# Patient Record
Sex: Female | Born: 1955 | Race: White | Hispanic: No | Smoking: Never smoker
Health system: Southern US, Community
[De-identification: ages and names within clinical notes are randomized; demographics above are authoritative.]

## PROBLEM LIST (undated history)

## (undated) DIAGNOSIS — E559 Vitamin D deficiency, unspecified: Secondary | ICD-10-CM

## (undated) DIAGNOSIS — K2970 Gastritis, unspecified, without bleeding: Secondary | ICD-10-CM

## (undated) DIAGNOSIS — T7840XA Allergy, unspecified, initial encounter: Secondary | ICD-10-CM

## (undated) DIAGNOSIS — F411 Generalized anxiety disorder: Secondary | ICD-10-CM

## (undated) DIAGNOSIS — K589 Irritable bowel syndrome without diarrhea: Secondary | ICD-10-CM

## (undated) DIAGNOSIS — M1990 Unspecified osteoarthritis, unspecified site: Secondary | ICD-10-CM

## (undated) DIAGNOSIS — E669 Obesity, unspecified: Secondary | ICD-10-CM

## (undated) DIAGNOSIS — K5790 Diverticulosis of intestine, part unspecified, without perforation or abscess without bleeding: Secondary | ICD-10-CM

## (undated) DIAGNOSIS — E039 Hypothyroidism, unspecified: Secondary | ICD-10-CM

## (undated) DIAGNOSIS — Z8669 Personal history of other diseases of the nervous system and sense organs: Secondary | ICD-10-CM

## (undated) DIAGNOSIS — B9681 Helicobacter pylori [H. pylori] as the cause of diseases classified elsewhere: Secondary | ICD-10-CM

## (undated) DIAGNOSIS — R0989 Other specified symptoms and signs involving the circulatory and respiratory systems: Secondary | ICD-10-CM

## (undated) DIAGNOSIS — K219 Gastro-esophageal reflux disease without esophagitis: Secondary | ICD-10-CM

## (undated) DIAGNOSIS — M797 Fibromyalgia: Secondary | ICD-10-CM

## (undated) DIAGNOSIS — M542 Cervicalgia: Secondary | ICD-10-CM

## (undated) DIAGNOSIS — G4700 Insomnia, unspecified: Secondary | ICD-10-CM

## (undated) HISTORY — DX: Cervicalgia: M54.2

## (undated) HISTORY — PX: BREAST ENHANCEMENT SURGERY: SHX7

## (undated) HISTORY — DX: Gastritis, unspecified, without bleeding: K29.70

## (undated) HISTORY — DX: Hypothyroidism, unspecified: E03.9

## (undated) HISTORY — DX: Helicobacter pylori (H. pylori) as the cause of diseases classified elsewhere: B96.81

## (undated) HISTORY — DX: Other specified symptoms and signs involving the circulatory and respiratory systems: R09.89

## (undated) HISTORY — DX: Allergy, unspecified, initial encounter: T78.40XA

## (undated) HISTORY — PX: TONSILLECTOMY: SUR1361

## (undated) HISTORY — DX: Unspecified osteoarthritis, unspecified site: M19.90

## (undated) HISTORY — PX: OTHER SURGICAL HISTORY: SHX169

## (undated) HISTORY — DX: Vitamin D deficiency, unspecified: E55.9

## (undated) HISTORY — DX: Personal history of other diseases of the nervous system and sense organs: Z86.69

## (undated) HISTORY — DX: Insomnia, unspecified: G47.00

## (undated) HISTORY — PX: COLONOSCOPY: SHX174

## (undated) HISTORY — DX: Irritable bowel syndrome, unspecified: K58.9

## (undated) HISTORY — DX: Generalized anxiety disorder: F41.1

## (undated) HISTORY — DX: Gastro-esophageal reflux disease without esophagitis: K21.9

## (undated) HISTORY — DX: Fibromyalgia: M79.7

## (undated) HISTORY — DX: Obesity, unspecified: E66.9

## (undated) HISTORY — DX: Diverticulosis of intestine, part unspecified, without perforation or abscess without bleeding: K57.90

---
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
## 1998-07-19 ENCOUNTER — Other Ambulatory Visit: Admission: RE | Admit: 1998-07-19 | Discharge: 1998-07-19 | Payer: Self-pay | Admitting: Radiology

## 1998-07-23 ENCOUNTER — Ambulatory Visit (HOSPITAL_COMMUNITY): Admission: RE | Admit: 1998-07-23 | Discharge: 1998-07-23 | Payer: Self-pay | Admitting: Gastroenterology

## 2002-03-13 ENCOUNTER — Other Ambulatory Visit: Admission: RE | Admit: 2002-03-13 | Discharge: 2002-03-13 | Payer: Self-pay | Admitting: *Deleted

## 2002-05-01 ENCOUNTER — Ambulatory Visit (HOSPITAL_COMMUNITY): Admission: RE | Admit: 2002-05-01 | Discharge: 2002-05-02 | Payer: Self-pay | Admitting: Surgery

## 2002-05-01 ENCOUNTER — Encounter (INDEPENDENT_AMBULATORY_CARE_PROVIDER_SITE_OTHER): Payer: Self-pay | Admitting: *Deleted

## 2003-03-26 ENCOUNTER — Other Ambulatory Visit: Admission: RE | Admit: 2003-03-26 | Discharge: 2003-03-26 | Payer: Self-pay | Admitting: *Deleted

## 2003-09-18 ENCOUNTER — Ambulatory Visit (HOSPITAL_COMMUNITY): Admission: RE | Admit: 2003-09-18 | Discharge: 2003-09-18 | Payer: Self-pay | Admitting: Gastroenterology

## 2004-03-29 ENCOUNTER — Other Ambulatory Visit: Admission: RE | Admit: 2004-03-29 | Discharge: 2004-03-29 | Payer: Self-pay | Admitting: Obstetrics and Gynecology

## 2005-03-02 ENCOUNTER — Ambulatory Visit: Payer: Self-pay | Admitting: Pulmonary Disease

## 2005-03-07 ENCOUNTER — Other Ambulatory Visit: Admission: RE | Admit: 2005-03-07 | Discharge: 2005-03-07 | Payer: Self-pay | Admitting: Obstetrics and Gynecology

## 2007-07-09 ENCOUNTER — Ambulatory Visit: Payer: Self-pay | Admitting: Pulmonary Disease

## 2007-07-09 LAB — CONVERTED CEMR LAB
ALT: 26 units/L (ref 0–35)
Albumin: 3.8 g/dL (ref 3.5–5.2)
BUN: 12 mg/dL (ref 6–23)
Basophils Relative: 0.9 % (ref 0.0–1.0)
Bilirubin, Direct: 0.2 mg/dL (ref 0.0–0.3)
Cholesterol: 192 mg/dL (ref 0–200)
Creatinine, Ser: 0.8 mg/dL (ref 0.4–1.2)
Eosinophils Relative: 2.1 % (ref 0.0–5.0)
GFR calc Af Amer: 96 mL/min
GFR calc non Af Amer: 80 mL/min
Glucose, Bld: 98 mg/dL (ref 70–99)
HDL: 54.2 mg/dL (ref >39.0)
Hemoglobin, Urine: NEGATIVE
MCHC: 34.2 g/dL (ref 30.0–36.0)
MCV: 92.2 fL (ref 78.0–100.0)
Monocytes Absolute: 0.5 10*3/uL (ref 0.2–0.7)
Monocytes Relative: 6.5 % (ref 3.0–11.0)
Mucus, UA: NEGATIVE
Potassium: 4.1 meq/L (ref 3.5–5.1)
RBC: 4.14 M/uL (ref 3.87–5.11)
Sodium: 140 meq/L (ref 135–145)
Specific Gravity, Urine: 1.01 (ref 1.000–1.03)
TSH: 2.74 microintl units/mL (ref 0.35–5.50)
Total CHOL/HDL Ratio: 3.5
Total Protein, Urine: NEGATIVE mg/dL
Total Protein: 6.5 g/dL (ref 6.0–8.3)
Urobilinogen, UA: 0.2 (ref 0.0–1.0)
VLDL: 20 mg/dL (ref 0–40)

## 2007-09-05 ENCOUNTER — Telehealth: Payer: Self-pay | Admitting: Pulmonary Disease

## 2007-09-06 ENCOUNTER — Encounter: Payer: Self-pay | Admitting: Pulmonary Disease

## 2007-09-06 DIAGNOSIS — K589 Irritable bowel syndrome without diarrhea: Secondary | ICD-10-CM | POA: Insufficient documentation

## 2007-09-06 DIAGNOSIS — IMO0001 Reserved for inherently not codable concepts without codable children: Secondary | ICD-10-CM | POA: Insufficient documentation

## 2007-09-06 DIAGNOSIS — Z8719 Personal history of other diseases of the digestive system: Secondary | ICD-10-CM | POA: Insufficient documentation

## 2007-09-06 DIAGNOSIS — F419 Anxiety disorder, unspecified: Secondary | ICD-10-CM

## 2007-09-27 ENCOUNTER — Telehealth (INDEPENDENT_AMBULATORY_CARE_PROVIDER_SITE_OTHER): Payer: Self-pay | Admitting: *Deleted

## 2007-10-07 ENCOUNTER — Encounter: Payer: Self-pay | Admitting: Pulmonary Disease

## 2007-10-30 ENCOUNTER — Encounter: Payer: Self-pay | Admitting: Pulmonary Disease

## 2007-10-31 ENCOUNTER — Encounter: Payer: Self-pay | Admitting: Pulmonary Disease

## 2007-11-08 ENCOUNTER — Telehealth (INDEPENDENT_AMBULATORY_CARE_PROVIDER_SITE_OTHER): Payer: Self-pay | Admitting: *Deleted

## 2007-11-08 ENCOUNTER — Ambulatory Visit: Payer: Self-pay | Admitting: Pulmonary Disease

## 2007-11-08 DIAGNOSIS — J069 Acute upper respiratory infection, unspecified: Secondary | ICD-10-CM | POA: Insufficient documentation

## 2008-01-28 ENCOUNTER — Encounter: Payer: Self-pay | Admitting: Pulmonary Disease

## 2008-02-12 ENCOUNTER — Telehealth (INDEPENDENT_AMBULATORY_CARE_PROVIDER_SITE_OTHER): Payer: Self-pay | Admitting: *Deleted

## 2008-02-20 ENCOUNTER — Ambulatory Visit: Payer: Self-pay | Admitting: Internal Medicine

## 2008-02-20 DIAGNOSIS — G4700 Insomnia, unspecified: Secondary | ICD-10-CM | POA: Insufficient documentation

## 2008-02-25 DIAGNOSIS — M542 Cervicalgia: Secondary | ICD-10-CM

## 2008-02-28 ENCOUNTER — Telehealth (INDEPENDENT_AMBULATORY_CARE_PROVIDER_SITE_OTHER): Payer: Self-pay | Admitting: *Deleted

## 2008-03-20 ENCOUNTER — Ambulatory Visit: Payer: Self-pay | Admitting: Internal Medicine

## 2008-03-30 ENCOUNTER — Telehealth (INDEPENDENT_AMBULATORY_CARE_PROVIDER_SITE_OTHER): Payer: Self-pay | Admitting: *Deleted

## 2008-03-31 ENCOUNTER — Telehealth: Payer: Self-pay | Admitting: Adult Health

## 2008-05-15 ENCOUNTER — Encounter: Payer: Self-pay | Admitting: Pulmonary Disease

## 2008-05-15 ENCOUNTER — Telehealth: Payer: Self-pay | Admitting: Pulmonary Disease

## 2008-06-02 ENCOUNTER — Encounter: Payer: Self-pay | Admitting: Pulmonary Disease

## 2008-06-28 ENCOUNTER — Encounter
Admission: RE | Admit: 2008-06-28 | Discharge: 2008-06-28 | Payer: Self-pay | Admitting: Physical Medicine and Rehabilitation

## 2008-08-25 ENCOUNTER — Encounter: Payer: Self-pay | Admitting: Pulmonary Disease

## 2010-02-09 ENCOUNTER — Telehealth (INDEPENDENT_AMBULATORY_CARE_PROVIDER_SITE_OTHER): Payer: Self-pay | Admitting: *Deleted

## 2010-03-07 ENCOUNTER — Telehealth: Payer: Self-pay | Admitting: Pulmonary Disease

## 2010-03-09 ENCOUNTER — Ambulatory Visit: Payer: Self-pay | Admitting: Pulmonary Disease

## 2010-03-09 DIAGNOSIS — E039 Hypothyroidism, unspecified: Secondary | ICD-10-CM | POA: Insufficient documentation

## 2010-03-13 DIAGNOSIS — G43909 Migraine, unspecified, not intractable, without status migrainosus: Secondary | ICD-10-CM | POA: Insufficient documentation

## 2010-03-13 DIAGNOSIS — R0989 Other specified symptoms and signs involving the circulatory and respiratory systems: Secondary | ICD-10-CM | POA: Insufficient documentation

## 2010-03-13 DIAGNOSIS — E042 Nontoxic multinodular goiter: Secondary | ICD-10-CM

## 2010-03-13 DIAGNOSIS — E559 Vitamin D deficiency, unspecified: Secondary | ICD-10-CM

## 2010-03-13 DIAGNOSIS — E785 Hyperlipidemia, unspecified: Secondary | ICD-10-CM

## 2010-03-13 LAB — CONVERTED CEMR LAB
AST: 16 units/L (ref 0–37)
Albumin: 4 g/dL (ref 3.5–5.2)
Alkaline Phosphatase: 52 units/L (ref 39–117)
BUN: 9 mg/dL (ref 6–23)
Basophils Absolute: 0 10*3/uL (ref 0.0–0.1)
Basophils Relative: 0.4 % (ref 0.0–3.0)
Chloride: 108 meq/L (ref 96–112)
Creatinine, Ser: 0.7 mg/dL (ref 0.4–1.2)
HCT: 36.5 % (ref 36.0–46.0)
Hemoglobin, Urine: NEGATIVE
Hemoglobin: 12.6 g/dL (ref 12.0–15.0)
Lymphs Abs: 2.7 10*3/uL (ref 0.7–4.0)
MCHC: 34.5 g/dL (ref 30.0–36.0)
Monocytes Relative: 7.1 % (ref 3.0–12.0)
Platelets: 195 10*3/uL (ref 150.0–400.0)
TSH: 1.56 microintl units/mL (ref 0.35–5.50)
Total Bilirubin: 1 mg/dL (ref 0.3–1.2)
Total Protein, Urine: NEGATIVE mg/dL
Total Protein: 6.1 g/dL (ref 6.0–8.3)
VLDL: 11.6 mg/dL (ref 0.0–40.0)
Vit D, 25-Hydroxy: 24 ng/mL — ABNORMAL LOW (ref 30–89)

## 2010-04-12 ENCOUNTER — Telehealth (INDEPENDENT_AMBULATORY_CARE_PROVIDER_SITE_OTHER): Payer: Self-pay | Admitting: *Deleted

## 2010-11-01 NOTE — Progress Notes (Signed)
Summary: set up labs  Phone Note Call from Patient Call back at Home Phone [phone]   Caller: Patient Call For: Majesty Stehlin Summary of Call: pt calling to have labs put in for cpx that is scheduled for wed.  Initial call taken by: Tivis Ringer, CNA,  March 07, 2010 11:39 AM  Follow-up for Phone Call        please advise on labs to enter. thanks.Carron Curie CMA  March 07, 2010 11:57 AM    called and lmom for pt to make her aware of labs placed in computer for 6-7---pt to call for any questions or concerns. Randell Loop CMA  March 07, 2010 4:45 PM

## 2010-11-01 NOTE — Assessment & Plan Note (Signed)
Summary: cpx/ mbw   CC:  32 month ROV & CPX....  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   Current Problems:   PHYSICAL EXAMINATION (ICD-V70.0):  ~  GI:  Prev saw DrWeissman, now [employer] & she will call to set up colonoscopy...  ~  GYN:  DrMeisinger, due for f/u GYN check up & Mammograms at [facility]...  ~  Immunizations:    CAROTID BRUIT (ICD-785.9) - faint carotid bruits on exam & CDoppler6/03 showed mild plaque w/o signif incr velocities, mild tortuosity may be source of bruit- 0-39% bilat ICA stenoses  HYPERCHOLESTEROLEMIA, BORDERLINE (ICD-272.4) - she has hx elevated LDL chol on diet alone, not able to exercise due to demands on her time from mother's illness...  ~  FLP 10/08 showed TChol 192, TG 99, HDL 54, LDL 118... discussed low fat diet.  ~  FLP 6/11 showed TChol 159, TG 58, HDL 56, LDL 92... improved on diet> continue same.  NONTOXIC MULTINODULAR GOITER (ICD-241.1), HYPOTHYROIDISM (ICD-244.9) - followed by DrSouth for Endocrinology on SYNTHROID 47mcg/d & CYTOMEL89mcg- 1/2 daily... she had a hemithyroidectomy at age 19 (1973) for a hot nodule...  OVERWEIGHT (ICD-278.02) - we discussed diet + exercise program...  ~  weight 10/08 = 229#  ~  weight 6/11 = 219#  IRRITABLE BOWEL SYNDROME, HX OF (ICD-V12.79) & Family Hx of COLON CANCER (ICD-153.9) - she was followed for yrs by Leodis Rains, now seeing DrGessner and f/u colonoscopy pending...  NECK PAIN (ICD-723.1) - eval by DrNewton et al w/ DDD on SKELAXIN 800mg  Tid Prn...  FIBROMYALGIA (ICD-729.1) - prev eval by Rheum- DrDeveshwar on Prn Skelaxin, Flector, Ambien... she notes "I do better off the whites"... she has had trochanteric bursitis w/ injection in the past... she's had coccyodynia in the past... she's been seen by [facility] in the past... hx left lateral malleolus fx 2008 after fall (DrBednarz)...  VITAMIN D DEFICIENCY (ICD-268.9) - prev on  Vit D 50K weekly per DrSouth, now on 1000 u daily OTC...   ~  labs 6/11 showed Vit D level = 24... rec> incr to 2000 u daily...  MIGRAINE HEADACHE (ICD-346.90) - uses RELPAX 20mg  Prn for migraine HA's...  ANXIETY (ICD-300.00) - on ATIVAN 1mg  Bid as needed for nerves... she is under considerable stress caring for her elderly mother...  INSOMNIA, CHRONIC (ICD-307.42)  DERM - hx mult dysplastic nevi removed by DrDJones...   Preventive Screening-Counseling & Management  Alcohol-Tobacco     Smoking Status: never  Allergies (verified): No Known Drug Allergies  Comments:  Nurse/Medical Assistant: The patient's medications and allergies were reviewed with the patient and were updated in the Medication and Allergy Lists.  Past History:  Past Medical History: CAROTID BRUIT (ICD-785.9) HYPERCHOLESTEROLEMIA, BORDERLINE (ICD-272.4) NONTOXIC MULTINODULAR GOITER (ICD-241.1) HYPOTHYROIDISM (ICD-244.9) OVERWEIGHT (ICD-278.02) IRRITABLE BOWEL SYNDROME, HX OF (ICD-V12.79) Family Hx of COLON CANCER (ICD-153.9) NECK PAIN (ICD-723.1) FIBROMYALGIA (ICD-729.1) VITAMIN D DEFICIENCY (ICD-268.9) MIGRAINE HEADACHE (ICD-346.90) ANXIETY (ICD-300.00) INSOMNIA, CHRONIC (ICD-307.42)  Past Surgical History: S/P right thyroidectomy 1973 for toxic nodule S/P breast implants  Family History: Reviewed history from 02/20/2008 and no changes required. Family history of allergies, asthma, and breast cancer in mother Father deceased at age 71 due to colon cancer Mother alive in her 32's w/ hx breast cancer 1 Sibling, sister, hx colon polyp  Social History: Reviewed history from 02/20/2008 and no changes required. 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  The patient denies fever, chills, sweats, anorexia, fatigue, weakness, malaise, weight loss, sleep disorder,  blurring, diplopia, eye irritation, eye discharge, vision loss, eye pain, photophobia, earache, ear discharge, tinnitus, decreased hearing, nasal congestion, nosebleeds, sore throat, hoarseness, chest pain, palpitations, syncope, dyspnea on exertion, orthopnea, PND, peripheral edema, cough, dyspnea at rest, excessive sputum, hemoptysis, wheezing, pleurisy, nausea, vomiting, diarrhea, constipation, change in bowel habits, abdominal pain, melena, hematochezia, jaundice, gas/bloating, indigestion/heartburn, dysphagia, odynophagia, dysuria, hematuria, urinary frequency, urinary hesitancy, nocturia, incontinence, back pain, joint pain, joint swelling, muscle cramps, muscle weakness, stiffness, arthritis, sciatica, restless legs, leg pain at night, leg pain with exertion, rash, itching, dryness, suspicious lesions, paralysis, paresthesias, seizures, tremors, vertigo, transient blindness, frequent falls, frequent headaches, difficulty walking, depression, memory loss, confusion, cold intolerance, heat intolerance, polydipsia, polyphagia, polyuria, unusual weight change, abnormal bruising, bleeding, enlarged lymph nodes, urticaria, allergic rash, hay fever, and recurrent infections.    Vital Signs:  Patient profile:   55 year old female Height:      67 inches Weight:      219 pounds BMI:     34.42 O2 Sat:      98 % on Room air Temp:     98.3 degrees F oral Pulse rate:   65 / minute BP sitting:   112 / 70  (left arm) Cuff size:   large  Vitals Entered By: Randell Loop CMA (March 09, 2010 11:15 AM)  O2 Sat at Rest %:  98 O2 Flow:  Room air CC: 32 month ROV & CPX... Is Patient Diabetic? No Pain Assessment Patient in pain? no      Comments meds updated today with pt   Physical Exam  Additional Exam:  WD, Overweight, 55 y/o WF in NAD... GENERAL:  Alert & oriented; pleasant & cooperative... HEENT:  Anthony/AT, EOM-wnl, PERRLA, Fundi-benign, EACs-clear, TMs-wnl, NOSE-clear, THROAT-clear & wnl. NECK:   Supple w/ full ROM; no JVD; normal carotid impulses w/ faint bilat CBruits; no thyromegaly or nodules palpated; no lymphadenopathy. CHEST:  Clear to P & A; without wheezes/ rales/ or rhonchi. HEART:  Regular Rhythm; without murmurs/ rubs/ or gallops. ABDOMEN:  Soft & nontender; normal bowel sounds; no organomegaly or masses detected. EXT: without deformities or arthritic changes; no varicose veins/ venous insuffic/ or edema. NEURO:  CN's intact; motor testing normal; sensory testing normal; gait normal & balance OK. DERM:  No lesions noted; no rash etc...    EKG  Procedure date:  03/09/2010  Findings:      Normal sinus rhythm with rate of:  60/min... Tracing is WNL, NAD...  SN   MISC. Report  Procedure date:  03/09/2010  Findings:      Lipid Panel (LIPID)   Cholesterol               159 mg/dL                   3-329   Triglycerides             58.0 mg/dL                  5.1-884.1   HDL                       66.06 mg/dL                 >  39.00   LDL Cholesterol           92 mg/dL                    0-45  BMP (METABOL)   Sodium                    141 mEq/L                   135-145   Potassium                 4.1 mEq/L                   3.5-5.1   Chloride                  108 mEq/L                   96-112   Carbon Dioxide            28 mEq/L                    19-32   Glucose                   88 mg/dL                    40-98   BUN                       9 mg/dL                     1-19   Creatinine                0.7 mg/dL                   1.4-7.8   Calcium                   9.3 mg/dL                   2.9-56.2   GFR                       91.99 mL/min                >60  Hepatic/Liver Function Panel (HEPATIC)   Total Bilirubin           1.0 mg/dL                   1.3-0.8   Direct Bilirubin          0.1 mg/dL                   6.5-7.8   Alkaline Phosphatase      52 U/L                      39-117   AST                       16 U/L                      0-37   ALT                        13 U/L  0-35   Total Protein             6.1 g/dL                    1.6-1.0   Albumin                   4.0 g/dL                    9.6-0.4  Comments:      CBC Platelet w/Diff (CBCD)   White Cell Count          5.4 K/uL                    4.5-10.5   Red Cell Count            3.92 Mil/uL                 3.87-5.11   Hemoglobin                12.6 g/dL                   54.0-98.1   Hematocrit                36.5 %                      36.0-46.0   MCV                       93.1 fl                     78.0-100.0   Platelet Count            195.0 K/uL                  150.0-400.0   Neutrophil %         [L]  41.6 %                      43.0-77.0   Lymphocyte %         [H]  49.6 %                      12.0-46.0   Monocyte %                7.1 %                       3.0-12.0   Eosinophils%              1.3 %                       0.0-5.0   Basophils %               0.4 %                       0.0-3.0  TSH (TSH)   FastTSH                   1.56 uIU/mL                 0.35-5.50  UDip Only (UDIP)   Color  LT. YELLOW   Clarity                   CLEAR                       Clear   Specific Gravity          1.015                       1.000 - 1.030   Urine Ph                  6.0                         5.0-8.0   Protein                   NEGATIVE                    Negative   Urine Glucose             NEGATIVE                    Negative   Ketones                   NEGATIVE                    Negative   Urine Bilirubin           NEGATIVE                    Negative   Blood                     NEGATIVE                    Negative   Urobilinogen              0.2                         0.0 - 1.0   Leukocyte Esterace        NEGATIVE                    Negative   Nitrite                   NEGATIVE       Impression & Recommendations:  Problem # 1:  PHYSICAL EXAMINATION (ICD-V70.0)  Orders: EKG w/ Interpretation (93000)  Problem # 2:   CAROTID BRUIT (ICD-785.9) On ASA 81mg /d... no ischemic symptoms etc...  Problem # 3:  HYPERCHOLESTEROLEMIA, BORDERLINE (ICD-272.4) FLP looks good on diet alone... discussed continued diet & wt reduction...  Problem # 4:  HYPOTHYROIDISM (ICD-244.9) Thyroid followed by [facility]South... Her updated medication list for this problem includes:    Synthroid 75 Mcg Tabs (Levothyroxine sodium) .Marland Kitchen... Take 1 tablet by mouth once a day    Cytomel 25 Mcg Tabs (Liothyronine sodium) .Marland Kitchen... 1/2 once daily  Problem # 5:  OVERWEIGHT (ICD-278.02) We discussed diet + exercise program... must get weight down...  Problem # 6:  Family Hx of COLON CANCER (ICD-153.9) Due for colonoscopy & she will f/u w/ DrGessner...  Problem # 7:  FIBROMYALGIA (ICD-729.1) She is doing reasonably well on meds and we discussed diet + exercise program... Her updated  medication list for this problem includes:    Skelaxin 800 Mg Tabs (Metaxalone) .Marland Kitchen... 1 by mouth three times a day as needed muscle spasm  Problem # 8:  ANXIETY (ICD-300.00) Stress w/ caring for Mom... Her updated medication list for this problem includes:    Ativan 1 Mg Tabs (Lorazepam) .Marland Kitchen... Take one tablet by mouth two times a day as needed for nerves  Complete Medication List: 1)  Synthroid 75 Mcg Tabs (Levothyroxine sodium) .... Take 1 tablet by mouth once a day 2)  Cytomel 25 Mcg Tabs (Liothyronine sodium) .... 1/2 once daily 3)  Calcium Carbonate-vitamin D 600-400 Mg-unit Tabs (Calcium carbonate-vitamin d) .... Take 1 tablet by mouth once a day 4)  Skelaxin 800 Mg Tabs (Metaxalone) .Marland Kitchen.. 1 by mouth three times a day as needed muscle spasm 5)  Relpax 20 Mg Tabs (Eletriptan hydrobromide) .... As directed 6)  Ativan 1 Mg Tabs (Lorazepam) .... Take one tablet by mouth two times a day as needed for nerves  Patient Instructions: 1)  Today we updated your med list- see below.... 2)  We refilled the Relpax & Lorazepam as requested... 3)  We also did your follow up  FASTING blood work... please call the "phone tree" in a few days for your lab results.Marland KitchenMarland Kitchen 4)  Let's get on track w/ diet + exercise... 5)  Take some time for [facility]!!! 6)  Call for any problems.Marland KitchenMarland Kitchen 7)  Please schedule a follow-up appointment in 1 year, sooner as needed... Prescriptions: ATIVAN 1 MG  TABS (LORAZEPAM) take one tablet by mouth two times a day as needed for nerves  #60 x 11   Entered and Authorized by:   Michele Mcalpine MD   Signed by:   Michele Mcalpine MD on 03/09/2010   Method used:   Print then Give to Patient   RxID:   [id number] RELPAX 20 MG  TABS (ELETRIPTAN HYDROBROMIDE) as directed  #9 x 11   Entered and Authorized by:   Michele Mcalpine MD   Signed by:   Michele Mcalpine MD on 03/09/2010   Method used:   Print then Give to Patient   RxID:   [id number]    CardioPerfect ECG  ID: [id number] Patient: Francine Graven DOB: 07/11/1956 Age: 55 Years Old Sex: Female Race: White Physician: Shayanna Thatch Technician: Randell Loop CMA Height: 67 Weight: 219 Status: Unconfirmed Past Medical History:  UPPER RESPIRATORY INFECTION (ICD-465.9) ANXIETY (ICD-300.00) FIBROMYALGIA (ICD-729.1) IRRITABLE BOWEL SYNDROME, HX OF (ICD-V12.79)   Recorded: 03/09/2010 11:37 AM P/PR: 108 ms / 175 ms - Heart rate (maximum exercise) QRS: 95 QT/QTc/QTd: 422 ms / 420 ms / 60 ms - Heart rate (maximum exercise)  P/QRS/T axis: 52 deg / 29 deg / 43 deg - Heart rate (maximum exercise)  Heartrate: 59 bpm  Interpretation:  Normal sinus rhythm with rate of:  60/min... Tracing is WNL, NAD...  SN

## 2010-11-01 NOTE — Progress Notes (Signed)
Summary: lab results  Phone Note Call from Patient Call back at Home Phone [phone]   Caller: Patient Call For: nadel Summary of Call: pt has called phone tree re: labs. these are not available. i have verified her ID # (MRN) as well as the ph# she called. she requests that nurse call her w/ reslults.  Initial call taken by: Tivis Ringer, CNA,  April 12, 2010 3:05 PM  Follow-up for Phone Call        Spoke with pt and advised of her lab results- rec that per SN, she increase the vitamin d to 2000units daily. Pt verbalized understanding. Follow-up by: Vernie Murders,  April 12, 2010 3:24 PM

## 2010-11-01 NOTE — Progress Notes (Signed)
  Phone Note Other Incoming   Request: Send information Summary of Call: [facility] medical record release received from patient requesting copies of her records from 2005-2010. Request forwarded to [employer].

## 2010-11-15 ENCOUNTER — Telehealth: Payer: Self-pay | Admitting: Pulmonary Disease

## 2010-11-23 NOTE — Progress Notes (Signed)
  Phone Note Refill Request Message from:  Fax from Pharmacy  Refills Requested: Medication #1:  ATIVAN 1 MG  TABS take one tablet by mouth two times a day as needed for nerves.   Supply Requested: 1 month Initial call taken by: Zackery Barefoot CMA,  November 15, 2010 10:11 AM    Prescriptions: ATIVAN 1 MG  TABS (LORAZEPAM) take one tablet by mouth two times a day as needed for nerves  #60 x 5   Entered by:   Zackery Barefoot CMA   Authorized by:   Michele Mcalpine MD   Signed by:   Zackery Barefoot CMA on 11/15/2010   Method used:   Telephoned to ...       [facility] [phone]* (retail)       [address]       [place], [place]  [address]       Ph: [phone]       Fax: [phone]   RxID:   [id number]

## 2011-01-31 LAB — BASIC METABOLIC PANEL WITH GFR
BUN: 13 mg/dL (ref 4–21)
Creatinine: 0.8 mg/dL (ref 0.5–1.1)
Glucose: 84 mg/dL
Potassium: 4.3 mmol/L (ref 3.4–5.3)
Sodium: 139 mmol/L (ref 137–147)

## 2011-01-31 LAB — HEPATIC FUNCTION PANEL
ALT: 26 U/L (ref 7–35)
AST: 19 U/L (ref 13–35)
Alkaline Phosphatase: 79 U/L (ref 25–125)
Bilirubin, Total: 0.4 mg/dL

## 2011-02-13 ENCOUNTER — Other Ambulatory Visit: Payer: Self-pay | Admitting: Endocrinology

## 2011-02-17 NOTE — Op Note (Signed)
NAME:  SHAYLEEN, EPPINGER NO.:  [id number]   MEDICAL RECORD NO.:  [id number]                   PATIENT TYPE:  OIB   LOCATION:  NA                                   FACILITY:  [facility]   PHYSICIAN:  Velora Heckler, M.D.                DATE OF BIRTH:  [birth year]   DATE OF PROCEDURE:  05/01/2002  DATE OF DISCHARGE:                                 OPERATIVE REPORT   PREOPERATIVE DIAGNOSIS:  Dominant left thyroid nodule, probable multinodular  goiter with mild hypothyroidism.   POSTOPERATIVE DIAGNOSIS:  Dominant left thyroid nodules, probable  multinodular goiter with mild hypothyroidism.   PROCEDURE:  Completion thyroidectomy.   SURGEON:  Velora Heckler, M.D.   ASSISTANT:  Sandria Bales. Ezzard Standing, M.D.   ANESTHESIA:  General per Burna Forts, M.D.   ESTIMATED BLOOD LOSS:  Minimal.   PREPARATION:  Betadine.   COMPLICATIONS:  None.   INDICATIONS:  The patient is a 55 year old white female with an interesting  endocrine history. She had undergone right thyroid lobectomy in 1972 by Dr.  Milus Mallick and Dr. Orpah Melter for goiter with spontaneous  hemorrhage into a thyroid cyst. The patient was placed on Synthroid.  However, the patient discontinued Synthroid at a later date. She became  mildly hyperthyroid. Nuclear medicine scan demonstrated an area of  photopenia in the upper pole of the left lobe consistent with cold nodule.  Ultrasound demonstrated a 3.2 cm, complex, cystic and solid mass in the  upper pole of the left lobe. The patient now comes to surgery for resection.   DESCRIPTION OF PROCEDURE:  The patient procedure was done in OR #17 at the  [facility] The patient was brought to the operating  room and placed in a supine position on the operating room table.  Following  the administration of general anesthesia, the patient was prepped and draped  in the usual strict aseptic fashion. After ascertaining that an  adequate  level of anesthesia had been obtained, an incision was made with a #10 blade  through the previous scar at the base of the neck. Dissection was carried  down through the subcutaneous tissues.  Hemostasis was obtained with the  electrocautery. Skin flaps were developed cephalad and caudad from the  thyroid notch through the sternal notch. External jugular veins are ligated  with 3-0 silk ties. A Mahorner self-retaining retractor was placed for  exposure. Strap muscles are incised in the midline with the electrocautery  and dissection is begun on the left side. No palpable abnormality is present  on the right. Strap muscles are reflected to the left. There is an enlarged  left lobe, which appears multinodular with both cystic and solid lesions.  Middle thyroid vein is divided between median and Ligaclip. Inferior venous  tributaries area divided between small Ligaclip. Inferior parathyroid gland  is identified on the surface  of the thyroid and preserved. There is a large  greater than 3 cm nodule in the left lobe superior pole.  With some  difficulty this is bluntly dissected away from the surrounding strap  muscles.  Superior pole vessels are ligated in continuity with 2-0 silk ties  and median Ligaclip and divided. The pole was rolled medially. With careful  dissection, the branches of the inferior thyroid artery are divided and the  gland is rolled up and onto the anterior surface of the trachea. Ligament of  Allyson Sabal is transected. Remaining isthmus of the gland is resected with the  left thyroid lobe. Specimen is passed off the field and submitted to  pathology for review. The left neck is irrigated copiously with warm saline.  Good hemostasis is obtained. Surgicel was placed in the left neck over the  area of the recurrent laryngeal nerve and parathyroid tissue. Strap muscles  were reapproximated in the midline with interrupted 3-0 Vicryl sutures.  Platysma was reapproximated  with interrupted 3-0 Vicryl sutures. Skin edges  were reapproximated with widely spaced stainless steel staples and  interspaced 1/2 Steri-Strips and Benzoin. Sterile dressings are applied. The  patient is awakened from anesthesia and brought to the recovery room in  stable condition. The patient tolerated the procedure well.                                               Velora Heckler, M.D.    TMG/MEDQ  D:  05/01/2002  T:  05/07/2002  Job:  [id number]   cc:   Jeannett Senior A. Evlyn Kanner, M.D.

## 2011-02-20 ENCOUNTER — Other Ambulatory Visit: Payer: Self-pay

## 2011-02-20 ENCOUNTER — Ambulatory Visit
Admission: RE | Admit: 2011-02-20 | Discharge: 2011-02-20 | Disposition: A | Discharge status: Home / Self Care | Payer: PRIVATE HEALTH INSURANCE | Source: Ambulatory Visit | Attending: Endocrinology | Admitting: Endocrinology

## 2011-02-23 ENCOUNTER — Encounter: Payer: Self-pay | Admitting: Pulmonary Disease

## 2011-03-06 ENCOUNTER — Ambulatory Visit: Payer: Self-pay | Admitting: Pulmonary Disease

## 2011-03-10 ENCOUNTER — Ambulatory Visit (INDEPENDENT_AMBULATORY_CARE_PROVIDER_SITE_OTHER): Payer: PRIVATE HEALTH INSURANCE | Admitting: Internal Medicine

## 2011-03-10 ENCOUNTER — Other Ambulatory Visit (INDEPENDENT_AMBULATORY_CARE_PROVIDER_SITE_OTHER): Payer: PRIVATE HEALTH INSURANCE

## 2011-03-10 ENCOUNTER — Encounter: Payer: Self-pay | Admitting: Internal Medicine

## 2011-03-10 VITALS — BP 102/72 | HR 79 | Temp 98.5°F | Ht 67.0 in | Wt 222.1 lb

## 2011-03-10 DIAGNOSIS — G43909 Migraine, unspecified, not intractable, without status migrainosus: Secondary | ICD-10-CM

## 2011-03-10 DIAGNOSIS — Z0000 Encounter for general adult medical examination without abnormal findings: Secondary | ICD-10-CM

## 2011-03-10 DIAGNOSIS — J309 Allergic rhinitis, unspecified: Secondary | ICD-10-CM

## 2011-03-10 DIAGNOSIS — E039 Hypothyroidism, unspecified: Secondary | ICD-10-CM

## 2011-03-10 DIAGNOSIS — IMO0001 Reserved for inherently not codable concepts without codable children: Secondary | ICD-10-CM

## 2011-03-10 LAB — LIPID PANEL
HDL: 58.4 mg/dL (ref >39.00)
Total CHOL/HDL Ratio: 3

## 2011-03-10 MED ORDER — SUMATRIPTAN SUCCINATE 50 MG PO TABS: 50.0000 mg | ORAL_TABLET | Freq: Once | ORAL | Status: DC | PRN

## 2011-03-10 MED ORDER — CLOBETASOL PROPIONATE 0.05 % EX CREA: TOPICAL_CREAM | Freq: Two times a day (BID) | CUTANEOUS | Status: DC

## 2011-03-10 MED ORDER — FLUTICASONE PROPIONATE 50 MCG/ACT NA SUSP: 1.0000 | Freq: Every day | NASAL | Status: DC

## 2011-03-10 NOTE — Patient Instructions (Signed)
It was good to see you today. We have reviewed your prior records including labs and tests today Test(s) ordered today. Your results will be called to you after review (48-72hours after test completion). If any changes need to be made, you will be notified at that time. we'll make referral to [facility] for colonoscopy. Our office will contact you regarding appointment(s) once made. You will arrange your gynecology, mammogram, eye appointment and dental follow up as discussed - call us if you need referrals Use Imitrex in place of Relpax as needed and Flonase for allergy symptoms - Your prescription(s) have been submitted to your pharmacy. Please take as directed and contact our office if you believe you are having problem(s) with the medication(s). Work on lifestyle changes as discussed (low fat, low carb, increased protein diet; improved exercise efforts; weight loss) to control sugar, blood pressure and cholesterol levels and/or reduce risk of developing other medical problems. Look into [email or web address] or other type of food journal to assist you in this process. Please schedule followup annually for medical physical/labs, call sooner if problems.

## 2011-03-10 NOTE — Progress Notes (Signed)
  Subjective:    Patient ID: Patricia Greene, female    DOB: [birth year], 55 y.o.   MRN: [id number]  HPI patient is here today for annual physical. Patient feels well and has no complaints. brought lab copy from 01/2011 done as part of employer work  Also reviewed chronic med issues- hypothyroid, FM, allg rhinitis, migraines Medications reviewed including cost issues and refill needs  Past Medical History  Diagnosis Date  . Other and unspecified hyperlipidemia   . Nontoxic multinodular goiter   . Unspecified hypothyroidism   . Malignant neoplasm of colon, unspecified site   . Cervicalgia   . Myalgia and myositis, unspecified   . Unspecified vitamin D deficiency   . Migraine, unspecified, without mention of intractable migraine without mention of status migrainosus   . Anxiety state, unspecified   . Persistent disorder of initiating or maintaining sleep   . Allergic rhinitis, cause unspecified    Family History  Problem Relation Age of Onset  . Asthma    . Breast cancer Mother   . Colon cancer Father   . Cancer Father    History  Substance Use Topics  . Smoking status: Never Smoker   . Smokeless tobacco: Not on file   Comment: Divorced, no children. 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 Alcohol Use: No    Review of Systems  Constitutional: Negative for fever.  Respiratory: Negative for cough and shortness of breath.   Cardiovascular: Negative for chest pain.  Gastrointestinal: Negative for abdominal pain.  Musculoskeletal: Negative for gait problem.  Skin: Negative for rash.  Neurological: Negative for dizziness.  No other specific complaints in a complete review of systems (except as listed in HPI above).     Objective:   Physical Exam BP 102/72  Pulse 79  Temp(Src) 98.5 F (36.9 C) (Oral)  Ht 5\' 7"  (1.702 m)  Wt 222 lb 1.9 oz (100.753 kg)  BMI 34.79 kg/m2  SpO2 95% Physical Exam  Constitutional: She is oriented to person, place, and time. She  appears well-developed and well-nourished. No distress.  HENT: Head: Normocephalic and atraumatic. Ears; B TMs ok, no erythema or effusion; Nose: Nose normal.  Mouth/Throat: Oropharynx is clear and moist. No oropharyngeal exudate.  Eyes: Conjunctivae and EOM are normal. Pupils are equal, round, and reactive to light. No scleral icterus.  Neck: Normal range of motion. Neck supple. No JVD present. No thyromegaly present.  Cardiovascular: Normal rate, regular rhythm and normal heart sounds.  No murmur heard. No BLE edema. Pulmonary/Chest: Effort normal and breath sounds normal. No respiratory distress. She has no wheezes.  Abdominal: Soft. Bowel sounds are normal. She exhibits no distension. There is no tenderness.  Musculoskeletal: Normal range of motion, no joint effusions. No gross deformities Neurological: She is alert and oriented to person, place, and time. No cranial nerve deficit. Coordination normal.  Skin: Skin is warm and dry. No rash noted. No erythema.  Psychiatric: She has a normal mood and affect. Her behavior is normal. Judgment and thought content normal.        Assessment & Plan:  See problem list. Medications and labs reviewed today.  CPx - v70.0 - Patient has been counseled on age-appropriate routine health concerns for screening and prevention. These are reviewed and up-to-date. Immunizations are up-to-date or declined. Labs ordered/reviewed and ECG reviewed: nsr without arrythmia/ischemia

## 2011-03-11 DIAGNOSIS — J309 Allergic rhinitis, unspecified: Secondary | ICD-10-CM | POA: Insufficient documentation

## 2011-03-11 NOTE — Assessment & Plan Note (Signed)
Follows with rheum ( s deveshwar)

## 2011-03-11 NOTE — Assessment & Plan Note (Signed)
relpax cost prohibitive - try imitrex generic prn. erx done

## 2011-03-11 NOTE — Assessment & Plan Note (Signed)
S/p resection - now total thyroidectomy since 2003 The current medical regimen is effective;  continue present plan and medications.

## 2011-03-11 NOTE — Assessment & Plan Note (Signed)
Add nasal steroid - erx done

## 2011-06-08 ENCOUNTER — Ambulatory Visit: Payer: PRIVATE HEALTH INSURANCE | Admitting: Internal Medicine

## 2011-06-26 ENCOUNTER — Ambulatory Visit: Payer: PRIVATE HEALTH INSURANCE | Admitting: Internal Medicine

## 2011-07-05 ENCOUNTER — Ambulatory Visit: Payer: PRIVATE HEALTH INSURANCE | Admitting: Internal Medicine

## 2011-07-14 ENCOUNTER — Ambulatory Visit (INDEPENDENT_AMBULATORY_CARE_PROVIDER_SITE_OTHER): Payer: PRIVATE HEALTH INSURANCE | Admitting: Internal Medicine

## 2011-07-14 ENCOUNTER — Encounter: Payer: Self-pay | Admitting: Internal Medicine

## 2011-07-14 VITALS — BP 132/76 | HR 88 | Ht 66.0 in | Wt 232.0 lb

## 2011-07-14 DIAGNOSIS — E669 Obesity, unspecified: Secondary | ICD-10-CM

## 2011-07-14 DIAGNOSIS — Z800 Family history of malignant neoplasm of digestive organs: Secondary | ICD-10-CM

## 2011-07-14 DIAGNOSIS — K589 Irritable bowel syndrome without diarrhea: Secondary | ICD-10-CM

## 2011-07-14 DIAGNOSIS — Z1211 Encounter for screening for malignant neoplasm of colon: Secondary | ICD-10-CM

## 2011-07-14 NOTE — Progress Notes (Signed)
  Subjective:    Patient ID: Patricia Greene, female    DOB: [birth year], 55 y.o.   MRN: [id number]  HPI Mild IBS - bloating at times but not a bother. Uses a probiotic yogurt dring with success. Last colonoscopy about 7 years ago - father with hx colon cancer dx age 18 and died age 54 Sister with colon polyps.  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

## 2011-07-14 NOTE — Patient Instructions (Signed)
Dr. Leone Payor request that you have a Colonoscopy, please call us back to schedule that at your earliest convenience.

## 2011-07-17 ENCOUNTER — Telehealth: Payer: Self-pay | Admitting: Internal Medicine

## 2011-07-17 NOTE — Telephone Encounter (Signed)
Patient called and talked with Vladimir Crofts about scheduling her Colon but was not satisfied with the dates that we had so she wanted to speak with a nurse about this. I called the patient to see about scheduling the procedure and she still was not happy, she got upset and started getting getting smart with my. Stated that she had to do this on the days she could do this or she couldn't do this at all. So we made her a pre-visit appointment on the morning her mother have her

## 2011-07-17 NOTE — Telephone Encounter (Signed)
.......   Continuation of earlier documentation We scheduled her a pre-visit appointment on 07/24/11 at 8:00 am, the morning her mother have her EGD at [facility]. Her Colonoscopy is scheduled for 08/03/11 at 2:00 pm [facility].

## 2011-07-24 ENCOUNTER — Ambulatory Visit (AMBULATORY_SURGERY_CENTER): Payer: PRIVATE HEALTH INSURANCE | Admitting: *Deleted

## 2011-07-24 DIAGNOSIS — Z1211 Encounter for screening for malignant neoplasm of colon: Secondary | ICD-10-CM

## 2011-07-24 DIAGNOSIS — Z800 Family history of malignant neoplasm of digestive organs: Secondary | ICD-10-CM

## 2011-07-24 MED ORDER — PEG-KCL-NACL-NASULF-NA ASC-C 100 G PO SOLR: 1.0000 | Freq: Once | ORAL | Status: DC

## 2011-07-24 NOTE — Progress Notes (Signed)
Release of information form filled out and given to [facility]

## 2011-07-25 ENCOUNTER — Encounter: Payer: Self-pay | Admitting: Internal Medicine

## 2011-07-25 ENCOUNTER — Telehealth: Payer: Self-pay | Admitting: Internal Medicine

## 2011-07-25 NOTE — Telephone Encounter (Signed)
Received 2pgs from Dr. Cordelia Pen ..forwarded to Dr. Leone Payor for review. 07/25/11-ar

## 2011-08-03 ENCOUNTER — Other Ambulatory Visit: Payer: PRIVATE HEALTH INSURANCE | Admitting: Internal Medicine

## 2011-08-09 ENCOUNTER — Encounter: Payer: Self-pay | Admitting: Internal Medicine

## 2011-08-09 ENCOUNTER — Ambulatory Visit (AMBULATORY_SURGERY_CENTER): Payer: PRIVATE HEALTH INSURANCE | Admitting: Internal Medicine

## 2011-08-09 VITALS — BP 133/73 | HR 72 | Temp 98.3°F | Resp 14 | Ht 66.0 in | Wt 228.0 lb

## 2011-08-09 DIAGNOSIS — Z800 Family history of malignant neoplasm of digestive organs: Secondary | ICD-10-CM

## 2011-08-09 DIAGNOSIS — Z1211 Encounter for screening for malignant neoplasm of colon: Secondary | ICD-10-CM

## 2011-08-09 DIAGNOSIS — K648 Other hemorrhoids: Secondary | ICD-10-CM

## 2011-08-09 MED ORDER — SODIUM CHLORIDE 0.9 % IV SOLN: 500.0000 mL | INTRAVENOUS | Status: DC

## 2011-08-09 NOTE — Patient Instructions (Addendum)
You had some hemorrhoids but otherwise the colonoscopy was normal. I recommend repeated routine colonoscopy in 5-7 years. We'll place a recall in for 5 years to send you a letter about that time. Her father did have colon cancer but given his age at that time it was found, your risk may not be increased much. Since it is somewhat of a gray zone I would have a followup in 5-7 years as stated. Iva Boop, MD, [facility]   Please follow the instruction sheets (green & blue sheets) the rest of the day.   You may resume your prior medications today.  Please call if you have any questions or concerns.

## 2011-08-09 NOTE — Progress Notes (Signed)
I assisted the pt with dressing.  No complaints noted in the recovery room. maw

## 2011-08-10 ENCOUNTER — Telehealth: Payer: Self-pay | Admitting: *Deleted

## 2011-08-10 NOTE — Telephone Encounter (Signed)
Voicemail message left

## 2011-08-18 ENCOUNTER — Encounter: Payer: Self-pay | Admitting: Internal Medicine

## 2011-08-18 ENCOUNTER — Ambulatory Visit (INDEPENDENT_AMBULATORY_CARE_PROVIDER_SITE_OTHER): Payer: PRIVATE HEALTH INSURANCE | Admitting: Internal Medicine

## 2011-08-18 VITALS — BP 110/80 | HR 73 | Temp 98.6°F | Ht 66.0 in | Wt 228.0 lb

## 2011-08-18 DIAGNOSIS — F411 Generalized anxiety disorder: Secondary | ICD-10-CM

## 2011-08-18 MED ORDER — LORAZEPAM 1 MG PO TABS: 1.0000 mg | ORAL_TABLET | Freq: Every evening | ORAL | Status: DC | PRN

## 2011-08-18 NOTE — Assessment & Plan Note (Signed)
symptoms stable, remotely on Prozac for PMS symptoms -  But declines SSRI at this time - ativan refills ok -

## 2011-08-18 NOTE — Patient Instructions (Signed)
It was good to see you today. Medications reviewed, no changes at this time. Refill on medication(s) as discussed today.

## 2011-08-18 NOTE — Progress Notes (Signed)
  Subjective:    Patient ID: Patricia Greene, female    DOB: 1956/09/03, 55 y.o.   MRN: [id number]  HPI here for follow up - reviewed chronic med issues- anxiety, hypothyroid, FM, allg rhinitis, migraines   Past Medical History  Diagnosis Date  . Hypothyroidism     nodular goiter  . Cervicalgia   . Fibromyalgia   . Unspecified vitamin D deficiency   . History of migraine headaches   . Anxiety state, unspecified   . Insomnia   . Allergic rhinitis   . Carotid bruit   . IBS (irritable bowel syndrome)   . Diverticulosis   . Obesity     Review of Systems  Constitutional: Negative for fever or unexpected weight change.  Musculoskeletal: Negative for gait problem or joint swelling.  Skin: Negative for rash.      Objective:   Physical Exam  BP 110/80  Pulse 73  Temp(Src) 98.6 F (37 C) (Oral)  Ht 5\' 6"  (1.676 m)  Wt 228 lb (103.42 kg)  BMI 36.80 kg/m2  SpO2 96% Wt Readings from Last 3 Encounters:  08/18/11 228 lb (103.42 kg)  08/09/11 228 lb (103.42 kg)  07/24/11 228 lb 11.2 oz (103.738 kg)   Constitutional: She is well-developed and well-nourished. No distress.  Neck: Normal range of motion. Neck supple. No JVD present. No thyromegaly present.  Cardiovascular: Normal rate, regular rhythm and normal heart sounds.  No murmur heard. No BLE edema. Pulmonary/Chest: Effort normal and breath sounds normal. No respiratory distress. She has no wheezes.  Psychiatric: She has a normal mood and affect. Her behavior is normal. Judgment and thought content normal.   Lab Results  Component Value Date   WBC 5.4 03/09/2010   HGB 12.6 03/09/2010   HCT 36.5 03/09/2010   PLT 195.0 03/09/2010   GLUCOSE 88 03/09/2010   CHOL 189 03/10/2011   TRIG 69.0 03/10/2011   HDL 58.40 03/10/2011   LDLCALC 117* 03/10/2011   ALT 26 01/31/2011   AST 19 01/31/2011   NA 139 01/31/2011   K 4.3 01/31/2011   CL 108 03/09/2010   CREATININE 0.8 01/31/2011   BUN 13 01/31/2011   CO2 28 03/09/2010   TSH 2.43 01/31/2011         Assessment & Plan:  See problem list. Medications and labs reviewed today.

## 2011-08-22 LAB — HM MAMMOGRAPHY

## 2011-08-23 ENCOUNTER — Encounter: Payer: Self-pay | Admitting: Internal Medicine

## 2011-08-30 ENCOUNTER — Encounter: Payer: Self-pay | Admitting: Internal Medicine

## 2011-09-08 ENCOUNTER — Ambulatory Visit (INDEPENDENT_AMBULATORY_CARE_PROVIDER_SITE_OTHER): Payer: PRIVATE HEALTH INSURANCE | Admitting: Internal Medicine

## 2011-09-08 ENCOUNTER — Encounter: Payer: Self-pay | Admitting: Internal Medicine

## 2011-09-08 VITALS — BP 120/82 | HR 88 | Temp 97.5°F | Wt 224.8 lb

## 2011-09-08 DIAGNOSIS — Z5189 Encounter for other specified aftercare: Secondary | ICD-10-CM

## 2011-09-08 DIAGNOSIS — J329 Chronic sinusitis, unspecified: Secondary | ICD-10-CM

## 2011-09-08 MED ORDER — FLUTICASONE PROPIONATE 50 MCG/ACT NA SUSP: 2.0000 | Freq: Every day | NASAL | Status: DC

## 2011-09-08 MED ORDER — AMOXICILLIN-POT CLAVULANATE 875-125 MG PO TABS: 1.0000 | ORAL_TABLET | Freq: Two times a day (BID) | ORAL | Status: AC

## 2011-09-08 NOTE — Patient Instructions (Signed)
The incision looks good, no evidence of infection. Continue dressing changes you're doing, Vaseline as needed and remove sutures as planned next week For your sinus issues, Augmentin twice daily for one week and Flonase for the next 30 days Your prescription(s) have been submitted to your pharmacy. Please take as directed and contact our office if you believe you are having problem(s) with the medication(s).

## 2011-09-08 NOTE — Progress Notes (Signed)
  Subjective:    Patient ID: Patricia Greene, female    DOB: 01/21/1956, 55 y.o.   MRN: [id number]  HPI  Complains of draining wound on shoulder Mole removed by dermatology w months ago - wider excision done last week No pain or fever  Only scant yellow drainage on dressing  Also complains of green sinus drainage x3 months Assessment mild sinus pressure maxillary greater than frontal Denies tooth pain, face pain, swelling or fever Not improved with over-the-counter medications for same  Past Medical History  Diagnosis Date  . Hypothyroidism     nodular goiter  . Cervicalgia   . Fibromyalgia   . Unspecified vitamin D deficiency   . History of migraine headaches   . Anxiety state, unspecified   . Insomnia   . Allergic rhinitis   . Carotid bruit   . IBS (irritable bowel syndrome)   . Diverticulosis   . Obesity     Review of Systems  Musculoskeletal: Negative for myalgias and joint swelling.  Skin: Negative for color change.  Neurological: Negative for dizziness, weakness and headaches.       Objective:   Physical Exam BP 120/82  Pulse 88  Temp(Src) 97.5 F (36.4 C) (Oral)  Wt 224 lb 12.8 oz (101.969 kg)  SpO2 97% General: No acute distress, nontoxic HEENT: Normocephalic atraumatic, mild tenderness to sinuses bilaterally. Nares with yellow discharge. Oropharynx clear without exudate Lungs: clear to auscultation bilaterally Cardiovascular: Regular rate and rhythm Skin: Well approximated incision approximately 1 inch in length on left posterior shoulder area - suture intact without erythema. No fluctuance, no erythema of skin. Trace serous yellow drainage on dressing but no expressible fluid. Nonpainful to palpation      Assessment & Plan:  Wound check left posterior shoulder -status post wide excision of atypical mole by dermatology at [facility] one week ago. Serous drainage - no infection - reassurance provided  Sinusitis, chronic - Augmentin bid x 1 week and flonase  - erx done

## 2012-03-04 ENCOUNTER — Other Ambulatory Visit: Payer: Self-pay | Admitting: *Deleted

## 2012-03-04 DIAGNOSIS — F411 Generalized anxiety disorder: Secondary | ICD-10-CM

## 2012-03-04 MED ORDER — LORAZEPAM 1 MG PO TABS: 1.0000 mg | ORAL_TABLET | Freq: Every evening | ORAL | Status: DC | PRN

## 2012-03-04 NOTE — Telephone Encounter (Signed)
Pt requesting refills on lorazepam. MD is out of office. Is this ok to refill?... 03/04/12@3 :59pm/LMB

## 2012-03-05 NOTE — Telephone Encounter (Signed)
[facility]... 03/05/12@8 :27am/LMB

## 2012-08-05 LAB — HM MAMMOGRAPHY

## 2012-09-09 ENCOUNTER — Telehealth: Payer: Self-pay | Admitting: Cardiovascular Disease

## 2012-09-09 NOTE — Telephone Encounter (Signed)
Pt rtn pat's call , pls call [phone] until 215pm

## 2012-09-09 NOTE — Telephone Encounter (Signed)
Pt was returning call regarding her mother--Patricia Greene--DOB [birth year]13/1935. I spoke with Patricia Greene and reviewed Patricia Greene lab results from 09/05/12 with her.

## 2012-09-15 ENCOUNTER — Other Ambulatory Visit: Payer: Self-pay | Admitting: Endocrinology

## 2012-09-18 ENCOUNTER — Other Ambulatory Visit: Payer: Self-pay | Admitting: Internal Medicine

## 2012-09-18 ENCOUNTER — Encounter: Payer: Self-pay | Admitting: Internal Medicine

## 2012-09-19 ENCOUNTER — Other Ambulatory Visit: Payer: Self-pay | Admitting: Internal Medicine

## 2012-09-19 DIAGNOSIS — F411 Generalized anxiety disorder: Secondary | ICD-10-CM

## 2012-09-19 MED ORDER — LORAZEPAM 1 MG PO TABS: 1.0000 mg | ORAL_TABLET | Freq: Every evening | ORAL | Status: DC | PRN

## 2012-09-19 NOTE — Telephone Encounter (Signed)
Pt called to ask when her refill will be sent.  She is going out of town this weekend.

## 2012-09-19 NOTE — Telephone Encounter (Signed)
Haven't received renewal from pharmacy. Called pt she is wanting refill on her lorazepam. Is this ok...lmb

## 2012-09-19 NOTE — Telephone Encounter (Signed)
ok 

## 2012-09-19 NOTE — Telephone Encounter (Signed)
Notified & faxed [facility].../lmb

## 2013-04-01 ENCOUNTER — Other Ambulatory Visit: Payer: Self-pay | Admitting: Internal Medicine

## 2013-04-02 ENCOUNTER — Telehealth: Payer: Self-pay | Admitting: *Deleted

## 2013-04-02 DIAGNOSIS — Z0000 Encounter for general adult medical examination without abnormal findings: Secondary | ICD-10-CM

## 2013-04-02 NOTE — Telephone Encounter (Signed)
Message copied by Deatra James on Wed Apr 02, 2013  9:20 AM ------      Message from: Livingston Diones      Created: Tue Apr 01, 2013  4:53 PM       Pt has a CPE appt 05/07/13. Please put lab work a week prior ------

## 2013-04-02 NOTE — Telephone Encounter (Signed)
Entered cpx ;abs...lmb

## 2013-04-28 LAB — HM PAP SMEAR

## 2013-05-01 ENCOUNTER — Other Ambulatory Visit (INDEPENDENT_AMBULATORY_CARE_PROVIDER_SITE_OTHER): Payer: PRIVATE HEALTH INSURANCE

## 2013-05-01 DIAGNOSIS — Z0000 Encounter for general adult medical examination without abnormal findings: Secondary | ICD-10-CM

## 2013-05-01 LAB — CBC WITH DIFFERENTIAL/PLATELET
Basophils Relative: 0.3 % (ref 0.0–3.0)
Eosinophils Absolute: 0.1 10*3/uL (ref 0.0–0.7)
Eosinophils Relative: 1.9 % (ref 0.0–5.0)
HCT: 37.9 % (ref 36.0–46.0)
Hemoglobin: 12.7 g/dL (ref 12.0–15.0)
Lymphs Abs: 2.8 10*3/uL (ref 0.7–4.0)
MCHC: 33.5 g/dL (ref 30.0–36.0)
MCV: 91 fl (ref 78.0–100.0)
Monocytes Absolute: 0.5 10*3/uL (ref 0.1–1.0)
Neutro Abs: 4 10*3/uL (ref 1.4–7.7)
Neutrophils Relative %: 53.9 % (ref 43.0–77.0)
RBC: 4.16 Mil/uL (ref 3.87–5.11)
WBC: 7.4 10*3/uL (ref 4.5–10.5)

## 2013-05-01 LAB — URINALYSIS, ROUTINE W REFLEX MICROSCOPIC
Hgb urine dipstick: NEGATIVE
Leukocytes, UA: NEGATIVE
Specific Gravity, Urine: 1.025 (ref 1.000–1.030)
Urine Glucose: NEGATIVE
Urobilinogen, UA: 0.2 (ref 0.0–1.0)

## 2013-05-01 LAB — BASIC METABOLIC PANEL
CO2: 27 mEq/L (ref 19–32)
Chloride: 105 mEq/L (ref 96–112)
Creatinine, Ser: 0.8 mg/dL (ref 0.4–1.2)
Potassium: 4.2 mEq/L (ref 3.5–5.1)

## 2013-05-01 LAB — HEPATIC FUNCTION PANEL
ALT: 19 U/L (ref 0–35)
Albumin: 3.9 g/dL (ref 3.5–5.2)
Bilirubin, Direct: 0.1 mg/dL (ref 0.0–0.3)
Total Protein: 7 g/dL (ref 6.0–8.3)

## 2013-05-01 LAB — LIPID PANEL
LDL Cholesterol: 98 mg/dL (ref 0–99)
Total CHOL/HDL Ratio: 3
Triglycerides: 49 mg/dL (ref 0.0–149.0)

## 2013-05-07 ENCOUNTER — Encounter: Payer: Self-pay | Admitting: Internal Medicine

## 2013-05-07 ENCOUNTER — Ambulatory Visit (INDEPENDENT_AMBULATORY_CARE_PROVIDER_SITE_OTHER): Payer: BC Managed Care – PPO | Admitting: Internal Medicine

## 2013-05-07 VITALS — BP 110/78 | HR 84 | Temp 98.2°F | Ht 66.0 in | Wt 220.4 lb

## 2013-05-07 DIAGNOSIS — Z136 Encounter for screening for cardiovascular disorders: Secondary | ICD-10-CM

## 2013-05-07 DIAGNOSIS — N951 Menopausal and female climacteric states: Secondary | ICD-10-CM

## 2013-05-07 DIAGNOSIS — Z13820 Encounter for screening for osteoporosis: Secondary | ICD-10-CM

## 2013-05-07 DIAGNOSIS — E669 Obesity, unspecified: Secondary | ICD-10-CM

## 2013-05-07 DIAGNOSIS — N959 Unspecified menopausal and perimenopausal disorder: Secondary | ICD-10-CM

## 2013-05-07 MED ORDER — IBUPROFEN 200 MG PO TABS: 400.0000 mg | ORAL_TABLET | Freq: Four times a day (QID) | ORAL | Status: DC | PRN

## 2013-05-07 MED ORDER — LORAZEPAM 1 MG PO TABS: 1.0000 mg | ORAL_TABLET | Freq: Two times a day (BID) | ORAL | Status: DC | PRN

## 2013-05-07 NOTE — Progress Notes (Signed)
Subjective:    Patient ID: Patricia Greene, female    DOB: [birth year], 57 y.o.   MRN: [id number]  HPI  patient is here today for annual physical. Patient feels well overall.  Also reviewed chronic med issues- anxiety, hypothyroid, FM, allg rhinitis, migraines  Past Medical History  Diagnosis Date  . Hypothyroidism     nodular goiter  . Cervicalgia   . Fibromyalgia   . Unspecified vitamin D deficiency   . History of migraine headaches   . Anxiety state, unspecified   . Insomnia   . Allergic rhinitis   . Carotid bruit   . IBS (irritable bowel syndrome)   . Diverticulosis   . Obesity    Family History  Problem Relation Age of Onset  . Asthma Mother   . Breast cancer Mother   . Allergic rhinitis Mother   . Heart disease Mother   . Colon cancer Father 34    died at 18  . Colon polyps Sister   . Esophageal cancer Neg Hx   . Stomach cancer Neg Hx    History  Substance Use Topics  . Smoking status: Never Smoker   . Smokeless tobacco: Never Used     Comment: Divorced, no children. 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 Alcohol Use: No     Comment: rare     Review of Systems Constitutional: Negative for fever or weight change.  Respiratory: Negative for cough and shortness of breath.   Cardiovascular: Negative for chest pain or palpitations.  Gastrointestinal: Negative for abdominal pain, no bowel changes.  Musculoskeletal: Negative for gait problem or joint swelling.  Skin: Negative for rash.  Neurological: Negative for dizziness or headache.  No other specific complaints in a complete review of systems (except as listed in HPI above).      Objective:   Physical Exam  BP 110/78  Pulse 84  Temp(Src) 98.2 F (36.8 C) (Oral)  Ht 5\' 6"  (1.676 m)  Wt 220 lb 6.4 oz (99.973 kg)  BMI 35.59 kg/m2  SpO2 95% Wt Readings from Last 3 Encounters:  05/07/13 220 lb 6.4 oz (99.973 kg)  09/08/11 224 lb 12.8 oz (101.969 kg)   08/18/11 228 lb (103.42 kg)   Constitutional: She is obese, but well-developed and well-nourished. No distress.  HENT: Head: Normocephalic and atraumatic. Ears: B TMs ok, no erythema or effusion; Nose: Nose normal. Mouth/Throat: Oropharynx is clear and moist. No oropharyngeal exudate.  Eyes: Conjunctivae and EOM are normal. Pupils are equal, round, and reactive to light. No scleral icterus.  Neck: Normal range of motion. Neck supple. No JVD present. No thyromegaly present.  Cardiovascular: Normal rate, regular rhythm and normal heart sounds.  No murmur heard. No BLE edema. Pulmonary/Chest: Effort normal and breath sounds normal. No respiratory distress. She has no wheezes.  Abdominal: Soft. Bowel sounds are normal. She exhibits no distension. There is no tenderness. no masses Musculoskeletal: Normal range of motion, no joint effusions. No gross deformities Neurological: She is alert and oriented to person, place, and time. No cranial nerve deficit. Coordination, balance, strength, speech and gait are normal.  Skin: Skin is warm and dry. No rash noted. No erythema.  Psychiatric: She has a normal mood and affect. Her behavior is normal. Judgment and thought content normal.    Lab Results  Component Value Date   WBC 7.4 05/01/2013   HGB 12.7 05/01/2013   HCT 37.9 05/01/2013   PLT 310.0 05/01/2013  GLUCOSE 88 05/01/2013   CHOL 157 05/01/2013   TRIG 49.0 05/01/2013   HDL 49.00 05/01/2013   LDLCALC 98 05/01/2013   ALT 19 05/01/2013   AST 17 05/01/2013   NA 139 05/01/2013   K 4.2 05/01/2013   CL 105 05/01/2013   CREATININE 0.8 05/01/2013   BUN 9 05/01/2013   CO2 27 05/01/2013   TSH 2.43 01/31/2011   ECG: sinus @ 81bpm, no ischemic change or arrythmia      Assessment & Plan:   CPX/v70.0 - Patient has been counseled on age-appropriate routine health concerns for screening and prevention. These are reviewed and up-to-date. Immunizations are up-to-date or declined. Labs and ECG reviewed.

## 2013-05-07 NOTE — Patient Instructions (Signed)
It was good to see you today. We have reviewed your prior records including labs and tests today Health Maintenance reviewed - all recommended immunizations and age-appropriate screenings are up-to-date. Test(s) ordered today - DEXA bone density at [facility] in November with mammo. Our office will contact you regarding appointment(s) once made. Work on lifestyle changes as discussed (low fat, low carb, increased protein diet; improved exercise efforts; weight loss) to control sugar, blood pressure and cholesterol levels and/or reduce risk of developing other medical problems. Look into [email or web address] or other type of food journal to assist you in this process. Please schedule followup in 12 months for annual CPX/labs, call sooner if problems.  Health Maintenance, Females A healthy lifestyle and preventative care can promote health and wellness.  Maintain regular health, dental, and eye exams.  Eat a healthy diet. Foods like vegetables, fruits, whole grains, low-fat dairy products, and lean protein foods contain the nutrients you need without too many calories. Decrease your intake of foods high in solid fats, added sugars, and salt. Get information about a proper diet from your caregiver, if necessary.  Regular physical exercise is one of the most important things you can do for your health. Most adults should get at least 150 minutes of moderate-intensity exercise (any activity that increases your heart rate and causes you to sweat) each week. In addition, most adults need muscle-strengthening exercises on 2 or more days a week.   Maintain a healthy weight. The body mass index (BMI) is a screening tool to identify possible weight problems. It provides an estimate of body fat based on height and weight. Your caregiver can help determine your BMI, and can help you achieve or maintain a healthy weight. For adults 20 years and older:  A BMI below 18.5 is considered underweight.  A BMI of 18.5 to 24.9  is normal.  A BMI of 25 to 29.9 is considered overweight.  A BMI of 30 and above is considered obese.  Maintain normal blood lipids and cholesterol by exercising and minimizing your intake of saturated fat. Eat a balanced diet with plenty of fruits and vegetables. Blood tests for lipids and cholesterol should begin at age 79 and be repeated every 5 years. If your lipid or cholesterol levels are high, you are over 50, or you are a high risk for heart disease, you may need your cholesterol levels checked more frequently.Ongoing high lipid and cholesterol levels should be treated with medicines if diet and exercise are not effective.  If you smoke, find out from your caregiver how to quit. If you do not use tobacco, do not start.  If you are pregnant, do not drink alcohol. If you are breastfeeding, be very cautious about drinking alcohol. If you are not pregnant and choose to drink alcohol, do not exceed 1 drink per day. One drink is considered to be 12 ounces (355 mL) of beer, 5 ounces (148 mL) of wine, or 1.5 ounces (44 mL) of liquor.  Avoid use of street drugs. Do not share needles with anyone. Ask for help if you need support or instructions about stopping the use of drugs.  High blood pressure causes heart disease and increases the risk of stroke. Blood pressure should be checked at least every 1 to 2 years. Ongoing high blood pressure should be treated with medicines, if weight loss and exercise are not effective.  If you are 97 to 59 years old, ask your caregiver if you should take aspirin to prevent strokes.  Diabetes  screening involves taking a blood sample to check your fasting blood sugar level. This should be done once every 3 years, after age 89, if you are within normal weight and without risk factors for diabetes. Testing should be considered at a younger age or be carried out more frequently if you are overweight and have at least 1 risk factor for diabetes.  Breast cancer screening  is essential preventative care for women. You should practice "breast self-awareness." This means understanding the normal appearance and feel of your breasts and may include breast self-examination. Any changes detected, no matter how small, should be reported to a caregiver. Women in their 64s and 30s should have a clinical breast exam (CBE) by a caregiver as part of a regular health exam every 1 to 3 years. After age 50, women should have a CBE every year. Starting at age 78, women should consider having a mammogram (breast X-ray) every year. Women who have a family history of breast cancer should talk to their caregiver about genetic screening. Women at a high risk of breast cancer should talk to their caregiver about having an MRI and a mammogram every year.  The Pap test is a screening test for cervical cancer. Women should have a Pap test starting at age 30. Between ages 43 and 19, Pap tests should be repeated every 2 years. Beginning at age 13, you should have a Pap test every 3 years as long as the past 3 Pap tests have been normal. If you had a hysterectomy for a problem that was not cancer or a condition that could lead to cancer, then you no longer need Pap tests. If you are between ages 25 and 70, and you have had normal Pap tests going back 10 years, you no longer need Pap tests. If you have had past treatment for cervical cancer or a condition that could lead to cancer, you need Pap tests and screening for cancer for at least 20 years after your treatment. If Pap tests have been discontinued, risk factors (such as a new sexual partner) need to be reassessed to determine if screening should be resumed. Some women have medical problems that increase the chance of getting cervical cancer. In these cases, your caregiver may recommend more frequent screening and Pap tests.  The human papillomavirus (HPV) test is an additional test that may be used for cervical cancer screening. The HPV test looks for  the virus that can cause the cell changes on the cervix. The cells collected during the Pap test can be tested for HPV. The HPV test could be used to screen women aged 45 years and older, and should be used in women of any age who have unclear Pap test results. After the age of 10, women should have HPV testing at the same frequency as a Pap test.  Colorectal cancer can be detected and often prevented. Most routine colorectal cancer screening begins at the age of 78 and continues through age 41. However, your caregiver may recommend screening at an earlier age if you have risk factors for colon cancer. On a yearly basis, your caregiver may provide home test kits to check for hidden blood in the stool. Use of a small camera at the end of a tube, to directly examine the colon (sigmoidoscopy or colonoscopy), can detect the earliest forms of colorectal cancer. Talk to your caregiver about this at age 50, when routine screening begins. Direct examination of the colon should be repeated every 5 to  10 years through age 85, unless early forms of pre-cancerous polyps or small growths are found.  Hepatitis C blood testing is recommended for all people born from [birth year] through 1967 and any individual with known risks for hepatitis C.  Practice safe sex. Use condoms and avoid high-risk sexual practices to reduce the spread of sexually transmitted infections (STIs). Sexually active women aged 62 and younger should be checked for Chlamydia, which is a common sexually transmitted infection. Older women with new or multiple partners should also be tested for Chlamydia. Testing for other STIs is recommended if you are sexually active and at increased risk.  Osteoporosis is a disease in which the bones lose minerals and strength with aging. This can result in serious bone fractures. The risk of osteoporosis can be identified using a bone density scan. Women ages 30 and over and women at risk for fractures or osteoporosis should  discuss screening with their caregivers. Ask your caregiver whether you should be taking a calcium supplement or vitamin D to reduce the rate of osteoporosis.  Menopause can be associated with physical symptoms and risks. Hormone replacement therapy is available to decrease symptoms and risks. You should talk to your caregiver about whether hormone replacement therapy is right for you.  Use sunscreen with a sun protection factor (SPF) of 30 or greater. Apply sunscreen liberally and repeatedly throughout the day. You should seek shade when your shadow is shorter than you. Protect yourself by wearing long sleeves, pants, a wide-brimmed hat, and sunglasses year round, whenever you are outdoors.  Notify your caregiver of new moles or changes in moles, especially if there is a change in shape or color. Also notify your caregiver if a mole is larger than the size of a pencil eraser.  Stay current with your immunizations. Document Released: 04/03/2011 Document Revised: 12/11/2011 Document Reviewed: 04/03/2011 [facility] Patient Information 2014 [place], [place]. Exercise to Lose Weight Exercise and a healthy diet may help you lose weight. Your doctor may suggest specific exercises. EXERCISE IDEAS AND TIPS  Choose low-cost things you enjoy doing, such as walking, bicycling, or exercising to workout videos.  Take stairs instead of the elevator.  Walk during your lunch break.  Park your car further away from work or school.  Go to a gym or an exercise class.  Start with 5 to 10 minutes of exercise each day. Build up to 30 minutes of exercise 4 to 6 days a week.  Wear shoes with good support and comfortable clothes.  Stretch before and after working out.  Work out until you breathe harder and your heart beats faster.  Drink extra water when you exercise.  Do not do so much that you hurt yourself, feel dizzy, or get very short of breath. Exercises that burn about 150 calories:  Running 1   miles in 15 minutes.  Playing volleyball for 45 to 60 minutes.  Washing and waxing a car for 45 to 60 minutes.  Playing touch football for 45 minutes.  Walking 1  miles in 35 minutes.  Pushing a stroller 1  miles in 30 minutes.  Playing basketball for 30 minutes.  Raking leaves for 30 minutes.  Bicycling 5 miles in 30 minutes.  Walking 2 miles in 30 minutes.  Dancing for 30 minutes.  Shoveling snow for 15 minutes.  Swimming laps for 20 minutes.  Walking up stairs for 15 minutes.  Bicycling 4 miles in 15 minutes.  Gardening for 30 to 45 minutes.  Jumping rope for  15 minutes.  Washing windows or floors for 45 to 60 minutes. Document Released: 10/21/2010 Document Revised: 12/11/2011 Document Reviewed: 10/21/2010 [facility] Patient Information 2014 [place], [place]. Exercise to Lose Weight Exercise and a healthy diet may help you lose weight. Your doctor may suggest specific exercises. EXERCISE IDEAS AND TIPS  Choose low-cost things you enjoy doing, such as walking, bicycling, or exercising to workout videos.  Take stairs instead of the elevator.  Walk during your lunch break.  Park your car further away from work or school.  Go to a gym or an exercise class.  Start with 5 to 10 minutes of exercise each day. Build up to 30 minutes of exercise 4 to 6 days a week.  Wear shoes with good support and comfortable clothes.  Stretch before and after working out.  Work out until you breathe harder and your heart beats faster.  Drink extra water when you exercise.  Do not do so much that you hurt yourself, feel dizzy, or get very short of breath. Exercises that burn about 150 calories:  Running 1  miles in 15 minutes.  Playing volleyball for 45 to 60 minutes.  Washing and waxing a car for 45 to 60 minutes.  Playing touch football for 45 minutes.  Walking 1  miles in 35 minutes.  Pushing a stroller 1  miles in 30 minutes.  Playing basketball for 30  minutes.  Raking leaves for 30 minutes.  Bicycling 5 miles in 30 minutes.  Walking 2 miles in 30 minutes.  Dancing for 30 minutes.  Shoveling snow for 15 minutes.  Swimming laps for 20 minutes.  Walking up stairs for 15 minutes.  Bicycling 4 miles in 15 minutes.  Gardening for 30 to 45 minutes.  Jumping rope for 15 minutes.  Washing windows or floors for 45 to 60 minutes. Document Released: 10/21/2010 Document Revised: 12/11/2011 Document Reviewed: 10/21/2010 [facility] Patient Information 2014 [place], [place].

## 2013-05-09 ENCOUNTER — Telehealth: Payer: Self-pay | Admitting: *Deleted

## 2013-05-09 NOTE — Telephone Encounter (Signed)
Pt call stating pharmacy states they didn't received lorazepam had appt on 05/07/13. Called [facility] spoke with Paula/pharmacist verify if they received script that was fax on Tues. Gunnar Fusi states they didn't received. Gave verbal authorization for lorazepam.../lmb

## 2013-08-07 ENCOUNTER — Other Ambulatory Visit: Payer: Self-pay

## 2013-10-14 ENCOUNTER — Ambulatory Visit: Payer: BC Managed Care – PPO | Admitting: Internal Medicine

## 2013-11-24 ENCOUNTER — Telehealth: Payer: Self-pay | Admitting: Internal Medicine

## 2013-11-24 NOTE — Telephone Encounter (Signed)
Mrs grade returning mindy's call availabe to take up until 2:20 or can be reached after 4.Hillery Hunter

## 2013-11-24 NOTE — Telephone Encounter (Signed)
She was calling in regards to her mother. Not herself.

## 2013-11-30 ENCOUNTER — Other Ambulatory Visit: Payer: Self-pay | Admitting: Internal Medicine

## 2013-12-01 NOTE — Telephone Encounter (Signed)
See Izora Gala note under comment ok x 30 days. [facility].../lmb

## 2013-12-01 NOTE — Telephone Encounter (Signed)
MD out of office this week. Pls advise on refill.../lmb 

## 2014-01-02 ENCOUNTER — Other Ambulatory Visit: Payer: Self-pay | Admitting: Physician Assistant

## 2014-01-06 ENCOUNTER — Other Ambulatory Visit: Payer: Self-pay | Admitting: *Deleted

## 2014-01-06 NOTE — Telephone Encounter (Signed)
Ok -Please print and I will sign -

## 2014-01-06 NOTE — Telephone Encounter (Signed)
Pt requesting Lorazepam Refill.  Please advise

## 2014-01-07 MED ORDER — LORAZEPAM 1 MG PO TABS: 1.0000 mg | ORAL_TABLET | Freq: Two times a day (BID) | ORAL | Status: DC | PRN

## 2014-04-17 LAB — HM MAMMOGRAPHY

## 2014-05-05 ENCOUNTER — Telehealth: Payer: Self-pay | Admitting: Internal Medicine

## 2014-05-05 NOTE — Telephone Encounter (Signed)
error 

## 2014-05-13 ENCOUNTER — Encounter: Payer: Self-pay | Admitting: Internal Medicine

## 2014-05-13 ENCOUNTER — Telehealth: Payer: Self-pay

## 2014-05-13 ENCOUNTER — Ambulatory Visit (INDEPENDENT_AMBULATORY_CARE_PROVIDER_SITE_OTHER): Payer: BC Managed Care – PPO | Admitting: Internal Medicine

## 2014-05-13 ENCOUNTER — Other Ambulatory Visit (INDEPENDENT_AMBULATORY_CARE_PROVIDER_SITE_OTHER): Payer: BC Managed Care – PPO

## 2014-05-13 VITALS — BP 114/75 | HR 85 | Temp 98.1°F | Ht 66.0 in | Wt 214.0 lb

## 2014-05-13 DIAGNOSIS — Z0000 Encounter for general adult medical examination without abnormal findings: Secondary | ICD-10-CM

## 2014-05-13 DIAGNOSIS — Z1159 Encounter for screening for other viral diseases: Secondary | ICD-10-CM

## 2014-05-13 LAB — HEPATITIS C ANTIBODY: HCV AB: NEGATIVE

## 2014-05-13 LAB — URINALYSIS, ROUTINE W REFLEX MICROSCOPIC
Bilirubin Urine: NEGATIVE
Hgb urine dipstick: NEGATIVE
Ketones, ur: NEGATIVE
Leukocytes, UA: NEGATIVE
Nitrite: NEGATIVE
PH: 7.5 (ref 5.0–8.0)
RBC / HPF: NONE SEEN (ref 0–?)
Specific Gravity, Urine: 1.01 (ref 1.000–1.030)
Total Protein, Urine: NEGATIVE
URINE GLUCOSE: NEGATIVE
Urobilinogen, UA: 0.2 (ref 0.0–1.0)

## 2014-05-13 LAB — CBC WITH DIFFERENTIAL/PLATELET
BASOS PCT: 0.4 % (ref 0.0–3.0)
Basophils Absolute: 0 10*3/uL (ref 0.0–0.1)
EOS ABS: 0.1 10*3/uL (ref 0.0–0.7)
Eosinophils Relative: 1.5 % (ref 0.0–5.0)
HEMATOCRIT: 39.6 % (ref 36.0–46.0)
HEMOGLOBIN: 13.4 g/dL (ref 12.0–15.0)
Lymphocytes Relative: 40.1 % (ref 12.0–46.0)
Lymphs Abs: 2.1 10*3/uL (ref 0.7–4.0)
MCHC: 33.7 g/dL (ref 30.0–36.0)
MCV: 92.6 fl (ref 78.0–100.0)
Monocytes Absolute: 0.4 10*3/uL (ref 0.1–1.0)
Monocytes Relative: 7.6 % (ref 3.0–12.0)
NEUTROS ABS: 2.6 10*3/uL (ref 1.4–7.7)
Neutrophils Relative %: 50.4 % (ref 43.0–77.0)
Platelets: 210 10*3/uL (ref 150.0–400.0)
RBC: 4.28 Mil/uL (ref 3.87–5.11)
RDW: 15 % (ref 11.5–15.5)
WBC: 5.2 10*3/uL (ref 4.0–10.5)

## 2014-05-13 LAB — BASIC METABOLIC PANEL
BUN: 16 mg/dL (ref 6–23)
CALCIUM: 9.6 mg/dL (ref 8.4–10.5)
CO2: 28 meq/L (ref 19–32)
Chloride: 105 mEq/L (ref 96–112)
Creatinine, Ser: 0.8 mg/dL (ref 0.4–1.2)
GFR: 82.45 mL/min (ref >60.00)
Glucose, Bld: 90 mg/dL (ref 70–99)
POTASSIUM: 3.9 meq/L (ref 3.5–5.1)
SODIUM: 139 meq/L (ref 135–145)

## 2014-05-13 LAB — LIPID PANEL
Cholesterol: 167 mg/dL (ref 0–200)
HDL: 56.2 mg/dL (ref >39.00)
LDL Cholesterol: 100 mg/dL — ABNORMAL HIGH (ref 0–99)
NONHDL: 110.8
Total CHOL/HDL Ratio: 3
Triglycerides: 53 mg/dL (ref 0.0–149.0)
VLDL: 10.6 mg/dL (ref 0.0–40.0)

## 2014-05-13 LAB — HEPATIC FUNCTION PANEL
ALBUMIN: 4.2 g/dL (ref 3.5–5.2)
ALT: 16 U/L (ref 0–35)
AST: 17 U/L (ref 0–37)
Alkaline Phosphatase: 57 U/L (ref 39–117)
Bilirubin, Direct: 0.1 mg/dL (ref 0.0–0.3)
TOTAL PROTEIN: 6.7 g/dL (ref 6.0–8.3)
Total Bilirubin: 1 mg/dL (ref 0.2–1.2)

## 2014-05-13 NOTE — Progress Notes (Signed)
Subjective:    Patient ID: Patricia Greene, female    DOB: Dec 31, 1955, 58 y.o.   MRN: [id number]  HPI  patient is here today for annual physical. Patient feels well and has no complaints.  Also reviewed chronic medical issues and interval medical events  Past Medical History  Diagnosis Date  . Hypothyroidism     nodular goiter  . Cervicalgia   . Fibromyalgia   . Unspecified vitamin D deficiency   . History of migraine headaches   . Anxiety state, unspecified   . Insomnia   . Allergic rhinitis   . Carotid bruit   . IBS (irritable bowel syndrome)   . Diverticulosis   . Obesity    Family History  Problem Relation Age of Onset  . Asthma Mother   . Breast cancer Mother   . Allergic rhinitis Mother   . Heart disease Mother   . Colon cancer Father 10    died at 61  . Colon polyps Sister   . Esophageal cancer Neg Hx   . Stomach cancer Neg Hx    History  Substance Use Topics  . Smoking status: Never Smoker   . Smokeless tobacco: Never Used  . Alcohol Use: No     Comment: rare     Review of Systems  Constitutional: Negative for fatigue and unexpected weight change.  Respiratory: Negative for cough, shortness of breath and wheezing.   Cardiovascular: Negative for chest pain, palpitations and leg swelling.  Gastrointestinal: Negative for nausea, abdominal pain and diarrhea.  Musculoskeletal: Positive for back pain (pain and spasm since 02/25/14 strain, working with ortho spine (coehn) and OT on same, improving).  Neurological: Negative for dizziness, weakness, light-headedness and headaches.  Psychiatric/Behavioral: Negative for dysphoric mood. The patient is not nervous/anxious.   All other systems reviewed and are negative.      Objective:   Physical Exam  BP 114/75  Pulse 85  Temp(Src) 98.1 F (36.7 C) (Oral)  Ht 5\' 6"  (1.676 m)  Wt 214 lb (97.07 kg)  BMI 34.56 kg/m2  SpO2 95% Wt Readings from Last 3 Encounters:  05/13/14 214 lb (97.07 kg)  05/07/13 220  lb 6.4 oz (99.973 kg)  09/08/11 224 lb 12.8 oz (101.969 kg)   Constitutional: She is overweight, and appears well-developed and well-nourished. No distress.  HENT: Head: Normocephalic and atraumatic. Ears: B TMs ok, no erythema or effusion; Nose: Nose normal. Mouth/Throat: Oropharynx is clear and moist. No oropharyngeal exudate.  Eyes: Conjunctivae and EOM are normal. Pupils are equal, round, and reactive to light. No scleral icterus.  Neck: Normal range of motion. Neck supple. No JVD present. No thyromegaly present.  Cardiovascular: Normal rate, regular rhythm and normal heart sounds.  No murmur heard. No BLE edema. Pulmonary/Chest: Effort normal and breath sounds normal. No respiratory distress. She has no wheezes.  Abdominal: Soft. Bowel sounds are normal. She exhibits no distension. There is no tenderness. no masses GU/breast: defer to gyn Musculoskeletal: Normal range of motion, no joint effusions. No gross deformities Neurological: She is alert and oriented to person, place, and time. No cranial nerve deficit. Coordination, balance, strength, speech and gait are normal.  Skin: Skin is warm and dry. No rash noted. No erythema.  Psychiatric: She has a normal mood and affect. Her behavior is normal. Judgment and thought content normal.    Lab Results  Component Value Date   WBC 7.4 05/01/2013   HGB 12.7 05/01/2013   HCT 37.9 05/01/2013   PLT  310.0 05/01/2013   GLUCOSE 88 05/01/2013   CHOL 157 05/01/2013   TRIG 49.0 05/01/2013   HDL 49.00 05/01/2013   LDLCALC 98 05/01/2013   ALT 19 05/01/2013   AST 17 05/01/2013   NA 139 05/01/2013   K 4.2 05/01/2013   CL 105 05/01/2013   CREATININE 0.8 05/01/2013   BUN 9 05/01/2013   CO2 27 05/01/2013   TSH 2.43 01/31/2011    US Carotid Duplex Bilateral  02/20/2011   *RADIOLOGY REPORT*  Clinical Data: Asymptomatic carotid bruit  BILATERAL CAROTID DUPLEX ULTRASOUND  Technique: Pearline Cables scale imaging, color Doppler and duplex ultrasound was performed of bilateral  carotid and vertebral arteries in the neck.  Comparison:  None.  Criteria:  Quantification of carotid stenosis is based on velocity parameters that correlate the residual internal carotid diameter with NASCET-based stenosis levels, using the diameter of the distal internal carotid lumen as the denominator for stenosis measurement.  The following velocity measurements were obtained:                   PEAK SYSTOLIC/END DIASTOLIC RIGHT ICA:                        159/55cm/sec CCA:                        03/50KX/FGH SYSTOLIC ICA/CCA RATIO:     8.29 DIASTOLIC ICA/CCA RATIO:    2.12 ECA:                        78cm/sec  LEFT ICA:                        93/34cm/sec CCA:                        93/71IR/CVE SYSTOLIC ICA/CCA RATIO:     9.38 DIASTOLIC ICA/CCA RATIO:    1.59 ECA:                        72cm/sec  Findings:  RIGHT CAROTID ARTERY: No significant carotid atherosclerosis.  Mild tortuosity of the right ICA.  No hemodynamically significant ICA stenosis, velocity elevation, or turbulent flow.  RIGHT VERTEBRAL ARTERY:  Antegrade  LEFT CAROTID ARTERY: No significant carotid atherosclerosis.  Mild left ICA tortuosity.  No hemodynamically significant ICA stenosis, velocity elevation, or turbulent flow.  LEFT VERTEBRAL ARTERY:  Antegrade  IMPRESSION: No significant carotid atherosclerosis.  No hemodynamically significant ICA stenosis on either side.  Original Report Authenticated By: Jerilynn Mages. Daryll Brod, M.D.      Assessment & Plan:   CPX/v70.0 - Patient has been counseled on age-appropriate routine health concerns for screening and prevention. These are reviewed and up-to-date. Immunizations are up-to-date or declined. Labs ordered and reviewed.

## 2014-05-13 NOTE — Telephone Encounter (Signed)
Noted I have cancelled order thanks

## 2014-05-13 NOTE — Progress Notes (Signed)
Pre visit review using our clinic review tool, if applicable. No additional management support is needed unless otherwise documented below in the visit note. 

## 2014-05-13 NOTE — Patient Instructions (Addendum)
It was good to see you today.  We have reviewed your prior records including labs and tests today  Health Maintenance reviewed - consider Zostavax (shingles) as discussed, talk with your insurance - all other recommended immunizations and age-appropriate screenings are up-to-date.  Test(s) ordered today. Your results will be released to Guy (or called to you) after review, usually within 72hours after test completion. If any changes need to be made, you will be notified at that same time.  Medications reviewed and updated, no changes recommended at this time.  Please schedule followup in 12 months for annual exam and labs, call sooner if problems.  Health Maintenance Adopting a healthy lifestyle and getting preventive care can go a long way to promote health and wellness. Talk with your health care provider about what schedule of regular examinations is right for you. This is a good chance for you to check in with your provider about disease prevention and staying healthy. In between checkups, there are plenty of things you can do on your own. Experts have done a lot of research about which lifestyle changes and preventive measures are most likely to keep you healthy. Ask your health care provider for more information. WEIGHT AND DIET  Eat a healthy diet  Be sure to include plenty of vegetables, fruits, low-fat dairy products, and lean protein.  Do not eat a lot of foods high in solid fats, added sugars, or salt.  Get regular exercise. This is one of the most important things you can do for your health.  Most adults should exercise for at least 150 minutes each week. The exercise should increase your heart rate and make you sweat (moderate-intensity exercise).  Most adults should also do strengthening exercises at least twice a week. This is in addition to the moderate-intensity exercise.  Maintain a healthy weight  Body mass index (BMI) is a measurement that can be used to identify  possible weight problems. It estimates body fat based on height and weight. Your health care provider can help determine your BMI and help you achieve or maintain a healthy weight.  For females 54 years of age and older:   A BMI below 18.5 is considered underweight.  A BMI of 18.5 to 24.9 is normal.  A BMI of 25 to 29.9 is considered overweight.  A BMI of 30 and above is considered obese.  Watch levels of cholesterol and blood lipids  You should start having your blood tested for lipids and cholesterol at 60 years of age, then have this test every 5 years.  You may need to have your cholesterol levels checked more often if:  Your lipid or cholesterol levels are high.  You are older than 60 years of age.  You are at high risk for heart disease.  CANCER SCREENING   Lung Cancer  Lung cancer screening is recommended for adults 83-19 years old who are at high risk for lung cancer because of a history of smoking.  A yearly low-dose CT scan of the lungs is recommended for people who:  Currently smoke.  Have quit within the past 15 years.  Have at least a 30-pack-year history of smoking. A pack year is smoking an average of one pack of cigarettes a day for 1 year.  Yearly screening should continue until it has been 15 years since you quit.  Yearly screening should stop if you develop a health problem that would prevent you from having lung cancer treatment.  Breast Cancer  Practice  breast self-awareness. This means understanding how your breasts normally appear and feel.  It also means doing regular breast self-exams. Let your health care provider know about any changes, no matter how small.  If you are in your 20s or 30s, you should have a clinical breast exam (CBE) by a health care provider every 1-3 years as part of a regular health exam.  If you are 91 or older, have a CBE every year. Also consider having a breast X-ray (mammogram) every year.  If you have a family  history of breast cancer, talk to your health care provider about genetic screening.  If you are at high risk for breast cancer, talk to your health care provider about having an MRI and a mammogram every year.  Breast cancer gene (BRCA) assessment is recommended for women who have family members with BRCA-related cancers. BRCA-related cancers include:  Breast.  Ovarian.  Tubal.  Peritoneal cancers.  Results of the assessment will determine the need for genetic counseling and BRCA1 and BRCA2 testing. Cervical Cancer Routine pelvic examinations to screen for cervical cancer are no longer recommended for nonpregnant women who are considered low risk for cancer of the pelvic organs (ovaries, uterus, and vagina) and who do not have symptoms. A pelvic examination may be necessary if you have symptoms including those associated with pelvic infections. Ask your health care provider if a screening pelvic exam is right for you.   The Pap test is the screening test for cervical cancer for women who are considered at risk.  If you had a hysterectomy for a problem that was not cancer or a condition that could lead to cancer, then you no longer need Pap tests.  If you are older than 65 years, and you have had normal Pap tests for the past 10 years, you no longer need to have Pap tests.  If you have had past treatment for cervical cancer or a condition that could lead to cancer, you need Pap tests and screening for cancer for at least 20 years after your treatment.  If you no longer get a Pap test, assess your risk factors if they change (such as having a new sexual partner). This can affect whether you should start being screened again.  Some women have medical problems that increase their chance of getting cervical cancer. If this is the case for you, your health care provider may recommend more frequent screening and Pap tests.  The human papillomavirus (HPV) test is another test that may be used  for cervical cancer screening. The HPV test looks for the virus that can cause cell changes in the cervix. The cells collected during the Pap test can be tested for HPV.  The HPV test can be used to screen women 71 years of age and older. Getting tested for HPV can extend the interval between normal Pap tests from three to five years.  An HPV test also should be used to screen women of any age who have unclear Pap test results.  After 60 years of age, women should have HPV testing as often as Pap tests.  Colorectal Cancer  This type of cancer can be detected and often prevented.  Routine colorectal cancer screening usually begins at 61 years of age and continues through 60 years of age.  Your health care provider may recommend screening at an earlier age if you have risk factors for colon cancer.  Your health care provider may also recommend using home test kits to  check for hidden blood in the stool.  A small camera at the end of a tube can be used to examine your colon directly (sigmoidoscopy or colonoscopy). This is done to check for the earliest forms of colorectal cancer.  Routine screening usually begins at age 72.  Direct examination of the colon should be repeated every 5-10 years through 60 years of age. However, you may need to be screened more often if early forms of precancerous polyps or small growths are found. Skin Cancer  Check your skin from head to toe regularly.  Tell your health care provider about any new moles or changes in moles, especially if there is a change in a mole's shape or color.  Also tell your health care provider if you have a mole that is larger than the size of a pencil eraser.  Always use sunscreen. Apply sunscreen liberally and repeatedly throughout the day.  Protect yourself by wearing long sleeves, pants, a wide-brimmed hat, and sunglasses whenever you are outside. HEART DISEASE, DIABETES, AND HIGH BLOOD PRESSURE   Have your blood pressure  checked at least every 1-2 years. High blood pressure causes heart disease and increases the risk of stroke.  If you are between 33 years and 31 years old, ask your health care provider if you should take aspirin to prevent strokes.  Have regular diabetes screenings. This involves taking a blood sample to check your fasting blood sugar level.  If you are at a normal weight and have a low risk for diabetes, have this test once every three years after 60 years of age.  If you are overweight and have a high risk for diabetes, consider being tested at a younger age or more often. PREVENTING INFECTION  Hepatitis B  If you have a higher risk for hepatitis B, you should be screened for this virus. You are considered at high risk for hepatitis B if:  You were born in a country where hepatitis B is common. Ask your health care provider which countries are considered high risk.  Your parents were born in a high-risk country, and you have not been immunized against hepatitis B (hepatitis B vaccine).  You have HIV or AIDS.  You use needles to inject street drugs.  You live with someone who has hepatitis B.  You have had sex with someone who has hepatitis B.  You get hemodialysis treatment.  You take certain medicines for conditions, including cancer, organ transplantation, and autoimmune conditions. Hepatitis C  Blood testing is recommended for:  Everyone born from [birth year] through 1967.  Anyone with known risk factors for hepatitis C. Sexually transmitted infections (STIs)  You should be screened for sexually transmitted infections (STIs) including gonorrhea and chlamydia if:  You are sexually active and are younger than 60 years of age.  You are older than 60 years of age and your health care provider tells you that you are at risk for this type of infection.  Your sexual activity has changed since you were last screened and you are at an increased risk for chlamydia or gonorrhea. Ask  your health care provider if you are at risk.  If you do not have HIV, but are at risk, it may be recommended that you take a prescription medicine daily to prevent HIV infection. This is called pre-exposure prophylaxis (PrEP). You are considered at risk if:  You are sexually active and do not regularly use condoms or know the HIV status of your partner(s).  You take  drugs by injection.  You are sexually active with a partner who has HIV. Talk with your health care provider about whether you are at high risk of being infected with HIV. If you choose to begin PrEP, you should first be tested for HIV. You should then be tested every 3 months for as long as you are taking PrEP.  PREGNANCY   If you are premenopausal and you may become pregnant, ask your health care provider about preconception counseling.  If you may become pregnant, take 400 to 800 micrograms (mcg) of folic acid every day.  If you want to prevent pregnancy, talk to your health care provider about birth control (contraception). OSTEOPOROSIS AND MENOPAUSE   Osteoporosis is a disease in which the bones lose minerals and strength with aging. This can result in serious bone fractures. Your risk for osteoporosis can be identified using a bone density scan.  If you are 56 years of age or older, or if you are at risk for osteoporosis and fractures, ask your health care provider if you should be screened.  Ask your health care provider whether you should take a calcium or vitamin D supplement to lower your risk for osteoporosis.  Menopause may have certain physical symptoms and risks.  Hormone replacement therapy may reduce some of these symptoms and risks. Talk to your health care provider about whether hormone replacement therapy is right for you.  HOME CARE INSTRUCTIONS   Schedule regular health, dental, and eye exams.  Stay current with your immunizations.   Do not use any tobacco products including cigarettes, chewing  tobacco, or electronic cigarettes.  If you are pregnant, do not drink alcohol.  If you are breastfeeding, limit how much and how often you drink alcohol.  Limit alcohol intake to no more than 1 drink per day for nonpregnant women. One drink equals 12 ounces of beer, 5 ounces of wine, or 1 ounces of hard liquor.  Do not use street drugs.  Do not share needles.  Ask your health care provider for help if you need support or information about quitting drugs.  Tell your health care provider if you often feel depressed.  Tell your health care provider if you have ever been abused or do not feel safe at home. Document Released: 04/03/2011 Document Revised: 02/02/2014 Document Reviewed: 08/20/2013 [facility] Patient Information 2015 [place], [place]. This information is not intended to replace advice given to you by your health care provider. Make sure you discuss any questions you have with your health care provider.

## 2014-05-13 NOTE — Telephone Encounter (Signed)
Pt stated that she did not want the TSH done because she is seeing her specialist tomorrow and did not want to be test twice for it.

## 2014-08-11 ENCOUNTER — Other Ambulatory Visit: Payer: Self-pay | Admitting: Internal Medicine

## 2015-01-22 ENCOUNTER — Telehealth: Payer: Self-pay | Admitting: Internal Medicine

## 2015-01-22 DIAGNOSIS — Z111 Encounter for screening for respiratory tuberculosis: Secondary | ICD-10-CM

## 2015-01-22 NOTE — Telephone Encounter (Signed)
Patient calling to advise that she needs a TB test, but is allergic to the PPD. She states that Dr. Asa Lente told her to call in when she needed one and ask her. Please advise patient. Thank you.

## 2015-01-23 NOTE — Telephone Encounter (Signed)
Would you like a chest xray ordered to rule out TB expose/infection.

## 2015-01-26 NOTE — Telephone Encounter (Signed)
Patient called back and will need to know by the 1st week in May.

## 2015-01-28 NOTE — Telephone Encounter (Signed)
Results are n/a Thx

## 2015-01-28 NOTE — Telephone Encounter (Signed)
Chest xray ordered and scheduled.  Waiting on call back from xray to make sure that all was entered correctly.   Informed that pt can just go to xray department when she is ready.   Pt needs results to give to employer as soon as feasible. Any help is appreciated.

## 2015-01-29 ENCOUNTER — Other Ambulatory Visit: Payer: BC Managed Care – PPO

## 2015-02-01 ENCOUNTER — Ambulatory Visit (INDEPENDENT_AMBULATORY_CARE_PROVIDER_SITE_OTHER)
Admission: RE | Admit: 2015-02-01 | Discharge: 2015-02-01 | Disposition: A | Discharge status: Home / Self Care | Payer: BLUE CROSS/BLUE SHIELD | Source: Ambulatory Visit | Attending: Internal Medicine | Admitting: Internal Medicine

## 2015-02-01 ENCOUNTER — Encounter: Payer: Self-pay | Admitting: Family

## 2015-02-01 ENCOUNTER — Ambulatory Visit (INDEPENDENT_AMBULATORY_CARE_PROVIDER_SITE_OTHER): Payer: BLUE CROSS/BLUE SHIELD | Admitting: Family

## 2015-02-01 VITALS — BP 102/74 | HR 95 | Temp 98.4°F | Resp 18 | Ht 66.0 in | Wt 222.4 lb

## 2015-02-01 DIAGNOSIS — M8580 Other specified disorders of bone density and structure, unspecified site: Secondary | ICD-10-CM | POA: Insufficient documentation

## 2015-02-01 DIAGNOSIS — J069 Acute upper respiratory infection, unspecified: Secondary | ICD-10-CM

## 2015-02-01 DIAGNOSIS — Z111 Encounter for screening for respiratory tuberculosis: Secondary | ICD-10-CM

## 2015-02-01 MED ORDER — HYDROCODONE-HOMATROPINE 5-1.5 MG/5ML PO SYRP: 5.0000 mL | ORAL_SOLUTION | Freq: Three times a day (TID) | ORAL | Status: DC | PRN

## 2015-02-01 MED ORDER — CEFUROXIME AXETIL 250 MG PO TABS: 250.0000 mg | ORAL_TABLET | Freq: Two times a day (BID) | ORAL | Status: DC

## 2015-02-01 NOTE — Assessment & Plan Note (Signed)
Previously diagnosed osteopenia. Obtain DEXA to determine current status.

## 2015-02-01 NOTE — Progress Notes (Signed)
Pre visit review using our clinic review tool, if applicable. No additional management support is needed unless otherwise documented below in the visit note. 

## 2015-02-01 NOTE — Patient Instructions (Signed)
Thank you for choosing [facility].  Summary/Instructions:  Your prescription(s) have been submitted to your pharmacy or been printed and provided for you. Please take as directed and contact our office if you believe you are having problem(s) with the medication(s) or have any questions.  If your symptoms worsen or fail to improve, please contact our office for further instruction, or in case of emergency go directly to the emergency room at the closest medical facility.   General Recommendations:    Please drink plenty of fluids.  Get plenty of rest   Sleep in humidified air  Use saline nasal sprays  Netti pot   OTC Medications:  Decongestants - helps relieve congestion   Flonase (generic fluticasone) or Nasacort (generic triamcinolone) - please make sure to use the "cross-over" technique at a 45 degree angle towards the opposite eye as opposed to straight up the nasal passageway.   Sudafed (generic pseudoephedrine - Note this is the one that is available behind the pharmacy counter); Products with phenylephrine (-PE) may also be used but is often not as effective as pseudoephedrine.   If you have HIGH BLOOD PRESSURE - Coricidin HBP; AVOID any product that is -D as this contains pseudoephedrine which may increase your blood pressure.  Afrin (oxymetazoline) every 6-8 hours for up to 3 days.   Allergies - helps relieve runny nose, itchy eyes and sneezing   Claritin (generic loratidine), Allegra (fexofenidine), or Zyrtec (generic cyrterizine) for runny nose. These medications should not cause drowsiness.  Note - Benadryl (generic diphenhydramine) may be used however may cause drowsiness  Cough -   Delsym or Robitussin (generic dextromethorphan)  Expectorants - helps loosen mucus to ease removal   Mucinex (generic guaifenesin) as directed on the package.  Headaches / General Aches   Tylenol (generic acetaminophen) - DO NOT EXCEED 3 grams (3,000 mg) in a 24  hour time period  Advil/Motrin (generic ibuprofen)   Sore Throat -   Salt water gargle   Chloraseptic (generic benzocaine) spray or lozenges / Sucrets (generic dyclonine)      

## 2015-02-01 NOTE — Assessment & Plan Note (Signed)
Symptoms and exam consistent with upper respiratory infection possibly viral given symptoms of improvement. Patient provided a prescription for antibiotic if symptoms worsen in the next 2 days. Start Hycodan as needed for cough and sleep. Continue over-the-counter medications as needed for symptom relief and supportive care. Follow-up if symptoms worsen or fail to improve.

## 2015-02-01 NOTE — Progress Notes (Signed)
   Subjective:    Patient ID: Patricia Greene, female    DOB: 06-May-1956, 59 y.o.   MRN: [id number]  Chief Complaint  Patient presents with  . Cough    chest feels full like she has a bunch of mucous that needs to be coughed up but it will not come out, drainage, cough x1 week    HPI:  Patricia Greene is a 59 y.o. female with a PMH of migraines, hypothyroidism, fibromyalgia, osteopenia, anxiety, and irritable bowel syndrome who presents today for an acute office visit.  1) Cough - This is a new problem. Associated symptoms of cough, drainage, congestion, and headache have been going on for about a week. Modifying factors include Robutussin, benedryl, nasacort which seemed to help some. Notes that the cough in non-productive.  Notes that she does feel a little better today compared to the last couple of days.   2) Osteopenia - patient previously noted to have osteopenia on previous bone mineral density scan. She is requesting a referral for another bone mineral density test.  No Known Allergies   Current Outpatient Prescriptions on File Prior to Visit  Medication Sig Dispense Refill  . CALCIUM PO Take by mouth. 3 tabs in the morning 2 tabs at lunch     . Cholecalciferol (VITAMIN D PO) Take by mouth. 2000 units daily     . ibuprofen (ADVIL) 200 MG tablet Take 2 tablets (400 mg total) by mouth every 6 (six) hours as needed. 30 tablet 0  . levothyroxine (SYNTHROID, LEVOTHROID) 75 MCG tablet Take 75 mcg by mouth daily.      Marland Kitchen liothyronine (CYTOMEL) 25 MCG tablet 1/2 daily     . LORazepam (ATIVAN) 1 MG tablet TABLET BY MOUTH TWICE A DAY AS NEEDED FOR ANXIETY 60 tablet 5  . Multiple Vitamins-Minerals (CENTRUM SILVER) tablet Take 1 tablet by mouth daily.       No current facility-administered medications on file prior to visit.    Review of Systems  Constitutional: Negative for fever and chills.  HENT: Positive for congestion, facial swelling, sinus pressure, sneezing and sore throat.     Respiratory: Positive for cough and chest tightness. Negative for shortness of breath and wheezing.   Neurological: Positive for headaches.      Objective:    BP 102/74 mmHg  Pulse 95  Temp(Src) 98.4 F (36.9 C) (Oral)  Resp 18  Ht 5\' 6"  (1.676 m)  Wt 222 lb 6.4 oz (100.88 kg)  BMI 35.91 kg/m2  SpO2 96% Nursing note and vital signs reviewed.  Physical Exam  Constitutional: She is oriented to person, place, and time. She appears well-developed and well-nourished. No distress.  HENT:  Right Ear: Hearing, tympanic membrane, external ear and ear canal normal.  Left Ear: Hearing, tympanic membrane, external ear and ear canal normal.  Nose: Right sinus exhibits frontal sinus tenderness. Right sinus exhibits no maxillary sinus tenderness. Left sinus exhibits frontal sinus tenderness. Left sinus exhibits no maxillary sinus tenderness.  Cardiovascular: Normal rate, regular rhythm, normal heart sounds and intact distal pulses.   Pulmonary/Chest: Effort normal and breath sounds normal.  Neurological: She is alert and oriented to person, place, and time.  Skin: Skin is warm and dry.  Psychiatric: She has a normal mood and affect. Her behavior is normal. Judgment and thought content normal.       Assessment & Plan:

## 2015-02-14 ENCOUNTER — Other Ambulatory Visit: Payer: Self-pay | Admitting: Internal Medicine

## 2015-05-17 ENCOUNTER — Encounter: Payer: BC Managed Care – PPO | Admitting: Internal Medicine

## 2015-05-27 LAB — TSH: TSH: 7.01 u[IU]/mL — AB (ref 0.41–5.90)

## 2015-06-21 ENCOUNTER — Encounter: Payer: Self-pay | Admitting: Internal Medicine

## 2015-06-30 ENCOUNTER — Encounter: Payer: Self-pay | Admitting: Internal Medicine

## 2015-06-30 ENCOUNTER — Other Ambulatory Visit (INDEPENDENT_AMBULATORY_CARE_PROVIDER_SITE_OTHER): Payer: BLUE CROSS/BLUE SHIELD

## 2015-06-30 ENCOUNTER — Ambulatory Visit (INDEPENDENT_AMBULATORY_CARE_PROVIDER_SITE_OTHER): Payer: BLUE CROSS/BLUE SHIELD | Admitting: Internal Medicine

## 2015-06-30 VITALS — BP 120/80 | HR 72 | Temp 98.0°F | Ht 66.0 in | Wt 225.0 lb

## 2015-06-30 DIAGNOSIS — Z23 Encounter for immunization: Secondary | ICD-10-CM | POA: Diagnosis not present

## 2015-06-30 DIAGNOSIS — Z0000 Encounter for general adult medical examination without abnormal findings: Secondary | ICD-10-CM | POA: Diagnosis not present

## 2015-06-30 DIAGNOSIS — E890 Postprocedural hypothyroidism: Secondary | ICD-10-CM

## 2015-06-30 DIAGNOSIS — S72112P Displaced fracture of greater trochanter of left femur, subsequent encounter for closed fracture with malunion: Secondary | ICD-10-CM

## 2015-06-30 LAB — CBC WITH DIFFERENTIAL/PLATELET
BASOS PCT: 0.5 % (ref 0.0–3.0)
Basophils Absolute: 0 10*3/uL (ref 0.0–0.1)
EOS ABS: 0.1 10*3/uL (ref 0.0–0.7)
Eosinophils Relative: 1.2 % (ref 0.0–5.0)
HCT: 38.7 % (ref 36.0–46.0)
HEMOGLOBIN: 13 g/dL (ref 12.0–15.0)
Lymphocytes Relative: 40 % (ref 12.0–46.0)
Lymphs Abs: 2.2 10*3/uL (ref 0.7–4.0)
MCHC: 33.6 g/dL (ref 30.0–36.0)
MCV: 91.9 fl (ref 78.0–100.0)
MONO ABS: 0.4 10*3/uL (ref 0.1–1.0)
Monocytes Relative: 6.8 % (ref 3.0–12.0)
Neutro Abs: 2.8 10*3/uL (ref 1.4–7.7)
Neutrophils Relative %: 51.5 % (ref 43.0–77.0)
Platelets: 228 10*3/uL (ref 150.0–400.0)
RBC: 4.22 Mil/uL (ref 3.87–5.11)
RDW: 14.3 % (ref 11.5–15.5)
WBC: 5.4 10*3/uL (ref 4.0–10.5)

## 2015-06-30 LAB — BASIC METABOLIC PANEL
BUN: 13 mg/dL (ref 6–23)
CO2: 29 mEq/L (ref 19–32)
CREATININE: 0.76 mg/dL (ref 0.40–1.20)
Calcium: 9.3 mg/dL (ref 8.4–10.5)
Chloride: 106 mEq/L (ref 96–112)
GFR: 82.14 mL/min (ref >60.00)
Glucose, Bld: 95 mg/dL (ref 70–99)
Potassium: 4 mEq/L (ref 3.5–5.1)
Sodium: 140 mEq/L (ref 135–145)

## 2015-06-30 LAB — LIPID PANEL
CHOL/HDL RATIO: 3
Cholesterol: 157 mg/dL (ref 0–200)
HDL: 54.5 mg/dL (ref >39.00)
LDL Cholesterol: 87 mg/dL (ref 0–99)
NONHDL: 102.86
Triglycerides: 81 mg/dL (ref 0.0–149.0)
VLDL: 16.2 mg/dL (ref 0.0–40.0)

## 2015-06-30 LAB — HEPATIC FUNCTION PANEL
ALT: 13 U/L (ref 0–35)
AST: 15 U/L (ref 0–37)
Albumin: 4.1 g/dL (ref 3.5–5.2)
Alkaline Phosphatase: 61 U/L (ref 39–117)
BILIRUBIN DIRECT: 0.1 mg/dL (ref 0.0–0.3)
BILIRUBIN TOTAL: 0.6 mg/dL (ref 0.2–1.2)
Total Protein: 6.4 g/dL (ref 6.0–8.3)

## 2015-06-30 LAB — URINALYSIS, ROUTINE W REFLEX MICROSCOPIC
BILIRUBIN URINE: NEGATIVE
Hgb urine dipstick: NEGATIVE
KETONES UR: NEGATIVE
Leukocytes, UA: NEGATIVE
Nitrite: NEGATIVE
PH: 6.5 (ref 5.0–8.0)
RBC / HPF: NONE SEEN (ref 0–?)
Specific Gravity, Urine: 1.01 (ref 1.000–1.030)
TOTAL PROTEIN, URINE-UPE24: NEGATIVE
URINE GLUCOSE: NEGATIVE
UROBILINOGEN UA: 0.2 (ref 0.0–1.0)
WBC, UA: NONE SEEN (ref 0–?)

## 2015-06-30 LAB — TSH: TSH: 1.05 u[IU]/mL (ref 0.35–4.50)

## 2015-06-30 MED ORDER — MELOXICAM 15 MG PO TABS: 15.0000 mg | ORAL_TABLET | Freq: Every day | ORAL | Status: DC

## 2015-06-30 NOTE — Assessment & Plan Note (Signed)
S/p resection - total thyroidectomy in 2003 Ongoing management by endocrinology, recent changes reviewed The current medical regimen is effective;  continue present plan and medications.

## 2015-06-30 NOTE — Progress Notes (Signed)
Pre visit review using our clinic review tool, if applicable. No additional management support is needed unless otherwise documented below in the visit note. 

## 2015-06-30 NOTE — Progress Notes (Signed)
Subjective:    Patient ID: Patricia Greene, female    DOB: 1956/07/29, 59 y.o.   MRN: [id number]  HPI  patient is here today for annual physical. Reviewed chronic medical conditions, interval events and current concerns  Past Medical History  Diagnosis Date  . Hypothyroidism     nodular goiter  . Cervicalgia   . Fibromyalgia   . Unspecified vitamin D deficiency   . History of migraine headaches   . Anxiety state, unspecified   . Insomnia   . Allergic rhinitis   . Carotid bruit   . IBS (irritable bowel syndrome)   . Diverticulosis   . Obesity    Family History  Problem Relation Age of Onset  . Asthma Mother   . Breast cancer Mother   . Allergic rhinitis Mother   . Heart disease Mother   . Colon cancer Father 52    died at 46  . Colon polyps Sister   . Esophageal cancer Neg Hx   . Stomach cancer Neg Hx    Social History  Substance Use Topics  . Smoking status: Never Smoker   . Smokeless tobacco: Never Used  . Alcohol Use: No     Comment: rare     Review of Systems  Constitutional: Positive for fatigue and unexpected weight change (increased).  Respiratory: Negative for cough, shortness of breath and wheezing.   Cardiovascular: Negative for chest pain, palpitations and leg swelling.  Gastrointestinal: Negative for nausea, abdominal pain and diarrhea.  Musculoskeletal: Positive for gait problem (L lateral hip pain, esp with direct pressure, also radiation to IT band and buttock).  Neurological: Negative for dizziness, weakness, light-headedness and headaches.  Psychiatric/Behavioral: Negative for dysphoric mood. The patient is not nervous/anxious.   All other systems reviewed and are negative.      Objective:    Physical Exam  Constitutional: She is oriented to person, place, and time. She appears well-developed and well-nourished. No distress.  obese  HENT:  Head: Normocephalic and atraumatic.  Right Ear: External ear normal.  Left Ear: External ear  normal.  Nose: Nose normal.  Mouth/Throat: Oropharynx is clear and moist. No oropharyngeal exudate.  Eyes: EOM are normal. Pupils are equal, round, and reactive to light. Right eye exhibits no discharge. Left eye exhibits no discharge. No scleral icterus.  Neck: Normal range of motion. Neck supple. No JVD present. No tracheal deviation present. No thyromegaly present.  Cardiovascular: Normal rate, regular rhythm, normal heart sounds and intact distal pulses.  Exam reveals no friction rub.   No murmur heard. Pulmonary/Chest: Effort normal and breath sounds normal. No respiratory distress. She has no wheezes. She has no rales. She exhibits no tenderness.  Abdominal: Soft. Bowel sounds are normal. She exhibits no distension and no mass. There is no tenderness. There is no rebound and no guarding.  Musculoskeletal: Normal range of motion.  Lymphadenopathy:    She has no cervical adenopathy.  Neurological: She is alert and oriented to person, place, and time. She has normal reflexes. No cranial nerve deficit.  Skin: Skin is warm and dry. No rash noted. She is not diaphoretic. No erythema.  Psychiatric: She has a normal mood and affect. Her behavior is normal. Judgment and thought content normal.  Nursing note and vitals reviewed.   BP 120/80 mmHg  Pulse 72  Temp(Src) 98 F (36.7 C) (Oral)  Ht 5\' 6"  (1.676 m)  Wt 225 lb (102.059 kg)  BMI 36.33 kg/m2  SpO2 97% Wt Readings from  Last 3 Encounters:  06/30/15 225 lb (102.059 kg)  02/01/15 222 lb 6.4 oz (100.88 kg)  05/13/14 214 lb (97.07 kg)    Lab Results  Component Value Date   WBC 5.2 05/13/2014   HGB 13.4 05/13/2014   HCT 39.6 05/13/2014   PLT 210.0 05/13/2014   GLUCOSE 90 05/13/2014   CHOL 167 05/13/2014   TRIG 53.0 05/13/2014   HDL 56.20 05/13/2014   LDLCALC 100* 05/13/2014   ALT 16 05/13/2014   AST 17 05/13/2014   NA 139 05/13/2014   K 3.9 05/13/2014   CL 105 05/13/2014   CREATININE 0.8 05/13/2014   BUN 16 05/13/2014    CO2 28 05/13/2014   TSH 7.01* 05/27/2015    Dg Chest 2 View  02/01/2015   CLINICAL DATA:  History of a allergy to PPD skin test, screening for TB  EXAM: CHEST  2 VIEW  COMPARISON:  Chest x-ray of 07/09/2007  FINDINGS: No active infiltrate or effusion is seen. No sequela of prior tuberculous infection is seen. Mediastinal and hilar contours are unremarkable. The heart is within normal limits in size. Only mild degenerative change is noted in the lower thoracic spine. A single metal clip overlies the right breast.  IMPRESSION: No active cardiopulmonary disease.   Electronically Signed   By: Ivar Drape M.D.   On: 02/01/2015 16:20       Assessment & Plan:   CPX/z00.00 - Patient has been counseled on age-appropriate routine health concerns for screening and prevention. These are reviewed and up-to-date. Immunizations are up-to-date or declined. Labs ordered and reviewed.  Problem List Items Addressed This Visit    Hypothyroidism    S/p resection - total thyroidectomy in 2003 Ongoing management by endocrinology, recent changes reviewed The current medical regimen is effective;  continue present plan and medications.      Relevant Medications   SYNTHROID 100 MCG tablet    Other Visit Diagnoses    Routine general medical examination at a health care facility    -  Primary        Gwendolyn Grant, MD

## 2015-06-30 NOTE — Patient Instructions (Addendum)
It was good to see you today.  We have reviewed your prior records including labs and tests today  Health Maintenance reviewed - all recommended immunizations and age-appropriate screenings are up-to-date.  Test(s) ordered today. Your results will be released to [place] (or called to you) after review, usually within 72hours after test completion. If any changes need to be made, you will be notified at that same time.  Medications reviewed and updated Use meloxicam once daily for inflammation related to pain as discussed. No other changes recommended at this time. Your prescription(s) have been submitted to your pharmacy. Please take as directed and contact our office if you believe you are having problem(s) with the medication(s).  Please schedule followup in 12 months for annual exam and labs, call sooner if problems.  Health Maintenance Adopting a healthy lifestyle and getting preventive care can go a long way to promote health and wellness. Talk with your health care [employer] [employer] about what schedule of regular examinations is right for you. This is a good chance for you to check in with your Shamyia Grandpre about disease prevention and staying healthy. In between checkups, there are plenty of things you can do on your own. Experts have done a lot of research about which lifestyle changes and preventive measures are most likely to keep you healthy. Ask your health care Hannelore Bova for more information. WEIGHT AND DIET  Eat a healthy diet  Be sure to include plenty of vegetables, fruits, low-fat dairy products, and lean protein.  Do not eat a lot of foods high in solid fats, added sugars, or salt.  Get regular exercise. This is one of the most important things you can do for your health.  Most adults should exercise for at least 150 minutes each week. The exercise should increase your heart rate and make you sweat (moderate-intensity exercise).  Most adults should also do strengthening exercises at  least twice a week. This is in addition to the moderate-intensity exercise.  Maintain a healthy weight  Body mass index (BMI) is a measurement that can be used to identify possible weight problems. It estimates body fat based on height and weight. Your health care Jilliam Bellmore can help determine your BMI and help you achieve or maintain a healthy weight.  For females 31 years of age and older:   A BMI below 18.5 is considered underweight.  A BMI of 18.5 to 24.9 is normal.  A BMI of 25 to 29.9 is considered overweight.  A BMI of 30 and above is considered obese.  Watch levels of cholesterol and blood lipids  You should start having your blood tested for lipids and cholesterol at 61 years of age, then have this test every 5 years.  You may need to have your cholesterol levels checked more often if:  Your lipid or cholesterol levels are high.  You are older than 61 years of age.  You are at high risk for heart disease.  CANCER SCREENING   Lung Cancer  Lung cancer screening is recommended for adults 35-59 years old who are at high risk for lung cancer because of a history of smoking.  A yearly low-dose CT scan of the lungs is recommended for people who:  Currently smoke.  Have quit within the past 15 years.  Have at least a 30-pack-year history of smoking. A pack year is smoking an average of one pack of cigarettes a day for 1 year.  Yearly screening should continue until it has been 15 years since  you quit.  Yearly screening should stop if you develop a health problem that would prevent you from having lung cancer treatment.  Breast Cancer  Practice breast self-awareness. This means understanding how your breasts normally appear and feel.  It also means doing regular breast self-exams. Let your health care provider know about any changes, no matter how small.  If you are in your 20s or 30s, you should have a clinical breast exam (CBE) by a health care provider every 1-3  years as part of a regular health exam.  If you are 6 or older, have a CBE every year. Also consider having a breast X-ray (mammogram) every year.  If you have a family history of breast cancer, talk to your health care provider about genetic screening.  If you are at high risk for breast cancer, talk to your health care provider about having an MRI and a mammogram every year.  Breast cancer gene (BRCA) assessment is recommended for women who have family members with BRCA-related cancers. BRCA-related cancers include:  Breast.  Ovarian.  Tubal.  Peritoneal cancers.  Results of the assessment will determine the need for genetic counseling and BRCA1 and BRCA2 testing. Cervical Cancer Routine pelvic examinations to screen for cervical cancer are no longer recommended for nonpregnant women who are considered low risk for cancer of the pelvic organs (ovaries, uterus, and vagina) and who do not have symptoms. A pelvic examination may be necessary if you have symptoms including those associated with pelvic infections. Ask your health care provider if a screening pelvic exam is right for you.   The Pap test is the screening test for cervical cancer for women who are considered at risk.  If you had a hysterectomy for a problem that was not cancer or a condition that could lead to cancer, then you no longer need Pap tests.  If you are older than 65 years, and you have had normal Pap tests for the past 10 years, you no longer need to have Pap tests.  If you have had past treatment for cervical cancer or a condition that could lead to cancer, you need Pap tests and screening for cancer for at least 20 years after your treatment.  If you no longer get a Pap test, assess your risk factors if they change (such as having a new sexual partner). This can affect whether you should start being screened again.  Some women have medical problems that increase their chance of getting cervical cancer. If  this is the case for you, your health care provider may recommend more frequent screening and Pap tests.  The human papillomavirus (HPV) test is another test that may be used for cervical cancer screening. The HPV test looks for the virus that can cause cell changes in the cervix. The cells collected during the Pap test can be tested for HPV.  The HPV test can be used to screen women 55 years of age and older. Getting tested for HPV can extend the interval between normal Pap tests from three to five years.  An HPV test also should be used to screen women of any age who have unclear Pap test results.  After 61 years of age, women should have HPV testing as often as Pap tests.  Colorectal Cancer  This type of cancer can be detected and often prevented.  Routine colorectal cancer screening usually begins at 61 years of age and continues through 61 years of age.  Your health care provider may  recommend screening at an earlier age if you have risk factors for colon cancer.  Your health care Aerianna Losey may also recommend using home test kits to check for hidden blood in the stool.  A small camera at the end of a tube can be used to examine your colon directly (sigmoidoscopy or colonoscopy). This is done to check for the earliest forms of colorectal cancer.  Routine screening usually begins at age 46.  Direct examination of the colon should be repeated every 5-10 years through 61 years of age. However, you may need to be screened more often if early forms of precancerous polyps or small growths are found. Skin Cancer  Check your skin from head to toe regularly.  Tell your health care Rehaan Viloria about any new moles or changes in moles, especially if there is a change in a mole's shape or color.  Also tell your health care Lelan Cush if you have a mole that is larger than the size of a pencil eraser.  Always use sunscreen. Apply sunscreen liberally and repeatedly throughout the day.  Protect  yourself by wearing long sleeves, pants, a wide-brimmed hat, and sunglasses whenever you are outside. HEART DISEASE, DIABETES, AND HIGH BLOOD PRESSURE   Have your blood pressure checked at least every 1-2 years. High blood pressure causes heart disease and increases the risk of stroke.  If you are between 51 years and 63 years old, ask your health care Flavius Repsher if you should take aspirin to prevent strokes.  Have regular diabetes screenings. This involves taking a blood sample to check your fasting blood sugar level.  If you are at a normal weight and have a low risk for diabetes, have this test once every three years after 61 years of age.  If you are overweight and have a high risk for diabetes, consider being tested at a younger age or more often. PREVENTING INFECTION  Hepatitis B  If you have a higher risk for hepatitis B, you should be screened for this virus. You are considered at high risk for hepatitis B if:  You were born in a country where hepatitis B is common. Ask your health care Nadelyn Enriques which countries are considered high risk.  Your parents were born in a high-risk country, and you have not been immunized against hepatitis B (hepatitis B vaccine).  You have HIV or AIDS.  You use needles to inject street drugs.  You live with someone who has hepatitis B.  You have had sex with someone who has hepatitis B.  You get hemodialysis treatment.  You take certain medicines for conditions, including cancer, organ transplantation, and autoimmune conditions. Hepatitis C  Blood testing is recommended for:  Everyone born from [birth year] through 1967.  Anyone with known risk factors for hepatitis C. Sexually transmitted infections (STIs)  You should be screened for sexually transmitted infections (STIs) including gonorrhea and chlamydia if:  You are sexually active and are younger than 61 years of age.  You are older than 62 years of age and your health care Blanca Carreon tells you  that you are at risk for this type of infection.  Your sexual activity has changed since you were last screened and you are at an increased risk for chlamydia or gonorrhea. Ask your health care Josimar Corning if you are at risk.  If you do not have HIV, but are at risk, it may be recommended that you take a prescription medicine daily to prevent HIV infection. This is called pre-exposure prophylaxis (PrEP). You  are considered at risk if:  You are sexually active and do not regularly use condoms or know the HIV status of your partner(s).  You take drugs by injection.  You are sexually active with a partner who has HIV. Talk with your health care provider about whether you are at high risk of being infected with HIV. If you choose to begin PrEP, you should first be tested for HIV. You should then be tested every 3 months for as long as you are taking PrEP.  PREGNANCY   If you are premenopausal and you may become pregnant, ask your health care provider about preconception counseling.  If you may become pregnant, take 400 to 800 micrograms (mcg) of folic acid every day.  If you want to prevent pregnancy, talk to your health care provider about birth control (contraception). OSTEOPOROSIS AND MENOPAUSE   Osteoporosis is a disease in which the bones lose minerals and strength with aging. This can result in serious bone fractures. Your risk for osteoporosis can be identified using a bone density scan.  If you are 24 years of age or older, or if you are at risk for osteoporosis and fractures, ask your health care provider if you should be screened.  Ask your health care provider whether you should take a calcium or vitamin D supplement to lower your risk for osteoporosis.  Menopause may have certain physical symptoms and risks.  Hormone replacement therapy may reduce some of these symptoms and risks. Talk to your health care provider about whether hormone replacement therapy is right for you.  HOME  CARE INSTRUCTIONS   Schedule regular health, dental, and eye exams.  Stay current with your immunizations.   Do not use any tobacco products including cigarettes, chewing tobacco, or electronic cigarettes.  If you are pregnant, do not drink alcohol.  If you are breastfeeding, limit how much and how often you drink alcohol.  Limit alcohol intake to no more than 1 drink per day for nonpregnant women. One drink equals 12 ounces of beer, 5 ounces of wine, or 1 ounces of hard liquor.  Do not use street drugs.  Do not share needles.  Ask your health care provider for help if you need support or information about quitting drugs.  Tell your health care provider if you often feel depressed.  Tell your health care provider if you have ever been abused or do not feel safe at home. Document Released: 04/03/2011 Document Revised: 02/02/2014 Document Reviewed: 08/20/2013 [facility] Patient Information 2015 [place], [place]. This information is not intended to replace advice given to you by your health care provider. Make sure you discuss any questions you have with your health care provider. Trochanteric Bursitis You have hip pain due to trochanteric bursitis. Bursitis means that the sack near the outside of the hip is filled with fluid and inflamed. This sack is made up of protective soft tissue. The pain from trochanteric bursitis can be severe and keep you from sleep. It can radiate to the buttocks or down the outside of the thigh to the knee. The pain is almost always worse when rising from the seated or lying position and with walking. Pain can improve after you take a few steps. It happens more often in people with hip joint and lumbar spine problems, such as arthritis or previous surgery. Very rarely the trochanteric bursa can become infected, and antibiotics and/or surgery may be needed. Treatment often includes an injection of local anesthetic mixed with cortisone medicine. This medicine  is  injected into the area where it is most tender over the hip. Repeat injections may be necessary if the response to treatment is slow. You can apply ice packs over the tender area for 30 minutes every 2 hours for the next few days. Anti-inflammatory and/or narcotic pain medicine may also be helpful. Limit your activity for the next few days if the pain continues. See your caregiver in 5-10 days if you are not greatly improved.  SEEK IMMEDIATE MEDICAL CARE IF:  You develop severe pain, fever, or increased redness.  You have pain that radiates below the knee. EXERCISES STRETCHING EXERCISES - Trochanteric Bursitis  These exercises may help you when beginning to rehabilitate your injury. Your symptoms may resolve with or without further involvement from your physician, physical therapist, or athletic trainer. While completing these exercises, remember:   Restoring tissue flexibility helps normal motion to return to the joints. This allows healthier, less painful movement and activity.  An effective stretch should be held for at least 30 seconds.  A stretch should never be painful. You should only feel a gentle lengthening or release in the stretched tissue. STRETCH - Iliotibial Band  On the floor or bed, lie on your side so your injured leg is on top. Bend your knee and grab your ankle.  Slowly bring your knee back so that your thigh is in line with your trunk. Keep your heel at your buttocks and gently arch your back so your head, shoulders and hips line up.  Slowly lower your leg so that your knee approaches the floor/bed until you feel a gentle stretch on the outside of your thigh. If you do not feel a stretch and your knee will not fall farther, place the heel of your opposite foot on top of your knee and pull your thigh down farther.  Hold this stretch for __________ seconds.  Repeat __________ times. Complete this exercise __________ times per day. STRETCH - Hamstrings, Supine   Lie on  your back. Loop a belt or towel over the ball of your foot as shown.  Straighten your knee and slowly pull on the belt to raise your injured leg. Do not allow the knee to bend. Keep your opposite leg flat on the floor.  Raise the leg until you feel a gentle stretch behind your knee or thigh. Hold this position for __________ seconds.  Repeat __________ times. Complete this stretch __________ times per day. STRETCH - Quadriceps, Prone   Lie on your stomach on a firm surface, such as a bed or padded floor.  Bend your knee and grasp your ankle. If you are unable to reach your ankle or pant leg, use a belt around your foot to lengthen your reach.  Gently pull your heel toward your buttocks. Your knee should not slide out to the side. You should feel a stretch in the front of your thigh and/or knee.  Hold this position for __________ seconds.  Repeat __________ times. Complete this stretch __________ times per day. STRETCHING - Hip Flexors, Lunge Half kneel with your knee on the floor and your opposite knee bent and directly over your ankle.  Keep good posture with your head over your shoulders. Tighten your buttocks to point your tailbone downward; this will prevent your back from arching too much.  You should feel a gentle stretch in the front of your thigh and/or hip. If you do not feel any resistance, slightly slide your opposite foot forward and then slowly lunge forward  so your knee once again lines up over your ankle. Be sure your tailbone remains pointed downward.  Hold this stretch for __________ seconds.  Repeat __________ times. Complete this stretch __________ times per day. STRETCH - Adductors, Lunge  While standing, spread your legs.  Lean away from your injured leg by bending your opposite knee. You may rest your hands on your thigh for balance.  You should feel a stretch in your inner thigh. Hold for __________ seconds.  Repeat __________ times. Complete this exercise  __________ times per day. Document Released: 10/26/2004 Document Revised: 02/02/2014 Document Reviewed: 12/31/2008 [facility] Patient Information 2015 [place], [place]. This information is not intended to replace advice given to you by your health care Jaykub Mackins. Make sure you discuss any questions you have with your health care Leamon Palau.

## 2015-07-02 ENCOUNTER — Telehealth: Payer: Self-pay

## 2015-07-02 NOTE — Telephone Encounter (Signed)
Patient came in concerned about reddened area around her flu vaccine injection---appx 3 cm diameter, no induration, no itching, no fever, no shortness of breath, Susan/RN and Tamara/RN both looked at injection site, and advised patient that this can happen, usually subsides within 48 to 72 hours, can use ice pack for comfort, advised patient to call back if any further problems result

## 2015-08-06 ENCOUNTER — Other Ambulatory Visit: Payer: Self-pay | Admitting: Endocrinology

## 2015-08-06 DIAGNOSIS — E890 Postprocedural hypothyroidism: Secondary | ICD-10-CM

## 2015-08-10 ENCOUNTER — Ambulatory Visit
Admission: RE | Admit: 2015-08-10 | Discharge: 2015-08-10 | Disposition: A | Discharge status: Home / Self Care | Payer: BLUE CROSS/BLUE SHIELD | Source: Ambulatory Visit | Attending: Endocrinology | Admitting: Endocrinology

## 2015-08-10 DIAGNOSIS — E890 Postprocedural hypothyroidism: Secondary | ICD-10-CM

## 2015-08-10 LAB — HM MAMMOGRAPHY

## 2015-08-10 LAB — HM DEXA SCAN

## 2015-08-13 ENCOUNTER — Telehealth: Payer: Self-pay | Admitting: Internal Medicine

## 2015-08-13 NOTE — Telephone Encounter (Signed)
Pt request her bone density result that was done 08/10/15.   Phone # [phone]

## 2015-08-16 NOTE — Telephone Encounter (Signed)
There is an ultrasound that was ordered by Dr. Forde Dandy for her thyroid, however the bone scan has not been completed.

## 2015-08-16 NOTE — Telephone Encounter (Signed)
Patricia Greene ordered the imaging. He would have the result.

## 2015-08-17 NOTE — Telephone Encounter (Signed)
Spoke to pt. The results have not been sent to [place] from Dr. Curt Jews office. Requested pt have them fax. Sent pt a my chart message with our fax number [phone]).

## 2015-09-03 ENCOUNTER — Encounter: Payer: Self-pay | Admitting: Family

## 2015-09-03 ENCOUNTER — Telehealth: Payer: Self-pay | Admitting: *Deleted

## 2015-09-03 NOTE — Telephone Encounter (Signed)
Please inform patient that her bone scan shows that she has osteopenia or low bone mineral density. The treatment recommended at this time is weight bearing exercise and intake of 1200 mg daily of calcium and 800 IU of vitamin D daily. Overall there was improvement since her last scan. Follow up for additional scan in 2 years.

## 2015-09-03 NOTE — Telephone Encounter (Signed)
Pt left msg on triage stating she never received her bone density results. [facility] stated results was sent to [facility]. Pls advise...Johny Chess

## 2015-09-06 ENCOUNTER — Encounter: Payer: Self-pay | Admitting: Internal Medicine

## 2015-09-06 NOTE — Telephone Encounter (Signed)
Called pt no answer LMOM with Greg response.../lmb 

## 2015-09-07 ENCOUNTER — Encounter: Payer: Self-pay | Admitting: Internal Medicine

## 2015-09-15 ENCOUNTER — Other Ambulatory Visit: Payer: Self-pay | Admitting: Internal Medicine

## 2015-09-15 ENCOUNTER — Other Ambulatory Visit (INDEPENDENT_AMBULATORY_CARE_PROVIDER_SITE_OTHER): Payer: BLUE CROSS/BLUE SHIELD

## 2015-09-15 ENCOUNTER — Ambulatory Visit (INDEPENDENT_AMBULATORY_CARE_PROVIDER_SITE_OTHER): Payer: BLUE CROSS/BLUE SHIELD | Admitting: Internal Medicine

## 2015-09-15 ENCOUNTER — Ambulatory Visit (INDEPENDENT_AMBULATORY_CARE_PROVIDER_SITE_OTHER)
Admission: RE | Admit: 2015-09-15 | Discharge: 2015-09-15 | Disposition: A | Discharge status: Home / Self Care | Payer: BLUE CROSS/BLUE SHIELD | Source: Ambulatory Visit | Attending: Internal Medicine | Admitting: Internal Medicine

## 2015-09-15 ENCOUNTER — Encounter: Payer: Self-pay | Admitting: Internal Medicine

## 2015-09-15 VITALS — BP 108/64 | HR 81 | Temp 97.9°F | Resp 16 | Wt 224.0 lb

## 2015-09-15 DIAGNOSIS — M79672 Pain in left foot: Secondary | ICD-10-CM

## 2015-09-15 DIAGNOSIS — E890 Postprocedural hypothyroidism: Secondary | ICD-10-CM

## 2015-09-15 DIAGNOSIS — M2550 Pain in unspecified joint: Secondary | ICD-10-CM

## 2015-09-15 DIAGNOSIS — M79671 Pain in right foot: Secondary | ICD-10-CM

## 2015-09-15 DIAGNOSIS — M25561 Pain in right knee: Secondary | ICD-10-CM | POA: Diagnosis not present

## 2015-09-15 DIAGNOSIS — R5383 Other fatigue: Secondary | ICD-10-CM | POA: Diagnosis not present

## 2015-09-15 LAB — C-REACTIVE PROTEIN: CRP: 0.4 mg/dL — AB (ref 0.5–20.0)

## 2015-09-15 LAB — SEDIMENTATION RATE: Sed Rate: 13 mm/hr (ref 0–22)

## 2015-09-15 MED ORDER — MELOXICAM 15 MG PO TABS: 15.0000 mg | ORAL_TABLET | Freq: Every day | ORAL | Status: DC

## 2015-09-15 NOTE — Progress Notes (Signed)
Pre visit review using our clinic review tool, if applicable. No additional management support is needed unless otherwise documented below in the visit note. 

## 2015-09-15 NOTE — Patient Instructions (Addendum)
  We have reviewed your prior records including labs and tests today.  Test(s) ordered today. Your results will be released to [place] (or called to you) after review, usually within 72hours after test completion. If any changes need to be made, you will be notified at that same time.  Start exercising regularly, work on weight loss.    Medications reviewed and updated.  We will restart the meloxicam daily.    Your prescription(s) have been submitted to your pharmacy. Please take as directed and contact our office if you believe you are having problem(s) with the medication(s).   Please schedule an appointment with Dr Tamala Julian

## 2015-09-15 NOTE — Progress Notes (Signed)
Subjective:    Patient ID: Patricia Greene, female    DOB: 05/15/1956, 59 y.o.   MRN: [id number]  HPI She is here for joint pain.   Hypothyroidism:  She is following with endocrine.  Her thyroid function was off for the first time in years.  She does not understand why her thyroid function was off when it had been so stable.  Her endocrinologist could not explain it to her, so she has decided to try a integrative doctor and is now on a compounded thyroid medicaton.   iShe has been losing some hair and has had some dry skin. She thinks this has slowed down.  She has no energy and has had increased irritability. Her sleep was bad, but is better in the past two weeks.   She has gained 15 lbs since march.  She is not exercising regularly.      Fibromyalgia:  She has fibromyalgia. She has seen a rheumatologist in the past, but has not seen her for years. She has been experiencing flank pain recently and was unsure was related fibromyalgia or something else. She has significant stiffness in the morning. She has been experiencing hip pain in feet and ankle pain. The past couple weeks she has also been experiencing bilateral knee pain. Her right knee feels swollen, but she denies any other swollen joints. She did take meloxicam for one month and improved her pain. She was told she probably had bursitis. She was having pain when she laid on either hip. She does notice some swelling in her ankles and feet at times. Her joint pain waxes and wanes and seems to be more in the evening and at night. It has woken her up. She is currently not taking any anti-inflammatories or Tylenol.  Feet, toes and fingers most of hte knee.  Stiffness inmoring.   Right knee pain and feels swollen. No other swollen joints.   Pain in feet, knee and hips.  Pain waxes and wanes,. More in evening and nght.  Consistent pain for a copule of weeks.  Start a couple of months ago.  Pain waking her up.   She currently denies any hand,  elbow, shoulder pain.  Medications and allergies reviewed with patient and updated if appropriate.  Patient Active Problem List   Diagnosis Date Noted  . Osteopenia 02/01/2015  . Allergic rhinitis, cause unspecified 03/11/2011  . VITAMIN D DEFICIENCY 03/13/2010  . HYPERCHOLESTEROLEMIA, BORDERLINE 03/13/2010  . MIGRAINE HEADACHE 03/13/2010  . CAROTID BRUIT 03/13/2010  . Hypothyroidism 03/09/2010  . NECK PAIN 02/25/2008  . INSOMNIA, CHRONIC 02/20/2008  . ANXIETY 09/06/2007  . FIBROMYALGIA 09/06/2007  . IRRITABLE BOWEL SYNDROME, HX OF 09/06/2007    Current Outpatient Prescriptions on File Prior to Visit  Medication Sig Dispense Refill  . CALCIUM PO Take by mouth. 3 tabs in the morning 2 tabs at lunch     . Cholecalciferol (VITAMIN D PO) Take by mouth. 2000 units daily     . liothyronine (CYTOMEL) 25 MCG tablet 1/2 daily     . LORazepam (ATIVAN) 1 MG tablet Take 1 tablet (1 mg total) by mouth 2 (two) times daily as needed for anxiety. 60 tablet 5  . Multiple Vitamins-Minerals (CENTRUM SILVER) tablet Take 1 tablet by mouth daily.      Marland Kitchen SYNTHROID 100 MCG tablet TAKE ONE TABLET BY MOUTH DAILY AND AN EXTRA 1/2 TAB ON SUNDAYS.  5  . Vitamin D, Ergocalciferol, (DRISDOL) 50000 UNITS CAPS capsule TAKE 1  CAPSULE BY MOUTH WEEKLY  2   No current facility-administered medications on file prior to visit.    Past Medical History  Diagnosis Date  . Hypothyroidism     nodular goiter  . Cervicalgia   . Fibromyalgia   . Unspecified vitamin D deficiency   . History of migraine headaches   . Anxiety state, unspecified   . Insomnia   . Allergic rhinitis   . Carotid bruit   . IBS (irritable bowel syndrome)   . Diverticulosis   . Obesity     Past Surgical History  Procedure Laterality Date  . 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 Breast enhancement surgery    . Colonoscopy  1999, 2004 Sammuel Cooper), 08/09/2011    1999, 2004: diverticulosis    Social History   Social History    . Marital Status: Divorced    Spouse Name: N/A  . Number of Children: 0  . Years of Education: N/A   Occupational History  . RN    Social History Main Topics  . Smoking status: Never Smoker   . Smokeless tobacco: Never Used  . Alcohol Use: No     Comment: rare   . Drug Use: No  . Sexual Activity: Not Asked   Other Topics Concern  . None   Social History Narrative   2-3 caffeine drinks daily    Divorced, no children. 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, now at school system    Review of Systems  Constitutional: Positive for fatigue (low energy - new thyroid med x 1 week only). Negative for fever and chills.       Temperature varies - feels hot and then cold  Respiratory: Negative for cough, shortness of breath and wheezing.   Cardiovascular: Positive for leg swelling (occasional feet/ankle). Negative for chest pain and palpitations.  Musculoskeletal: Positive for joint swelling and arthralgias (hips, knee, ankles). Negative for myalgias (now - no, but sometimes) and back pain.  Neurological: Positive for headaches. Negative for dizziness and light-headedness.       Objective:   Filed Vitals:   09/15/15 1451  BP: 108/64  Pulse: 81  Temp: 97.9 F (36.6 C)  Resp: 16   Filed Weights   09/15/15 1451  Weight: 224 lb (101.606 kg)   Body mass index is 36.17 kg/(m^2).   Physical Exam  Constitutional: She appears well-developed and well-nourished. No distress.  Neck: Neck supple. No tracheal deviation present. No thyromegaly present.  Cardiovascular: Normal rate, regular rhythm and normal heart sounds.   No murmur heard. Pulmonary/Chest: Effort normal and breath sounds normal. No respiratory distress. She has no wheezes. She has no rales.  Musculoskeletal: She exhibits edema (mild ankle edema) and tenderness (right lateral arm, quads b/l, lateral aspects of hips, no back muscular pain, no calf pain).  Lymphadenopathy:    She has no cervical  adenopathy.  Skin: No rash noted. She is not diaphoretic.  Psychiatric: She has a normal mood and affect. Her behavior is normal.          Assessment & Plan:   Joint pain, multiple joints Involving bilateral hips, knees, ankles and feet Possibly multifactorial Anti-inflammatories did help her hip pain and she does likely have some degree bursitis She has not been exercising and has seen week and that is probably affecting the joints as well I do not think she has an autoimmune disease, will get blood work to rule that out I do not  think her symptoms are related to her fibromyalgia because her muscles are not diffusely tender in this is not consistent with prior flares of fibromyalgia X-ray of the right knee and bilateral feet ordered-most likely there is going to be some degree of arthritis Will schedule an appointment with Dr. Tamala Julian for further evaluation Stressed the need for regular exercise and ideally weight  Meloxicam has helped and we will restart the. Discussed that ideally we do not want to take this long-term, but short-term and the benefits outweigh the risks

## 2015-09-16 ENCOUNTER — Encounter: Payer: Self-pay | Admitting: Internal Medicine

## 2015-09-16 LAB — ANA: ANA: NEGATIVE

## 2015-09-16 LAB — RHEUMATOID FACTOR: Rhuematoid fact SerPl-aCnc: <10 IU/mL (ref <=14)

## 2015-09-20 NOTE — Telephone Encounter (Signed)
Erroneous

## 2015-09-21 ENCOUNTER — Telehealth: Payer: Self-pay | Admitting: Internal Medicine

## 2015-09-21 ENCOUNTER — Other Ambulatory Visit: Payer: Self-pay | Admitting: Emergency Medicine

## 2015-09-21 MED ORDER — LORAZEPAM 1 MG PO TABS: 1.0000 mg | ORAL_TABLET | Freq: Two times a day (BID) | ORAL | Status: DC | PRN

## 2015-09-21 NOTE — Telephone Encounter (Signed)
Faxed to [facility] 

## 2015-09-21 NOTE — Telephone Encounter (Signed)
States pharmacy told her she had to come in to get refill on lorazepam.  States she was in last week to see Dr. Quay Burow.  Would like refill [facility] to [facility]

## 2015-09-21 NOTE — Telephone Encounter (Signed)
ok 

## 2015-09-21 NOTE — Telephone Encounter (Signed)
Okay to refill? 

## 2015-10-07 ENCOUNTER — Ambulatory Visit: Payer: BLUE CROSS/BLUE SHIELD | Admitting: Family Medicine

## 2015-10-18 ENCOUNTER — Ambulatory Visit (INDEPENDENT_AMBULATORY_CARE_PROVIDER_SITE_OTHER): Payer: BLUE CROSS/BLUE SHIELD | Admitting: Family Medicine

## 2015-10-18 ENCOUNTER — Encounter: Payer: Self-pay | Admitting: Family Medicine

## 2015-10-18 VITALS — BP 118/84 | HR 78 | Ht 66.0 in | Wt 224.0 lb

## 2015-10-18 DIAGNOSIS — M8580 Other specified disorders of bone density and structure, unspecified site: Secondary | ICD-10-CM

## 2015-10-18 DIAGNOSIS — E559 Vitamin D deficiency, unspecified: Secondary | ICD-10-CM | POA: Diagnosis not present

## 2015-10-18 DIAGNOSIS — M2550 Pain in unspecified joint: Secondary | ICD-10-CM | POA: Diagnosis not present

## 2015-10-18 MED ORDER — VITAMIN D (ERGOCALCIFEROL) 1.25 MG (50000 UNIT) PO CAPS: 50000.0000 [IU] | ORAL_CAPSULE | ORAL | Status: DC

## 2015-10-18 NOTE — Patient Instructions (Addendum)
Good to see you Thanks for fixing yourself Refilled the vitamin D once weekly for another 12 weeks Conitnue the thyroid Consider Fish oil 2 grams daily DHEA 50mg  daily for 30 days to jump start the adrenal glands Try to find time for yourself See me again in 4 weeks if needed Happy New Year!

## 2015-10-18 NOTE — Assessment & Plan Note (Signed)
Encourage vitamin D supple mentation. Refill given today.

## 2015-10-18 NOTE — Progress Notes (Signed)
[facility] [address], [place] [address] Phone: [phone] Subjective:    I'm seeing this patient by the request  of:  Binnie Rail, MD   CC: Polyarthralgia  [id number] Patricia Greene is a 60 y.o. female coming in with complaint of polyarthralgia. Patient has had multiple 8 pain for multiple months. Patient does have a history of thyroidectomy. Patient was taking different thyroid medication and noticed that she actually was getting worse. Patient's TSH started to increase. Patient then switched to a natural thyroid medication. Since then she's been feeling significantly better. States that she does not have swelling around the joints. States that the pain is significantly improved as well. States that she is feeling more like herself. Still having significant fatigue in stress in her life. 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 Denies any specific joint pain at this time. Also has a history of severe vitamin D deficiency. Patient has not been taking it once weekly vitamin D.    patient has had x-rays or anemia showing moderate osteoarthritic changes but states that she is not having severe pain. These x-rays were independently visualized today.  Past Medical History  Diagnosis Date  . Hypothyroidism     nodular goiter  . Cervicalgia   . Fibromyalgia   . Unspecified vitamin D deficiency   . History of migraine headaches   . Anxiety state, unspecified   . Insomnia   . Allergic rhinitis   . Carotid bruit   . IBS (irritable bowel syndrome)   . Diverticulosis   . Obesity    Past Surgical History  Procedure Laterality Date  . 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 Breast enhancement surgery    . Colonoscopy  1999, 2004 Sammuel Cooper), 08/09/2011    1999, 2004: diverticulosis   Social History   Social History  . Marital Status: Divorced    Spouse Name: N/A  . Number of Children: 0  . Years of Education:  N/A   Occupational History  . RN    Social History Main Topics  . Smoking status: Never Smoker   . Smokeless tobacco: Never Used  . Alcohol Use: No     Comment: rare   . Drug Use: No  . Sexual Activity: Not Asked   Other Topics Concern  . None   Social History Narrative   2-3 caffeine drinks daily    Divorced, no children. 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, now at school system   No Known Allergies Family History  Problem Relation Age of Onset  . Asthma Mother   . Breast cancer Mother   . Allergic rhinitis Mother   . Heart disease Mother   . Colon cancer Father 26    died at 36  . Colon polyps Sister   . Esophageal cancer Neg Hx   . Stomach cancer Neg Hx     Past medical history, social, surgical and family history all reviewed in electronic medical record.  No pertanent information unless stated regarding to the chief complaint.   Review of Systems: No headache, visual changes, nausea, vomiting, diarrhea, constipation, dizziness, abdominal pain, skin rash, fevers, chills, night sweats, weight loss, swollen lymph nodes, body aches, joint swelling, muscle aches, chest pain, shortness of breath, mood changes.   Objective Blood pressure 118/84, pulse 78, height 5\' 6"  (1.676 m), weight 224 lb (101.606 kg), SpO2 95 %.  General: No apparent distress  alert and oriented x3 mood and affect normal, dressed appropriately.  Overweight  HEENT: Pupils equal, extraocular movements intact  Respiratory: Patient's speak in full sentences and does not appear short of breath  Cardiovascular: No lower extremity edema, non tender, no erythema  Skin: Warm dry intact with no signs of infection or rash on extremities or on axial skeleton.  Abdomen: Soft nontender  Neuro: Cranial nerves II through XII are intact, neurovascularly intact in all extremities with 2+ DTRs and 2+ pulses.  Lymph: No lymphadenopathy of posterior or anterior cervical chain or axillae  bilaterally.  Gait normal with good balance and coordination.  MSK:  Non tender with full range of motion and good stability and symmetric strength and tone of shoulders, elbows, wrist, hip, knee and ankles bilaterally.  No significant findings on physical exam today.   Impression and Recommendations:     This case required medical decision making of moderate complexity.      Note: This dictation was prepared with Dragon dictation along with smaller phrase technology. Any transcriptional errors that result from this process are unintentional.

## 2015-10-18 NOTE — Assessment & Plan Note (Signed)
Having symptoms were more secondary to team thyroid disease been truly fibromyalgia at this point.

## 2015-10-18 NOTE — Progress Notes (Signed)
Pre visit review using our clinic review tool, if applicable. No additional management support is needed unless otherwise documented below in the visit note. 

## 2015-10-18 NOTE — Assessment & Plan Note (Signed)
Refill medication today.

## 2015-10-18 NOTE — Assessment & Plan Note (Signed)
Patient has had some inflammation and irritation of multiple joints previously. Patient though is feeling much better after optimizing her thyroid. Patient is taking the medication with no significant side effects. We discussed other possible natural supplements that could be beneficial for short term uses. We discussed how this can be helpful to this individual. Lungs patient continues to do well she can follow-up as needed otherwise she will follow-up in 4 weeks if any other changes need to be made.

## 2015-11-15 ENCOUNTER — Ambulatory Visit: Payer: BLUE CROSS/BLUE SHIELD | Admitting: Family Medicine

## 2015-12-16 ENCOUNTER — Telehealth: Payer: Self-pay | Admitting: Internal Medicine

## 2016-01-18 LAB — HM PAP SMEAR: HM PAP: NEGATIVE

## 2016-03-26 ENCOUNTER — Other Ambulatory Visit: Payer: Self-pay | Admitting: Internal Medicine

## 2016-03-27 NOTE — Telephone Encounter (Signed)
RX faxed to [facility] 

## 2016-04-14 ENCOUNTER — Ambulatory Visit (INDEPENDENT_AMBULATORY_CARE_PROVIDER_SITE_OTHER): Payer: BLUE CROSS/BLUE SHIELD | Admitting: Internal Medicine

## 2016-04-14 VITALS — BP 122/74 | HR 78 | Temp 97.6°F | Resp 16 | Wt 232.0 lb

## 2016-04-14 DIAGNOSIS — N644 Mastodynia: Secondary | ICD-10-CM | POA: Diagnosis not present

## 2016-04-14 DIAGNOSIS — N63 Unspecified lump in unspecified breast: Secondary | ICD-10-CM

## 2016-04-14 DIAGNOSIS — Z9882 Breast implant status: Secondary | ICD-10-CM | POA: Diagnosis not present

## 2016-04-14 NOTE — Progress Notes (Signed)
Pre visit review using our clinic review tool, if applicable. No additional management support is needed unless otherwise documented below in the visit note. 

## 2016-04-14 NOTE — Progress Notes (Signed)
Subjective:    Patient ID: Patricia Greene, female    DOB: [birth year], 60 y.o.   MRN: [id number]  HPI She is here for an acute visit.   She had breast implants placed at age 87.  For the past three weeks she has had left breast tenderness, swelling and diffuse firmness.  Each breast has an area of firmness, but the left breast is now diffusely firm.  The tenderness extends to the clavicle and armpit.  She is concerned there may be a rupture of the implant.  She denies any trauma.  There are no skin changes.  She has seen a Psychiatric nurse and wants to have them removed, but they need to know if the implant has ruptured or not.  She does not feel well overall and is concerned that is from the implant.      She has examined her breasts and denies any discrete mass.  Her mammograms are up to date and her last one was normal.   Meddications and allergies reviewed with patient and updated if appropriate.  Patient Active Problem List   Diagnosis Date Noted  . Polyarthralgia 10/18/2015  . Osteopenia 02/01/2015  . Allergic rhinitis, cause unspecified 03/11/2011  . Vitamin D deficiency 03/13/2010  . HYPERCHOLESTEROLEMIA, BORDERLINE 03/13/2010  . MIGRAINE HEADACHE 03/13/2010  . CAROTID BRUIT 03/13/2010  . Hypothyroidism 03/09/2010  . NECK PAIN 02/25/2008  . INSOMNIA, CHRONIC 02/20/2008  . ANXIETY 09/06/2007  . FIBROMYALGIA 09/06/2007  . IRRITABLE BOWEL SYNDROME, HX OF 09/06/2007    Current Outpatient Prescriptions on File Prior to Visit  Medication Sig Dispense Refill  . CALCIUM PO Take by mouth. 3 tabs in the morning 2 tabs at lunch     . Cholecalciferol (VITAMIN D PO) Take by mouth. 2000 units daily     . LORazepam (ATIVAN) 1 MG tablet TAKE 1 TABLET BY MOUTH TWICE A DAY AS NEEDED ANXIETY 60 tablet 2  . Multiple Vitamins-Minerals (CENTRUM SILVER) tablet Take 1 tablet by mouth daily.      Marland Kitchen NATURE-THROID 81.25 MG TABS   4  . Vitamin D, Ergocalciferol, (DRISDOL) 50000 units CAPS  capsule Take 1 capsule (50,000 Units total) by mouth every 7 (seven) days. 12 capsule 2   No current facility-administered medications on file prior to visit.    Past Medical History  Diagnosis Date  . Hypothyroidism     nodular goiter  . Cervicalgia   . Fibromyalgia   . Unspecified vitamin D deficiency   . History of migraine headaches   . Anxiety state, unspecified   . Insomnia   . Allergic rhinitis   . Carotid bruit   . IBS (irritable bowel syndrome)   . Diverticulosis   . Obesity     Past Surgical History  Procedure Laterality Date  . 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 Breast enhancement surgery    . Colonoscopy  1999, 2004 Sammuel Cooper), 08/09/2011    1999, 2004: diverticulosis    Social History   Social History  . Marital Status: Divorced    Spouse Name: N/A  . Number of Children: 0  . Years of Education: N/A   Occupational History  . RN    Social History Main Topics  . Smoking status: Never Smoker   . Smokeless tobacco: Never Used  . Alcohol Use: No     Comment: rare   . Drug Use: No  . Sexual Activity: Not on file   Other Topics  Concern  . Not on file   Social History Narrative   2-3 caffeine drinks daily    Divorced, no children. 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 Asthma Mother   . Breast cancer Mother   . Allergic rhinitis Mother   . Heart disease Mother   . Colon cancer Father 30    died at 55  . Colon polyps Sister   . Esophageal cancer Neg Hx   . Stomach cancer Neg Hx     Review of Systems  Constitutional: Positive for fatigue. Negative for fever.  Skin: Negative for color change and rash.       Objective:   Filed Vitals:   04/14/16 1636  BP: 122/74  Pulse: 78  Temp: 97.6 F (36.4 C)  Resp: 16   Filed Weights   04/14/16 1636  Weight: 232 lb (105.235 kg)   Body mass index is 37.46 kg/(m^2).   Physical Exam    Constitutional: She appears well-developed and well-nourished. No distress.  Pulmonary/Chest: Right breast exhibits no inverted nipple, no mass, no nipple discharge, no skin change and no tenderness. Left breast exhibits tenderness (diffuse tenderness and firmness ). Left breast exhibits no inverted nipple, no mass, no nipple discharge and no skin change. Breasts are asymmetrical (slight swelling of left breast).  Skin: Skin is warm and dry. She is not diaphoretic.       Assessment & Plan:   See Problem List for Assessment and Plan of chronic medical problems.

## 2016-04-14 NOTE — Patient Instructions (Signed)
An MRI of your breast was ordered.  You should be called on Monday to have this scheduled.

## 2016-04-15 ENCOUNTER — Encounter: Payer: Self-pay | Admitting: Internal Medicine

## 2016-04-15 DIAGNOSIS — Z9882 Breast implant status: Secondary | ICD-10-CM | POA: Insufficient documentation

## 2016-04-15 DIAGNOSIS — N644 Mastodynia: Secondary | ICD-10-CM | POA: Insufficient documentation

## 2016-04-15 NOTE — Assessment & Plan Note (Signed)
Has breast implants and has had 3 weeks of left breast swelling, firmness, pain that extends to clavicle and axilla On exam her left breast is tender diffusely, slightly swollen and there is increased firmness compared to the right Concern for implant rupture Will obtain an MRI as soon as possible - may need to have them removed MRI ordered

## 2016-04-17 ENCOUNTER — Telehealth: Payer: Self-pay | Admitting: Emergency Medicine

## 2016-04-17 ENCOUNTER — Other Ambulatory Visit: Payer: Self-pay | Admitting: Internal Medicine

## 2016-04-17 DIAGNOSIS — N644 Mastodynia: Secondary | ICD-10-CM

## 2016-04-17 DIAGNOSIS — N63 Unspecified lump in unspecified breast: Secondary | ICD-10-CM

## 2016-04-17 DIAGNOSIS — Z9886 Personal history of breast implant removal: Secondary | ICD-10-CM

## 2016-04-17 NOTE — Telephone Encounter (Signed)
Pt called in stated MRI needed to stat w and wo contrast.

## 2016-04-18 ENCOUNTER — Telehealth: Payer: Self-pay | Admitting: Emergency Medicine

## 2016-04-18 ENCOUNTER — Ambulatory Visit
Admission: RE | Admit: 2016-04-18 | Discharge: 2016-04-18 | Disposition: A | Discharge status: Home / Self Care | Payer: BLUE CROSS/BLUE SHIELD | Source: Ambulatory Visit | Attending: Internal Medicine | Admitting: Internal Medicine

## 2016-04-18 DIAGNOSIS — Z9882 Breast implant status: Secondary | ICD-10-CM

## 2016-04-18 DIAGNOSIS — Z9886 Personal history of breast implant removal: Secondary | ICD-10-CM

## 2016-04-18 NOTE — Telephone Encounter (Signed)
[facility] from [facility], needing to clarify that pt is only coming in for MRI to rule out Breast implant rupture. PER MD only wants to rule out. Orders changed to MRI WO contrast.

## 2016-04-20 ENCOUNTER — Encounter: Payer: Self-pay | Admitting: Internal Medicine

## 2016-04-20 DIAGNOSIS — R928 Other abnormal and inconclusive findings on diagnostic imaging of breast: Secondary | ICD-10-CM | POA: Insufficient documentation

## 2016-04-21 ENCOUNTER — Other Ambulatory Visit: Payer: Self-pay | Admitting: Internal Medicine

## 2016-04-21 ENCOUNTER — Telehealth: Payer: Self-pay | Admitting: Emergency Medicine

## 2016-04-21 DIAGNOSIS — R928 Other abnormal and inconclusive findings on diagnostic imaging of breast: Secondary | ICD-10-CM

## 2016-04-21 NOTE — Telephone Encounter (Signed)
Not sure if this can happen this early.. You make be able to put it in the order.Marland Kitchen

## 2016-04-21 NOTE — Telephone Encounter (Signed)
Pt called and stated she wanted to try and have her breast exam done Monday or tues if possible thanks.

## 2016-04-22 NOTE — Telephone Encounter (Signed)
ordered

## 2016-04-25 ENCOUNTER — Other Ambulatory Visit: Payer: BLUE CROSS/BLUE SHIELD

## 2016-04-28 ENCOUNTER — Other Ambulatory Visit: Payer: Self-pay | Admitting: Internal Medicine

## 2016-04-28 ENCOUNTER — Ambulatory Visit
Admission: RE | Admit: 2016-04-28 | Discharge: 2016-04-28 | Disposition: A | Discharge status: Home / Self Care | Payer: BLUE CROSS/BLUE SHIELD | Source: Ambulatory Visit | Attending: Internal Medicine | Admitting: Internal Medicine

## 2016-04-28 DIAGNOSIS — R928 Other abnormal and inconclusive findings on diagnostic imaging of breast: Secondary | ICD-10-CM

## 2016-04-28 MED ORDER — GADOBENATE DIMEGLUMINE 529 MG/ML IV SOLN
20.0000 mL | Freq: Once | INTRAVENOUS | Status: AC | PRN
  Administered 2016-04-28: 20 mL via INTRAVENOUS

## 2016-05-15 ENCOUNTER — Telehealth: Payer: Self-pay | Admitting: Emergency Medicine

## 2016-05-15 NOTE — Telephone Encounter (Signed)
Faxed

## 2016-05-15 NOTE — Telephone Encounter (Signed)
[facility] wants to know if you can fax over a copy of the patients mammogram from November. Fax #: [phone] Please advise thanks.

## 2016-07-03 ENCOUNTER — Encounter: Payer: BLUE CROSS/BLUE SHIELD | Admitting: Internal Medicine

## 2016-07-28 ENCOUNTER — Other Ambulatory Visit: Payer: Self-pay | Admitting: Family Medicine

## 2016-08-29 ENCOUNTER — Encounter: Payer: Self-pay | Admitting: Internal Medicine

## 2016-08-30 ENCOUNTER — Encounter: Payer: Self-pay | Admitting: Internal Medicine

## 2016-09-01 ENCOUNTER — Ambulatory Visit (INDEPENDENT_AMBULATORY_CARE_PROVIDER_SITE_OTHER): Payer: BC Managed Care – PPO | Admitting: Nurse Practitioner

## 2016-09-01 ENCOUNTER — Encounter: Payer: Self-pay | Admitting: Nurse Practitioner

## 2016-09-01 VITALS — BP 114/70 | HR 82 | Temp 98.1°F | Wt 229.0 lb

## 2016-09-01 DIAGNOSIS — J0100 Acute maxillary sinusitis, unspecified: Secondary | ICD-10-CM | POA: Diagnosis not present

## 2016-09-01 DIAGNOSIS — J209 Acute bronchitis, unspecified: Secondary | ICD-10-CM

## 2016-09-01 MED ORDER — AMOXICILLIN-POT CLAVULANATE 875-125 MG PO TABS: 1.0000 | ORAL_TABLET | Freq: Two times a day (BID) | ORAL | 0 refills | Status: DC

## 2016-09-01 MED ORDER — BENZONATATE 100 MG PO CAPS: 100.0000 mg | ORAL_CAPSULE | Freq: Three times a day (TID) | ORAL | 0 refills | Status: DC | PRN

## 2016-09-01 MED ORDER — ALBUTEROL SULFATE HFA 108 (90 BASE) MCG/ACT IN AERS: 2.0000 | INHALATION_SPRAY | Freq: Four times a day (QID) | RESPIRATORY_TRACT | 0 refills | Status: DC | PRN

## 2016-09-01 MED ORDER — OXYMETAZOLINE HCL 0.05 % NA SOLN: 1.0000 | Freq: Two times a day (BID) | NASAL | 0 refills | Status: DC

## 2016-09-01 MED ORDER — DM-GUAIFENESIN ER 30-600 MG PO TB12: 1.0000 | ORAL_TABLET | Freq: Two times a day (BID) | ORAL | 0 refills | Status: DC | PRN

## 2016-09-01 MED ORDER — FLUTICASONE PROPIONATE 50 MCG/ACT NA SUSP: 2.0000 | Freq: Every day | NASAL | 0 refills | Status: DC

## 2016-09-01 NOTE — Patient Instructions (Signed)
[facility] Instructions: Flonase and Afrin use: apply 1spray of afrin in each nare, wait 5mins, then apply 2sprays of flonase in each nare. Use both nasal spray consecutively x 3days, then flonase only for at least 14days.  Use over-the-counter  "cold" medicines  such as "Tylenol cold" , "Advil cold",  "Mucinex" or" Mucinex D"  for cough and congestion.  Avoid decongestants if you have high blood pressure. Use" Delsym" or" Robitussin" cough syrup varietis for cough.  You can use plain "Tylenol" or "Advi"l for fever, chills and achyness.   Encourage adequate oral hydration. Return to office if no improvement in2weeks 

## 2016-09-01 NOTE — Progress Notes (Signed)
Subjective:  Patient ID: Patricia Greene, female    DOB: March 27, 1956  Age: 60 y.o. MRN: [id number]  CC: Sore Throat (sore throat,ear stop up,chest tightness,cough at times for 1 wk. )   Sinus Problem  This is a new problem. The current episode started 1 to 4 weeks ago. The problem has been gradually worsening since onset. There has been no fever. The pain is severe. Associated symptoms include chills, congestion, coughing, ear pain, headaches, a hoarse voice, shortness of breath, sinus pressure, sneezing, a sore throat and swollen glands. Pertinent negatives include no diaphoresis. Past treatments include acetaminophen, lying down and oral decongestants. The treatment provided mild relief.    Outpatient Medications Prior to Visit  Medication Sig Dispense Refill  . CALCIUM PO Take by mouth. 3 tabs in the morning 2 tabs at lunch     . Cholecalciferol (VITAMIN D PO) Take by mouth. 2000 units daily     . LORazepam (ATIVAN) 1 MG tablet TAKE 1 TABLET BY MOUTH TWICE A DAY AS NEEDED ANXIETY 60 tablet 2  . Multiple Vitamins-Minerals (CENTRUM SILVER) tablet Take 1 tablet by mouth daily.      Marland Kitchen NATURE-THROID 81.25 MG TABS   4  . Vitamin D, Ergocalciferol, (DRISDOL) 50000 units CAPS capsule TAKE ONE CAPSULE BY MOUTH EVERY 7 DAYS 12 capsule 2   No facility-administered medications prior to visit.     ROS See HPI  Objective:  BP 114/70   Pulse 82   Temp 98.1 F (36.7 C)   Wt 229 lb (103.9 kg)   SpO2 98%   BMI 36.96 kg/m   BP Readings from Last 3 Encounters:  09/01/16 114/70  04/14/16 122/74  10/18/15 118/84    Wt Readings from Last 3 Encounters:  09/01/16 229 lb (103.9 kg)  04/18/16 232 lb (105.2 kg)  04/14/16 232 lb (105.2 kg)    Physical Exam  Constitutional: She is oriented to person, place, and time. No distress.  HENT:  Right Ear: Tympanic membrane, external ear and ear canal normal. Tympanic membrane is not injected.  Left Ear: Tympanic membrane, external ear and ear  canal normal. Tympanic membrane is not injected.  Nose: Mucosal edema and rhinorrhea present. Right sinus exhibits maxillary sinus tenderness and frontal sinus tenderness. Left sinus exhibits maxillary sinus tenderness and frontal sinus tenderness.  Mouth/Throat: Uvula is midline. Posterior oropharyngeal erythema present. No oropharyngeal exudate.  Eyes: No scleral icterus.  Neck: Normal range of motion. Neck supple.  Cardiovascular: Normal rate and normal heart sounds.   Pulmonary/Chest: Effort normal and breath sounds normal. She has no wheezes. She has no rales.  Musculoskeletal: She exhibits no edema.  Lymphadenopathy:    She has cervical adenopathy.  Neurological: She is alert and oriented to person, place, and time.  Skin: Skin is warm and dry.  Vitals reviewed.   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 An irregular region of high T2 was seen in the inner left breast at that time and a contrast-enhanced MRI was recommended prior to implant replacement. After the contrast-enhanced MRI was performed, a mammogram was recommended by Dr. Glennon Mac. When the patient presented to Dr. Barbette Hair for mammography, she was very hesitant due to the intracapsular rupture. After a thorough discussion with Dr. Barbette Hair, it was decided to forego the mammogram. I did compare this MRI to the November 2016 and November 2013 mammograms. LABS:   None EXAM: BILATERAL BREAST MRI WITH AND WITHOUT CONTRAST TECHNIQUE: Multiplanar, multisequence MR images of both breasts were obtained prior to and following the intravenous administration of 20 ml of MultiHance. THREE-DIMENSIONAL MR IMAGE RENDERING ON INDEPENDENT WORKSTATION: Three-dimensional MR images were rendered by post-processing of the original MR data on an independent workstation. The three-dimensional MR images were interpreted, and findings are reported in the following complete MRI report for this study. Three dimensional images were evaluated at the independent Dyna[employer] workstation COMPARISON:  Mammograms from November 2016 and November 2013. Unenhanced breast MRI from April 18, 2016. FINDINGS: Breast composition: Scattered fibroglandular tissue Background parenchymal enhancement: Moderate. Right breast: The patient's known intracapsular rupture was better evaluated on the April 18, 2016 MRI. No suspicious masses or non mass enhancement are identified in the right breast. Left breast: The patient's known intracapsular rupture was more completely evaluated on the April 18, 2016 MRI. Several oval circumscribed minimally enhancing masses are seen in the left breast, consistent with a benign etiology. The largest at 12 o'clock has been present mammographically since 2013. No suspicious masses are non mass enhancement in the left breast. Lymph nodes: No abnormal appearing lymph nodes. Ancillary findings:  None. IMPRESSION: 1. No MRI evidence of malignancy. 2. The patient's known intracapsular implant ruptures were more completely discussed and evaluated on the April 18, 2016 MRI. RECOMMENDATION: The patient is proceeding to surgery for the intracapsular ruptures. The patient will have a mammogram after her surgery. BI-RADS CATEGORY  2: Benign. Electronically Signed   By: Dorise Bullion III M.D   On: 05/02/2016 09:40    Assessment & Plan:   Patricia Greene was seen today for sore throat.  Diagnoses and all orders  for this visit:  Acute non-recurrent maxillary sinusitis -     dextromethorphan-guaiFENesin (MUCINEX DM) 30-600 MG 12hr tablet; Take 1 tablet by mouth 2 (two) times daily as needed for cough. -     fluticasone (FLONASE) 50 MCG/ACT nasal spray; Place 2 sprays into both nostrils daily. -     oxymetazoline (AFRIN NASAL SPRAY) 0.05 % nasal spray; Place 1 spray into both nostrils 2 (two) times daily. Use only for 3days, then stop -     amoxicillin-clavulanate (AUGMENTIN) 875-125 MG tablet; Take 1 tablet by mouth 2 (two) times daily. -     benzonatate (TESSALON) 100 MG capsule; Take 1 capsule (100 mg total) by mouth 3 (three) times daily as needed for cough.  Acute bronchitis, unspecified organism -     dextromethorphan-guaiFENesin (MUCINEX DM) 30-600 MG 12hr tablet; Take 1 tablet by mouth 2 (two) times daily as needed for cough. -     fluticasone (FLONASE) 50 MCG/ACT nasal spray; Place 2 sprays into both nostrils daily. -     oxymetazoline (AFRIN NASAL SPRAY) 0.05 % nasal spray; Place 1 spray into both nostrils 2 (two) times daily. Use only for 3days, then stop -  amoxicillin-clavulanate (AUGMENTIN) 875-125 MG tablet; Take 1 tablet by mouth 2 (two) times daily. -     albuterol (PROVENTIL HFA;VENTOLIN HFA) 108 (90 Base) MCG/ACT inhaler; Inhale 2 puffs into the lungs every 6 (six) hours as needed for wheezing or shortness of breath. -     benzonatate (TESSALON) 100 MG capsule; Take 1 capsule (100 mg total) by mouth 3 (three) times daily as needed for cough.   I am having Ms. Susman start on dextromethorphan-guaiFENesin, fluticasone, oxymetazoline, amoxicillin-clavulanate, albuterol, and benzonatate. I am also having her maintain her CALCIUM PO, Cholecalciferol (VITAMIN D PO), CENTRUM SILVER, NATURE-THROID, LORazepam, and Vitamin D (Ergocalciferol).  Meds ordered this encounter  Medications  . dextromethorphan-guaiFENesin (MUCINEX DM) 30-600 MG 12hr tablet    Sig: Take 1 tablet by mouth 2 (two)  times daily as needed for cough.    Dispense:  14 tablet    Refill:  0    Order Specific Question:   Supervising Provider    Answer:   Cassandria Anger [[id number]]  . fluticasone (FLONASE) 50 MCG/ACT nasal spray    Sig: Place 2 sprays into both nostrils daily.    Dispense:  16 g    Refill:  0    Order Specific Question:   Supervising Provider    Answer:   Cassandria Anger [[id number]]  . oxymetazoline (AFRIN NASAL SPRAY) 0.05 % nasal spray    Sig: Place 1 spray into both nostrils 2 (two) times daily. Use only for 3days, then stop    Dispense:  30 mL    Refill:  0    Order Specific Question:   Supervising Provider    Answer:   Cassandria Anger [[id number]]  . amoxicillin-clavulanate (AUGMENTIN) 875-125 MG tablet    Sig: Take 1 tablet by mouth 2 (two) times daily.    Dispense:  10 tablet    Refill:  0    Order Specific Question:   Supervising Provider    Answer:   Cassandria Anger [[id number]]  . albuterol (PROVENTIL HFA;VENTOLIN HFA) 108 (90 Base) MCG/ACT inhaler    Sig: Inhale 2 puffs into the lungs every 6 (six) hours as needed for wheezing or shortness of breath.    Dispense:  1 Inhaler    Refill:  0    Order Specific Question:   Supervising Provider    Answer:   Cassandria Anger [[id number]]  . benzonatate (TESSALON) 100 MG capsule    Sig: Take 1 capsule (100 mg total) by mouth 3 (three) times daily as needed for cough.    Dispense:  20 capsule    Refill:  0    Order Specific Question:   Supervising Provider    Answer:   Cassandria Anger [[id number]]    Follow-up: No Follow-up on file.  Wilfred Lacy, NP

## 2016-09-01 NOTE — Progress Notes (Signed)
Pre visit review using our clinic review tool, if applicable. No additional management support is needed unless otherwise documented below in the visit note. 

## 2016-09-04 ENCOUNTER — Telehealth: Payer: Self-pay | Admitting: Nurse Practitioner

## 2016-09-04 MED ORDER — ALBUTEROL SULFATE HFA 108 (90 BASE) MCG/ACT IN AERS: 2.0000 | INHALATION_SPRAY | Freq: Four times a day (QID) | RESPIRATORY_TRACT | 0 refills | Status: DC | PRN

## 2016-09-04 NOTE — Telephone Encounter (Signed)
Error

## 2016-09-05 ENCOUNTER — Telehealth: Payer: Self-pay | Admitting: *Deleted

## 2016-09-05 MED ORDER — ALBUTEROL SULFATE HFA 108 (90 BASE) MCG/ACT IN AERS
2.0000 | INHALATION_SPRAY | Freq: Four times a day (QID) | RESPIRATORY_TRACT | 0 refills | Status: DC | PRN
Start: 2016-09-05 — End: 2016-09-26

## 2016-09-05 NOTE — Telephone Encounter (Signed)
Rec'd fax stating Ventolin HFA is not covered alternative is ProAir. Requesting rx for proair. Resent [facility] to [facility].Marland KitchenJohny Chess

## 2016-09-24 ENCOUNTER — Other Ambulatory Visit: Payer: Self-pay | Admitting: Internal Medicine

## 2016-09-26 ENCOUNTER — Ambulatory Visit (INDEPENDENT_AMBULATORY_CARE_PROVIDER_SITE_OTHER): Payer: BC Managed Care – PPO | Admitting: Internal Medicine

## 2016-09-26 ENCOUNTER — Encounter: Payer: Self-pay | Admitting: Internal Medicine

## 2016-09-26 DIAGNOSIS — R059 Cough, unspecified: Secondary | ICD-10-CM

## 2016-09-26 DIAGNOSIS — R051 Acute cough: Secondary | ICD-10-CM | POA: Insufficient documentation

## 2016-09-26 DIAGNOSIS — R05 Cough: Secondary | ICD-10-CM | POA: Diagnosis not present

## 2016-09-26 DIAGNOSIS — M2550 Pain in unspecified joint: Secondary | ICD-10-CM

## 2016-09-26 NOTE — Patient Instructions (Signed)
You can keep icing the knee and call us back if it starts to collapse on you. We can get you in with the sports medicine doctor to do an ultrasound on the tendons in the knee.  It is okay to take ibuprofen or naproxen for the pain and inflammation.  If the breathing gets worse or the cough gets worse call us back.

## 2016-09-26 NOTE — Assessment & Plan Note (Signed)
Lungs clear on exam, she has finished antibiotics and prednisone. Most of her symptoms are resolved. She does not need more medication today or chest x-ray.

## 2016-09-26 NOTE — Progress Notes (Signed)
   Subjective:    Patient ID: Patricia Greene, female    DOB: 30-Dec-1955, 60 y.o.   MRN: [id number]  HPI The patient is a 60 YO female coming in for fall. She fell on christmas eve due to wearing some new shoes with a platform. This caused her to trip and fall into a wall. She hit her ear, neck, hip, knees. She has had some pain in the knees and some instability of the right knee. She is having some bruising. No headaches, nausea.  She is also still having mild cough. She had infection in the beginning of December and was given an antibiotic. Then she went to urgent care within 24 hours. She was there given another antibiotic and prednisone. This has cleared up most of her symptoms. She is still having cough which is mostly non-productive.   Review of Systems  Constitutional: Negative for activity change, appetite change, fatigue, fever and unexpected weight change.  Respiratory: Positive for cough. Negative for chest tightness, shortness of breath and wheezing.   Cardiovascular: Negative for chest pain, palpitations and leg swelling.  Gastrointestinal: Negative.   Musculoskeletal: Positive for arthralgias and myalgias. Negative for back pain, gait problem, joint swelling, neck pain and neck stiffness.  Skin: Positive for color change.  Neurological: Negative.       Objective:   Physical Exam  Constitutional: She is oriented to person, place, and time. She appears well-developed and well-nourished.  HENT:  Head: Normocephalic and atraumatic.  Eyes: EOM are normal.  Neck: Normal range of motion.  Cardiovascular: Normal rate and regular rhythm.   Pulmonary/Chest: Effort normal and breath sounds normal. No respiratory distress. She has no wheezes. She has no rales. She exhibits no tenderness.  Abdominal: Soft. She exhibits no distension. There is no tenderness. There is no rebound.  Musculoskeletal: She exhibits no edema or tenderness.  ACL and PCL intact right knee, some question swelling  versus soft tissue right knee, bruising worse on the left knee.   Neurological: She is alert and oriented to person, place, and time. Coordination normal.  Skin: Skin is warm and dry.  Bruising on the right ear, right neck, bilateral knees   Vitals:   09/26/16 1603  BP: 116/60  Pulse: 78  Resp: 14  Temp: 98.4 F (36.9 C)  TempSrc: Oral  SpO2: 98%  Weight: 232 lb 12 oz (105.6 kg)  Height: 5\' 6"  (1.676 m)      Assessment & Plan:

## 2016-09-26 NOTE — Progress Notes (Signed)
Pre visit review using our clinic review tool, if applicable. No additional management support is needed unless otherwise documented below in the visit note. 

## 2016-09-26 NOTE — Assessment & Plan Note (Addendum)
Exacerbated by recent fall. No imaging indicated at today's visit. If she is still having instability of the right knee she will go to sports medicine for evaluation. Will use ice and ibuprofen for the pain.

## 2016-09-26 NOTE — Telephone Encounter (Signed)
RX faxed to [facility] 

## 2016-10-04 ENCOUNTER — Ambulatory Visit (INDEPENDENT_AMBULATORY_CARE_PROVIDER_SITE_OTHER): Payer: BC Managed Care – PPO | Admitting: Internal Medicine

## 2016-10-04 ENCOUNTER — Encounter: Payer: Self-pay | Admitting: Internal Medicine

## 2016-10-04 VITALS — BP 132/72 | HR 83 | Temp 97.7°F | Resp 16 | Wt 229.0 lb

## 2016-10-04 DIAGNOSIS — F0781 Postconcussional syndrome: Secondary | ICD-10-CM | POA: Diagnosis not present

## 2016-10-04 DIAGNOSIS — M62830 Muscle spasm of back: Secondary | ICD-10-CM | POA: Diagnosis not present

## 2016-10-04 DIAGNOSIS — M542 Cervicalgia: Secondary | ICD-10-CM | POA: Diagnosis not present

## 2016-10-04 MED ORDER — CYCLOBENZAPRINE HCL 5 MG PO TABS: 5.0000 mg | ORAL_TABLET | Freq: Every day | ORAL | 0 refills | Status: DC

## 2016-10-04 NOTE — Progress Notes (Signed)
Pre visit review using our clinic review tool, if applicable. No additional management support is needed unless otherwise documented below in the visit note. 

## 2016-10-04 NOTE — Assessment & Plan Note (Signed)
Symptoms of confusion/focus/difficulty with memory are suggestive of concussion despite no headaches Will take her out of work for a few days - rest - avoid activities that increase symptoms Will let me know if there is no improvement or other concerning symptoms

## 2016-10-04 NOTE — Progress Notes (Signed)
Subjective:    Patient ID: Patricia Greene, female    DOB: Jul 03, 1956, 61 y.o.   MRN: [id number]  HPI She is here for follow up.  She fell christmas eve night.  She hit a corner of the wall on her right neck.  She has had a raspy voice since hitting herself.  The neck was bruising and still feels lumpy and tender.  She bruised her left knee.  She injured her right knee - it felt twisted and unstable.  she is having upper leg pain in the right .  She had a back spasms in her left lower back when she bends over or moves in bed.  She is taking baclofen 20 mg, which helps, but she is still having back spasms.  She has mid back pain to lower back pain.  She is not sleeping well - she has to wake up at night to help her mom and has intermittent back spasms.  She injured her right shoulder but it is getting better.  She has suffered cognitive changes since th fall and wonders if she had a concussion- she mixes her words up and she can not think of more than one thing at a time.  She has difficulty multitasking.  She has difficulty doing her routine activities - grocery shopping - forgetting, not thinking clearly.  She pulled into the wrong place yesterday - pulled into [employer] instead of going to  [facility] to pick up her mom's meds.  She denies headaches.    She has only two sick days left.  She is under a lot of stress with taking care of her mom.  She has stress at work as well.   Medications and allergies reviewed with patient and updated if appropriate.  Patient Active Problem List   Diagnosis Date Noted  . Cough 09/26/2016  . Abnormal MRI, breast 04/20/2016  . Breast pain, left 04/15/2016  . H/O breast implant 04/15/2016  . Polyarthralgia 10/18/2015  . Osteopenia 02/01/2015  . Allergic rhinitis, cause unspecified 03/11/2011  . Vitamin D deficiency 03/13/2010  . HYPERCHOLESTEROLEMIA, BORDERLINE 03/13/2010  . MIGRAINE HEADACHE 03/13/2010  . CAROTID BRUIT 03/13/2010  . Hypothyroidism 03/09/2010   . NECK PAIN 02/25/2008  . INSOMNIA, CHRONIC 02/20/2008  . ANXIETY 09/06/2007  . FIBROMYALGIA 09/06/2007  . IRRITABLE BOWEL SYNDROME, HX OF 09/06/2007    Current Outpatient Prescriptions on File Prior to Visit  Medication Sig Dispense Refill  . CALCIUM PO Take by mouth. 3 tabs in the morning 2 tabs at lunch     . Cholecalciferol (VITAMIN D PO) Take by mouth. 2000 units daily     . LORazepam (ATIVAN) 1 MG tablet TAKE ONE TABLET BY MOUTH TWICE DAILY AS NEEDED FOR ANXIETY 60 tablet 1  . Multiple Vitamins-Minerals (CENTRUM SILVER) tablet Take 1 tablet by mouth daily.      Marland Kitchen NATURE-THROID 81.25 MG TABS   4  . Vitamin D, Ergocalciferol, (DRISDOL) 50000 units CAPS capsule TAKE ONE CAPSULE BY MOUTH EVERY 7 DAYS 12 capsule 2   No current facility-administered medications on file prior to visit.     Past Medical History:  Diagnosis Date  . Allergic rhinitis   . Anxiety state, unspecified   . Carotid bruit   . Cervicalgia   . Diverticulosis   . Fibromyalgia   . History of migraine headaches   . Hypothyroidism    nodular goiter  . IBS (irritable bowel syndrome)   . Insomnia   . Obesity   .  Unspecified vitamin D deficiency     Past Surgical History:  Procedure Laterality Date  . BREAST ENHANCEMENT SURGERY    . COLONOSCOPY  1999, 2004 Sammuel Cooper), 08/09/2011   1999, 2004: diverticulosis  . THYROIDECTOMY  1973   right, toxic nodule    Social History   Social History  . Marital status: Divorced    Spouse name: N/A  . Number of children: 0  . Years of education: N/A   Occupational History  . RN    Social History Main Topics  . Smoking status: Never Smoker  . Smokeless tobacco: Never Used  . Alcohol use No     Comment: rare   . Drug use: No  . Sexual activity: Not on file   Other Topics Concern  . Not on file   Social History Narrative   2-3 caffeine drinks daily    Divorced, no children. 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 Asthma Mother   . Breast cancer Mother   . Allergic rhinitis Mother   . Heart disease Mother   . Colon cancer Father 60    died at 41  . Colon polyps Sister   . Esophageal cancer Neg Hx   . Stomach cancer Neg Hx     Review of Systems  Constitutional: Negative for fever.  Eyes: Negative for visual disturbance.  Respiratory: Negative for shortness of breath.   Cardiovascular: Negative for chest pain, palpitations and leg swelling.  Gastrointestinal: Negative for nausea.  Musculoskeletal: Positive for arthralgias, back pain and myalgias.  Neurological: Negative for dizziness, light-headedness and headaches.  Psychiatric/Behavioral: Positive for confusion and decreased concentration.       Objective:   Vitals:   10/04/16 1449  BP: 132/72  Pulse: 83  Resp: 16  Temp: 97.7 F (36.5 C)   Filed Weights   10/04/16 1449  Weight: 229 lb (103.9 kg)   Body mass index is 36.96 kg/m.  Wt Readings from Last 3 Encounters:  10/04/16 229 lb (103.9 kg)  09/26/16 232 lb 12 oz (105.6 kg)  09/01/16 229 lb (103.9 kg)     Physical Exam  Constitutional: She is oriented to person, place, and time. She appears well-developed and well-nourished. No distress.  HENT:  Head: Normocephalic.  Residual bruising in right neck - palpable residual hematoma  Eyes: Conjunctivae are normal.  Neck: Neck supple. No tracheal deviation present. No thyromegaly present.  Mild residual bruising right neck - slightly tender, palpable lumps under skin - hematoma  Cardiovascular: Normal rate, regular rhythm and normal heart sounds.   No murmur heard. Pulmonary/Chest: Effort normal and breath sounds normal. No respiratory distress. She has no wheezes. She has no rales.  Musculoskeletal: She exhibits no edema.  Mild back tenderness  - back spasms are mid to lower back; no spine tenderness  Lymphadenopathy:    She has no cervical adenopathy.    Neurological: She is alert and oriented to person, place, and time. No cranial nerve deficit.  Skin: Skin is warm and dry. She is not diaphoretic.  Psychiatric: She has a normal mood and affect. Her behavior is normal.        Assessment & Plan:    See Problem List for Assessment and Plan of chronic medical problems.   Will take her out of work until 1/8 - next Monday.  Will extend if needed.  Probable concussion.  Rest -  avoid activities that increase confusion or other symptoms.   Will start flexeril at night.  Her mom will need an aid at home to watch for her since she will not be able to do that.    Follow up if no improvement/worsening

## 2016-10-04 NOTE — Assessment & Plan Note (Signed)
Related to injury - hitting neck against a wall - improving slowly No further evaluation or treatment needed Reassurance given

## 2016-10-04 NOTE — Patient Instructions (Addendum)
Take flexeril at night for your back spasms.  This will make you drowsy.    Use heat, ice, advil as needed.   Stay out of work at least until 10/09/16, possibly longer to recover from your concussion.    You should not do anything that increases your concussion symptoms, including caring for your mother, talking on the phone, watching TV and working.  While recovering from the concussion I recommend that your mother have additional help to care for her - you will not be able to do this.      Post-Concussion Syndrome Introduction Post-concussion syndrome describes the symptoms that can occur after a head injury. These symptoms can last from weeks to months. What are the causes? It is not clear why some head injuries cause post-concussion syndrome. It can occur whether your head injury was mild or severe and whether you were wearing head protection or not. What are the signs or symptoms?  Memory difficulties.  Dizziness.  Headaches.  Double vision or blurry vision.  Sensitivity to light.  Hearing difficulties.  Depression.  Tiredness.  Weakness.  Difficulty with concentration.  Difficulty sleeping or staying asleep.  Vomiting.  Poor balance or instability on your feet.  Slow reaction time.  Difficulty learning and remembering things you have heard. How is this diagnosed? There is no test to determine whether you have post-concussion syndrome. Your health care provider may order an imaging scan of your brain, such as a CT scan, to check for other problems that may be causing your symptoms (such as a severe injury inside your skull). How is this treated? Usually, these problems disappear over time without medical care. Your health care provider may prescribe medicine to help ease your symptoms. It is important to follow up with a neurologist to evaluate your recovery and address any lingering symptoms or issues. Follow these instructions at home:  Take medicines only as  directed by your health care provider. Do not take aspirin. Aspirin can slow blood clotting.  Sleep with your head slightly elevated to help with headaches.  Avoid any situation where there is potential for another head injury. This includes football, hockey, soccer, basketball, martial arts, downhill snow sports, and horseback riding. Your condition will get worse every time you experience a concussion. You should avoid these activities until you are evaluated by the appropriate follow-up health care providers.  Keep all follow-up visits as directed by your health care provider. This is important. Contact a health care provider if:  You have increased problems paying attention or concentrating.  You have increased difficulty remembering or learning new information.  You need more time to complete tasks or assignments than before.  You have increased irritability or decreased ability to cope with stress.  You have more symptoms than before. Seek medical care if you have any of the following symptoms for more than two weeks after your injury:  Lasting (chronic) headaches.  Dizziness or balance problems.  Nausea.  Vision problems.  Increased sensitivity to noise or light.  Depression or mood swings.  Anxiety or irritability.  Memory problems.  Difficulty concentrating or paying attention.  Sleep problems.  Feeling tired all the time. Get help right away if:  You have confusion or unusual drowsiness.  Others find it difficult to wake you up.  You have nausea or persistent, forceful vomiting.  You feel like you are moving when you are not (vertigo). Your eyes may move rapidly back and forth.  You have convulsions or faint.  You have severe, persistent headaches that are not relieved by medicine.  You cannot use your arms or legs normally.  One of your pupils is larger than the other.  You have clear or bloody discharge from your nose or ears.  Your problems  are getting worse, not better. This information is not intended to replace advice given to you by your health care provider. Make sure you discuss any questions you have with your health care provider. Document Released: 03/10/2002 Document Revised: 04/07/2016 Document Reviewed: 12/24/2013  2017 [employer]

## 2016-10-04 NOTE — Assessment & Plan Note (Signed)
Baclofen helps, but not enough Will start flexeril 5-10 mg at night - she understands this will make her drowsy

## 2016-10-05 ENCOUNTER — Telehealth: Payer: Self-pay | Admitting: Internal Medicine

## 2016-10-05 NOTE — Telephone Encounter (Signed)
Pt states that she was given Flexeril and the instructions are for when she goes back to work. Since she is home in bed she is wanting to know how often she can take this. Please give her a call asap

## 2016-10-05 NOTE — Telephone Encounter (Signed)
Please advise 

## 2016-10-05 NOTE — Telephone Encounter (Signed)
Spoke with pt to inform.  

## 2016-10-05 NOTE — Telephone Encounter (Signed)
Ok to take flexeril 3 times a day

## 2016-10-06 ENCOUNTER — Encounter: Payer: Self-pay | Admitting: Emergency Medicine

## 2016-10-06 ENCOUNTER — Telehealth: Payer: Self-pay | Admitting: Internal Medicine

## 2016-10-06 NOTE — Telephone Encounter (Signed)
Spoke with pt, will fax work note to pt.

## 2016-10-06 NOTE — Telephone Encounter (Signed)
Pt called and said that she is going to stay out of work next week.  She is having some soreness in her head and sweating at night.  She would like to speak to nurse.  She needs a note for next week.

## 2016-10-06 NOTE — Telephone Encounter (Signed)
Are you okay with right pt out of work for a week?

## 2016-10-06 NOTE — Telephone Encounter (Signed)
yes

## 2016-10-12 ENCOUNTER — Telehealth: Payer: Self-pay | Admitting: Internal Medicine

## 2016-10-12 ENCOUNTER — Encounter: Payer: Self-pay | Admitting: Emergency Medicine

## 2016-10-12 NOTE — Telephone Encounter (Signed)
Letter has been printed and will be faxed to pts home fax number.

## 2016-10-12 NOTE — Telephone Encounter (Signed)
Please advise if you are okay with pt being out of work another week.

## 2016-10-12 NOTE — Telephone Encounter (Signed)
Yes ok to write

## 2016-10-12 NOTE — Telephone Encounter (Signed)
Patient is requesting call back.  Is requesting an additional work note to keep her out of work this week.

## 2016-10-17 ENCOUNTER — Telehealth: Payer: Self-pay | Admitting: Emergency Medicine

## 2016-10-17 DIAGNOSIS — Z0289 Encounter for other administrative examinations: Secondary | ICD-10-CM

## 2016-10-17 NOTE — Telephone Encounter (Signed)
Paperwork has been filled out by MD, Pt will come by office and pick-up.

## 2016-11-13 ENCOUNTER — Ambulatory Visit (INDEPENDENT_AMBULATORY_CARE_PROVIDER_SITE_OTHER): Payer: BC Managed Care – PPO | Admitting: Orthopaedic Surgery

## 2016-11-13 ENCOUNTER — Ambulatory Visit (INDEPENDENT_AMBULATORY_CARE_PROVIDER_SITE_OTHER): Payer: Self-pay

## 2016-11-13 DIAGNOSIS — M25562 Pain in left knee: Secondary | ICD-10-CM

## 2016-11-13 DIAGNOSIS — M25561 Pain in right knee: Secondary | ICD-10-CM | POA: Diagnosis not present

## 2016-11-13 NOTE — Progress Notes (Signed)
Office Visit Note   Patient: Patricia Greene           Date of Birth: 11/25/1955           MRN: [id number] Visit Date: 11/13/2016              Requested by: Binnie Rail, MD [place], [place] [address] PCP: Binnie Rail, MD   Assessment & Plan: Visit Diagnoses:  1. Acute pain of left knee   2. Acute pain of right knee     Plan: We will follow this just conservatively for now. We'll have her work on quad strengthening exercises in my next step would be to provide at least intra-articular steroid injection in the right knee if it still bothers her. We'll see her back in 4 weeks but if she is continue to improve she'll call and cancel appointment.  Follow-Up Instructions: Return in about 4 weeks (around 12/11/2016).   Orders:  Orders Placed This Encounter  Procedures  . XR Knee 1-2 Views Left  . XR Knee 1-2 Views Right   No orders of the defined types were placed in this encounter.     Procedures: No procedures performed   Clinical Data: No additional findings.   Subjective: No chief complaint on file. This is a patient comes in with bilateral knee pain with the right knee hurting worse in the left. This actually comes after a traumatic fall at home in this fall was so bad she actually sustained a concussion and significant contusions around her neck and shoulder with whiplash. Once she was getting over that she noted that her knees were hurting with bruising as well. The left knee was hurting anterior in the right knee on the medial joint line she says is been worse getting in and out of cars and with rotating types of activities and pivoting. She said the knee did not swelling now but a definite been painful since this accident. She had not had issues with her knees before. The working getting over her concussion as well as whiplash.  HPI  Review of Systems She currently denies any chest pain, shortness of breath, fever, chills, nausea,  vomiting.  Objective: Vital Signs: There were no vitals taken for this visit.  Physical Exam She is alert and oriented 3 and in no acute distress Ortho Exam Examination of both knees show that they're ligamentously stable. She has good range of motion of both knees per she hurts over the patella and the patella tendon on the left side of her extensor mechanism is intact. She hurts over the medial joint line on the right knee. When I rotate the tibia on the femur hurts significantly but there is no locking component of this. Her Lockman's are negative bilaterally. She does have some patellofemoral crepitation but her patellas track centrally and normally. Specialty Comments:  No specialty comments available.  Imaging: Xr Knee 1-2 Views Left  Result Date: 11/13/2016 An AP and lateral of the left knee show only mild arthritic changes. There is no acute deformities and no effusion. There is no evidence of fracture.  Xr Knee 1-2 Views Right  Result Date: 11/13/2016 2 views of her right knee show no acute findings. There is no effusion. There is no evidence of fracture. She has just some slight patellofemoral arthritic changes and some slight medial joint space narrowing    PMFS History: Patient Active Problem List   Diagnosis Date Noted  . Back muscle  spasm 10/04/2016  . Post concussion syndrome 10/04/2016  . Cough 09/26/2016  . Abnormal MRI, breast 04/20/2016  . Breast pain, left 04/15/2016  . H/O breast implant 04/15/2016  . Polyarthralgia 10/18/2015  . Osteopenia 02/01/2015  . Allergic rhinitis, cause unspecified 03/11/2011  . Vitamin D deficiency 03/13/2010  . HYPERCHOLESTEROLEMIA, BORDERLINE 03/13/2010  . MIGRAINE HEADACHE 03/13/2010  . CAROTID BRUIT 03/13/2010  . Hypothyroidism 03/09/2010  . NECK PAIN 02/25/2008  . INSOMNIA, CHRONIC 02/20/2008  . ANXIETY 09/06/2007  . FIBROMYALGIA 09/06/2007  . IRRITABLE BOWEL SYNDROME, HX OF 09/06/2007   Past Medical History:   Diagnosis Date  . Allergic rhinitis   . Anxiety state, unspecified   . Carotid bruit   . Cervicalgia   . Diverticulosis   . Fibromyalgia   . History of migraine headaches   . Hypothyroidism    nodular goiter  . IBS (irritable bowel syndrome)   . Insomnia   . Obesity   . Unspecified vitamin D deficiency     Family History  Problem Relation Age of Onset  . Asthma Mother   . Breast cancer Mother   . Allergic rhinitis Mother   . Heart disease Mother   . Colon cancer Father 45    died at 35  . Colon polyps Sister   . Esophageal cancer Neg Hx   . Stomach cancer Neg Hx     Past Surgical History:  Procedure Laterality Date  . BREAST ENHANCEMENT SURGERY    . COLONOSCOPY  1999, 2004 Sammuel Cooper), 08/09/2011   1999, 2004: diverticulosis  . THYROIDECTOMY  1973   right, toxic nodule   Social History   Occupational History  . RN    Social History Main Topics  . Smoking status: Never Smoker  . Smokeless tobacco: Never Used  . Alcohol use No     Comment: rare   . Drug use: No  . Sexual activity: Not on file

## 2016-11-30 ENCOUNTER — Encounter: Payer: Self-pay | Admitting: Internal Medicine

## 2016-12-07 ENCOUNTER — Ambulatory Visit (INDEPENDENT_AMBULATORY_CARE_PROVIDER_SITE_OTHER): Payer: BC Managed Care – PPO | Admitting: Orthopaedic Surgery

## 2016-12-08 ENCOUNTER — Telehealth: Payer: Self-pay | Admitting: Internal Medicine

## 2016-12-08 DIAGNOSIS — M8580 Other specified disorders of bone density and structure, unspecified site: Secondary | ICD-10-CM

## 2016-12-08 DIAGNOSIS — Z13820 Encounter for screening for osteoporosis: Secondary | ICD-10-CM

## 2016-12-08 NOTE — Telephone Encounter (Signed)
Patient had a fall and was discovered she had a compression fracture.  Patient was told she needs to have a bone density ordered in order to see if she needs to take a certain medication.  Patient wanted to know if Dr. Quay Burow could send order to [place] without seeing her since she had seen Dr. Quay Burow for fall?  Patient does have CPE scheduled for April.

## 2016-12-10 NOTE — Telephone Encounter (Signed)
dexa ordered.  Does not need to see me before her next appt

## 2016-12-11 NOTE — Telephone Encounter (Signed)
Spoke with pt to inform.  

## 2016-12-21 ENCOUNTER — Encounter: Payer: Self-pay | Admitting: Internal Medicine

## 2017-01-01 NOTE — Progress Notes (Signed)
Subjective:    Patient ID: Patricia Greene, female    DOB: 1956-09-24, 61 y.o.   MRN: [id number]  HPI She is here for a physical exam.   She is not sleeping well at night.  She has vivid dreams.  She does have a lot of stress, which she knows is contributing.   She feels hot all the time.   Her back still hurts - she did have a compression fracture and that has healed.  She still has back pain that is muscle pain.  She plans on starting PT.  She has seen an osteoporosis specialist and she recommended starting forteo.  She has not been taking her hight dose vitamin d regularly.  She plans on starting a bone vitamin.  She is not exercising regularly.    Medications and allergies reviewed with patient and updated if appropriate.  Patient Active Problem List   Diagnosis Date Noted  . Back muscle spasm 10/04/2016  . Post concussion syndrome 10/04/2016  . Cough 09/26/2016  . Abnormal MRI, breast 04/20/2016  . Breast pain, left 04/15/2016  . H/O breast implant 04/15/2016  . Polyarthralgia 10/18/2015  . Osteopenia 02/01/2015  . Allergic rhinitis, cause unspecified 03/11/2011  . Vitamin D deficiency 03/13/2010  . HYPERCHOLESTEROLEMIA, BORDERLINE 03/13/2010  . MIGRAINE HEADACHE 03/13/2010  . CAROTID BRUIT 03/13/2010  . Hypothyroidism 03/09/2010  . NECK PAIN 02/25/2008  . INSOMNIA, CHRONIC 02/20/2008  . ANXIETY 09/06/2007  . FIBROMYALGIA 09/06/2007  . IRRITABLE BOWEL SYNDROME, HX OF 09/06/2007    Current Outpatient Prescriptions on File Prior to Visit  Medication Sig Dispense Refill  . baclofen (LIORESAL) 20 MG tablet Take 20 mg by mouth 4 (four) times daily.    Marland Kitchen CALCIUM PO Take by mouth. 3 tabs in the morning 2 tabs at lunch     . Cholecalciferol (VITAMIN D PO) Take by mouth. 2000 units daily     . LORazepam (ATIVAN) 1 MG tablet TAKE ONE TABLET BY MOUTH TWICE DAILY AS NEEDED FOR ANXIETY 60 tablet 1  . Multiple Vitamins-Minerals (CENTRUM SILVER) tablet Take 1 tablet by mouth  daily.      Marland Kitchen NATURE-THROID 81.25 MG TABS 130 mg.   4  . Vitamin D, Ergocalciferol, (DRISDOL) 50000 units CAPS capsule TAKE ONE CAPSULE BY MOUTH EVERY 7 DAYS 12 capsule 2  . cyclobenzaprine (FLEXERIL) 5 MG tablet Take 1-2 tablets (5-10 mg total) by mouth at bedtime. (Patient not taking: Reported on 01/02/2017) 60 tablet 0   No current facility-administered medications on file prior to visit.     Past Medical History:  Diagnosis Date  . Allergic rhinitis   . Anxiety state, unspecified   . Carotid bruit   . Cervicalgia   . Diverticulosis   . Fibromyalgia   . History of migraine headaches   . Hypothyroidism    nodular goiter  . IBS (irritable bowel syndrome)   . Insomnia   . Obesity   . Unspecified vitamin D deficiency     Past Surgical History:  Procedure Laterality Date  . BREAST ENHANCEMENT SURGERY    . COLONOSCOPY  1999, 2004 Sammuel Cooper), 08/09/2011   1999, 2004: diverticulosis  . THYROIDECTOMY  1973   right, toxic nodule    Social History   Social History  . Marital status: Divorced    Spouse name: N/A  . Number of children: 0  . Years of education: N/A   Occupational History  . RN    Social History Main Topics  .  Smoking status: Never Smoker  . Smokeless tobacco: Never Used  . Alcohol use No     Comment: rare   . Drug use: No  . Sexual activity: Not on file   Other Topics Concern  . Not on file   Social History Narrative   2-3 caffeine drinks daily    Divorced, no children. 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 Asthma Mother   . Breast cancer Mother   . Allergic rhinitis Mother   . Heart disease Mother   . Colon cancer Father 39    died at 33  . Colon polyps Sister   . Esophageal cancer Neg Hx   . Stomach cancer Neg Hx     Review of Systems  Constitutional: Positive for fatigue. Negative for chills and fever.  HENT: Negative for sinus pain and  sore throat.   Eyes: Negative for visual disturbance.  Respiratory: Positive for cough (every morning - gets thick mucus up - no cough during day). Negative for shortness of breath and wheezing.   Cardiovascular: Positive for leg swelling (ankles and feet). Negative for chest pain and palpitations.  Gastrointestinal: Negative for abdominal pain, blood in stool, constipation, diarrhea and nausea.       No gerd  Genitourinary: Negative for dysuria and hematuria.  Musculoskeletal: Positive for back pain (muscle pain).  Skin: Negative for color change and rash.  Neurological: Negative for light-headedness and headaches.  Psychiatric/Behavioral: Positive for sleep disturbance. Negative for dysphoric mood. The patient is not nervous/anxious.        Short temper       Objective:   Vitals:   01/02/17 1004  BP: 112/70  Pulse: 80  Temp: 97.7 F (36.5 C)   Filed Weights   01/02/17 1004  Weight: 221 lb 1.9 oz (100.3 kg)   Body mass index is 35.69 kg/m.  Wt Readings from Last 3 Encounters:  01/02/17 221 lb 1.9 oz (100.3 kg)  10/04/16 229 lb (103.9 kg)  09/26/16 232 lb 12 oz (105.6 kg)     Physical Exam Constitutional: She appears well-developed and well-nourished. No distress.  HENT:  Head: Normocephalic and atraumatic.  Right Ear: External ear normal. Normal ear canal and TM Left Ear: External ear normal.  Normal ear canal and TM Mouth/Throat: Oropharynx is clear and moist.  Eyes: Conjunctivae and EOM are normal.  Neck: Neck supple. No tracheal deviation present. No thyromegaly present.  No carotid bruit  Cardiovascular: Normal rate, regular rhythm and normal heart sounds.   No murmur heard.  No edema. Pulmonary/Chest: Effort normal and breath sounds normal. No respiratory distress. She has no wheezes. She has no rales.  Breast: deferred to Gyn Abdominal: Soft. She exhibits no distension. There is no tenderness.  Lymphadenopathy: She has no cervical adenopathy.  Skin: Skin is  warm and dry. She is not diaphoretic.  Psychiatric: She has a normal mood and affect. Her behavior is normal.         Assessment & Plan:   Physical exam: Screening blood work  ordered Immunizations   Up to date  Colonoscopy  Due  - has appt with Dr Carlean Purl Mammogram  Up to date  Gyn  Up to date  Dexa  - up to date at [facility] exams  Up to date  EKG - last done 2014 Exercise  Not regularly - will start Weight - advised  weight loss Skin  - saw derm last week Substance abuse  none  See Problem List for Assessment and Plan of chronic medical problems.  FU annually

## 2017-01-02 ENCOUNTER — Ambulatory Visit (INDEPENDENT_AMBULATORY_CARE_PROVIDER_SITE_OTHER): Payer: BC Managed Care – PPO | Admitting: Internal Medicine

## 2017-01-02 ENCOUNTER — Encounter: Payer: Self-pay | Admitting: Internal Medicine

## 2017-01-02 ENCOUNTER — Other Ambulatory Visit (INDEPENDENT_AMBULATORY_CARE_PROVIDER_SITE_OTHER): Payer: BC Managed Care – PPO

## 2017-01-02 VITALS — BP 112/70 | HR 80 | Temp 97.7°F | Ht 66.0 in | Wt 221.1 lb

## 2017-01-02 DIAGNOSIS — G4700 Insomnia, unspecified: Secondary | ICD-10-CM

## 2017-01-02 DIAGNOSIS — Z0000 Encounter for general adult medical examination without abnormal findings: Secondary | ICD-10-CM | POA: Diagnosis not present

## 2017-01-02 DIAGNOSIS — E890 Postprocedural hypothyroidism: Secondary | ICD-10-CM | POA: Diagnosis not present

## 2017-01-02 DIAGNOSIS — M8580 Other specified disorders of bone density and structure, unspecified site: Secondary | ICD-10-CM

## 2017-01-02 DIAGNOSIS — Z0001 Encounter for general adult medical examination with abnormal findings: Secondary | ICD-10-CM | POA: Diagnosis not present

## 2017-01-02 LAB — COMPREHENSIVE METABOLIC PANEL
ALBUMIN: 4.3 g/dL (ref 3.5–5.2)
ALK PHOS: 63 U/L (ref 39–117)
ALT: 15 U/L (ref 0–35)
AST: 18 U/L (ref 0–37)
BUN: 9 mg/dL (ref 6–23)
CHLORIDE: 105 meq/L (ref 96–112)
CO2: 26 mEq/L (ref 19–32)
Calcium: 9.6 mg/dL (ref 8.4–10.5)
Creatinine, Ser: 0.74 mg/dL (ref 0.40–1.20)
GFR: 84.29 mL/min (ref >60.00)
Glucose, Bld: 105 mg/dL — ABNORMAL HIGH (ref 70–99)
POTASSIUM: 4 meq/L (ref 3.5–5.1)
Sodium: 139 mEq/L (ref 135–145)
TOTAL PROTEIN: 6.7 g/dL (ref 6.0–8.3)
Total Bilirubin: 0.6 mg/dL (ref 0.2–1.2)

## 2017-01-02 LAB — LIPID PANEL
CHOL/HDL RATIO: 3
Cholesterol: 168 mg/dL (ref 0–200)
HDL: 59 mg/dL (ref >39.00)
LDL CALC: 89 mg/dL (ref 0–99)
NonHDL: 108.61
Triglycerides: 100 mg/dL (ref 0.0–149.0)
VLDL: 20 mg/dL (ref 0.0–40.0)

## 2017-01-02 LAB — CBC WITH DIFFERENTIAL/PLATELET
BASOS PCT: 0.5 % (ref 0.0–3.0)
Basophils Absolute: 0 10*3/uL (ref 0.0–0.1)
EOS PCT: 2.1 % (ref 0.0–5.0)
Eosinophils Absolute: 0.1 10*3/uL (ref 0.0–0.7)
HCT: 39.3 % (ref 36.0–46.0)
HEMOGLOBIN: 12.9 g/dL (ref 12.0–15.0)
LYMPHS ABS: 2.3 10*3/uL (ref 0.7–4.0)
Lymphocytes Relative: 41 % (ref 12.0–46.0)
MCHC: 32.9 g/dL (ref 30.0–36.0)
MCV: 91.6 fl (ref 78.0–100.0)
MONO ABS: 0.4 10*3/uL (ref 0.1–1.0)
MONOS PCT: 7.8 % (ref 3.0–12.0)
Neutro Abs: 2.8 10*3/uL (ref 1.4–7.7)
Neutrophils Relative %: 48.6 % (ref 43.0–77.0)
Platelets: 251 10*3/uL (ref 150.0–400.0)
RBC: 4.29 Mil/uL (ref 3.87–5.11)
RDW: 13.4 % (ref 11.5–15.5)
WBC: 5.7 10*3/uL (ref 4.0–10.5)

## 2017-01-02 LAB — T3, FREE: T3, Free: 5.4 pg/mL — ABNORMAL HIGH (ref 2.3–4.2)

## 2017-01-02 LAB — HEMOGLOBIN A1C: Hgb A1c MFr Bld: 5.4 % (ref 4.6–6.5)

## 2017-01-02 LAB — TSH: TSH: 0.81 u[IU]/mL (ref 0.35–4.50)

## 2017-01-02 LAB — T4, FREE: FREE T4: 0.65 ng/dL (ref 0.60–1.60)

## 2017-01-02 LAB — VITAMIN D 25 HYDROXY (VIT D DEFICIENCY, FRACTURES): VITD: 33.33 ng/mL (ref 30.00–100.00)

## 2017-01-02 MED ORDER — TRAZODONE HCL 50 MG PO TABS: 25.0000 mg | ORAL_TABLET | Freq: Every evening | ORAL | 3 refills | Status: DC | PRN

## 2017-01-02 NOTE — Assessment & Plan Note (Signed)
Trial of trazodone - hopefully short term only Increase exercise

## 2017-01-02 NOTE — Assessment & Plan Note (Signed)
Check levels Has apt with integrative doctor - they will manage

## 2017-01-02 NOTE — Patient Instructions (Signed)
Test(s) ordered today. Your results will be released to [facility] (or called to you) after review, usually within 72hours after test completion. If any changes need to be made, you will be notified at that same time.  All other Health Maintenance issues reviewed.   All recommended immunizations and age-appropriate screenings are up-to-date or discussed.  No immunizations administered today.   Medications reviewed and updated.  Changes include trying trazodone for sleep.   Your prescription(s) have been submitted to your pharmacy. Please take as directed and contact our office if you believe you are having problem(s) with the medication(s).   Please followup in one year   Health Maintenance, Female Adopting a healthy lifestyle and getting preventive care can go a long way to promote health and wellness. Talk with your health care provider about what schedule of regular examinations is right for you. This is a good chance for you to check in with your provider about disease prevention and staying healthy. In between checkups, there are plenty of things you can do on your own. Experts have done a lot of research about which lifestyle changes and preventive measures are most likely to keep you healthy. Ask your health care provider for more information. Weight and diet Eat a healthy diet  Be sure to include plenty of vegetables, fruits, low-fat dairy products, and lean protein.  Do not eat a lot of foods high in solid fats, added sugars, or salt.  Get regular exercise. This is one of the most important things you can do for your health.  Most adults should exercise for at least 150 minutes each week. The exercise should increase your heart rate and make you sweat (moderate-intensity exercise).  Most adults should also do strengthening exercises at least twice a week. This is in addition to the moderate-intensity exercise. Maintain a healthy weight  Body mass index (BMI) is a measurement  that can be used to identify possible weight problems. It estimates body fat based on height and weight. Your health care provider can help determine your BMI and help you achieve or maintain a healthy weight.  For females 36 years of age and older:  A BMI below 18.5 is considered underweight.  A BMI of 18.5 to 24.9 is normal.  A BMI of 25 to 29.9 is considered overweight.  A BMI of 30 and above is considered obese. Watch levels of cholesterol and blood lipids  You should start having your blood tested for lipids and cholesterol at 63 years of age, then have this test every 5 years.  You may need to have your cholesterol levels checked more often if:  Your lipid or cholesterol levels are high.  You are older than 63 years of age.  You are at high risk for heart disease. Cancer screening Lung Cancer  Lung cancer screening is recommended for adults 55-33 years old who are at high risk for lung cancer because of a history of smoking.  A yearly low-dose CT scan of the lungs is recommended for people who:  Currently smoke.  Have quit within the past 15 years.  Have at least a 30-pack-year history of smoking. A pack year is smoking an average of one pack of cigarettes a day for 1 year.  Yearly screening should continue until it has been 15 years since you quit.  Yearly screening should stop if you develop a health problem that would prevent you from having lung cancer treatment. Breast Cancer  Practice breast self-awareness. This means understanding  how your breasts normally appear and feel.  It also means doing regular breast self-exams. Let your health care provider know about any changes, no matter how small.  If you are in your 20s or 30s, you should have a clinical breast exam (CBE) by a health care provider every 1-3 years as part of a regular health exam.  If you are 29 or older, have a CBE every year. Also consider having a breast X-ray (mammogram) every year.  If  you have a family history of breast cancer, talk to your health care provider about genetic screening.  If you are at high risk for breast cancer, talk to your health care provider about having an MRI and a mammogram every year.  Breast cancer gene (BRCA) assessment is recommended for women who have family members with BRCA-related cancers. BRCA-related cancers include:  Breast.  Ovarian.  Tubal.  Peritoneal cancers.  Results of the assessment will determine the need for genetic counseling and BRCA1 and BRCA2 testing. Cervical Cancer  Your health care provider may recommend that you be screened regularly for cancer of the pelvic organs (ovaries, uterus, and vagina). This screening involves a pelvic examination, including checking for microscopic changes to the surface of your cervix (Pap test). You may be encouraged to have this screening done every 3 years, beginning at age 69.  For women ages 50-65, health care providers may recommend pelvic exams and Pap testing every 3 years, or they may recommend the Pap and pelvic exam, combined with testing for human papilloma virus (HPV), every 5 years. Some types of HPV increase your risk of cervical cancer. Testing for HPV may also be done on women of any age with unclear Pap test results.  Other health care providers may not recommend any screening for nonpregnant women who are considered low risk for pelvic cancer and who do not have symptoms. Ask your health care provider if a screening pelvic exam is right for you.  If you have had past treatment for cervical cancer or a condition that could lead to cancer, you need Pap tests and screening for cancer for at least 20 years after your treatment. If Pap tests have been discontinued, your risk factors (such as having a new sexual partner) need to be reassessed to determine if screening should resume. Some women have medical problems that increase the chance of getting cervical cancer. In these cases,  your health care provider may recommend more frequent screening and Pap tests. Colorectal Cancer  This type of cancer can be detected and often prevented.  Routine colorectal cancer screening usually begins at 63 years of age and continues through 63 years of age.  Your health care provider may recommend screening at an earlier age if you have risk factors for colon cancer.  Your health care provider may also recommend using home test kits to check for hidden blood in the stool.  A small camera at the end of a tube can be used to examine your colon directly (sigmoidoscopy or colonoscopy). This is done to check for the earliest forms of colorectal cancer.  Routine screening usually begins at age 15.  Direct examination of the colon should be repeated every 5-10 years through 63 years of age. However, you may need to be screened more often if early forms of precancerous polyps or small growths are found. Skin Cancer  Check your skin from head to toe regularly.  Tell your health care provider about any new moles or changes in  moles, especially if there is a change in a mole's shape or color.  Also tell your health care provider if you have a mole that is larger than the size of a pencil eraser.  Always use sunscreen. Apply sunscreen liberally and repeatedly throughout the day.  Protect yourself by wearing long sleeves, pants, a wide-brimmed hat, and sunglasses whenever you are outside. Heart disease, diabetes, and high blood pressure  High blood pressure causes heart disease and increases the risk of stroke. High blood pressure is more likely to develop in:  People who have blood pressure in the high end of the normal range (130-139/85-89 mm Hg).  People who are overweight or obese.  People who are African American.  If you are 11-2 years of age, have your blood pressure checked every 3-5 years. If you are 64 years of age or older, have your blood pressure checked every year. You  should have your blood pressure measured twice-once when you are at a hospital or clinic, and once when you are not at a hospital or clinic. Record the average of the two measurements. To check your blood pressure when you are not at a hospital or clinic, you can use:  An automated blood pressure machine at a pharmacy.  A home blood pressure monitor.  If you are between 12 years and 41 years old, ask your health care provider if you should take aspirin to prevent strokes.  Have regular diabetes screenings. This involves taking a blood sample to check your fasting blood sugar level.  If you are at a normal weight and have a low risk for diabetes, have this test once every three years after 63 years of age.  If you are overweight and have a high risk for diabetes, consider being tested at a younger age or more often. Preventing infection Hepatitis B  If you have a higher risk for hepatitis B, you should be screened for this virus. You are considered at high risk for hepatitis B if:  You were born in a country where hepatitis B is common. Ask your health care provider which countries are considered high risk.  Your parents were born in a high-risk country, and you have not been immunized against hepatitis B (hepatitis B vaccine).  You have HIV or AIDS.  You use needles to inject street drugs.  You live with someone who has hepatitis B.  You have had sex with someone who has hepatitis B.  You get hemodialysis treatment.  You take certain medicines for conditions, including cancer, organ transplantation, and autoimmune conditions. Hepatitis C  Blood testing is recommended for:  Everyone born from [birth year] through 1967.  Anyone with known risk factors for hepatitis C. Sexually transmitted infections (STIs)  You should be screened for sexually transmitted infections (STIs) including gonorrhea and chlamydia if:  You are sexually active and are younger than 63 years of age.  You are  older than 63 years of age and your health care provider tells you that you are at risk for this type of infection.  Your sexual activity has changed since you were last screened and you are at an increased risk for chlamydia or gonorrhea. Ask your health care provider if you are at risk.  If you do not have HIV, but are at risk, it may be recommended that you take a prescription medicine daily to prevent HIV infection. This is called pre-exposure prophylaxis (PrEP). You are considered at risk if:  You are sexually active and  do not regularly use condoms or know the HIV status of your partner(s).  You take drugs by injection.  You are sexually active with a partner who has HIV. Talk with your health care provider about whether you are at high risk of being infected with HIV. If you choose to begin PrEP, you should first be tested for HIV. You should then be tested every 3 months for as long as you are taking PrEP. Pregnancy  If you are premenopausal and you may become pregnant, ask your health care provider about preconception counseling.  If you may become pregnant, take 400 to 800 micrograms (mcg) of folic acid every day.  If you want to prevent pregnancy, talk to your health care provider about birth control (contraception). Osteoporosis and menopause  Osteoporosis is a disease in which the bones lose minerals and strength with aging. This can result in serious bone fractures. Your risk for osteoporosis can be identified using a bone density scan.  If you are 75 years of age or older, or if you are at risk for osteoporosis and fractures, ask your health care provider if you should be screened.  Ask your health care provider whether you should take a calcium or vitamin D supplement to lower your risk for osteoporosis.  Menopause may have certain physical symptoms and risks.  Hormone replacement therapy may reduce some of these symptoms and risks. Talk to your health care provider  about whether hormone replacement therapy is right for you. Follow these instructions at home:  Schedule regular health, dental, and eye exams.  Stay current with your immunizations.  Do not use any tobacco products including cigarettes, chewing tobacco, or electronic cigarettes.  If you are pregnant, do not drink alcohol.  If you are breastfeeding, limit how much and how often you drink alcohol.  Limit alcohol intake to no more than 1 drink per day for nonpregnant women. One drink equals 12 ounces of beer, 5 ounces of wine, or 1 ounces of hard liquor.  Do not use street drugs.  Do not share needles.  Ask your health care provider for help if you need support or information about quitting drugs.  Tell your health care provider if you often feel depressed.  Tell your health care provider if you have ever been abused or do not feel safe at home. This information is not intended to replace advice given to you by your health care provider. Make sure you discuss any questions you have with your health care provider. Document Released: 04/03/2011 Document Revised: 02/24/2016 Document Reviewed: 06/22/2015 Elsevier Interactive Patient Education  2017 [employer].

## 2017-01-02 NOTE — Progress Notes (Signed)
Pre visit review using our clinic review tool, if applicable. No additional management support is needed unless otherwise documented below in the visit note. 

## 2017-01-02 NOTE — Assessment & Plan Note (Addendum)
dexa up to date  Specialist at [facility] recommended forteo - she would rather start the bone supplement, increase exercise and recheck in one year

## 2017-01-04 ENCOUNTER — Encounter: Payer: Self-pay | Admitting: Internal Medicine

## 2017-01-11 ENCOUNTER — Ambulatory Visit: Payer: BC Managed Care – PPO | Admitting: Internal Medicine

## 2017-02-04 ENCOUNTER — Other Ambulatory Visit: Payer: Self-pay | Admitting: Internal Medicine

## 2017-02-05 NOTE — Telephone Encounter (Signed)
RX faxed to [facility] 

## 2017-03-05 ENCOUNTER — Encounter: Payer: Self-pay | Admitting: Internal Medicine

## 2017-03-05 ENCOUNTER — Ambulatory Visit (INDEPENDENT_AMBULATORY_CARE_PROVIDER_SITE_OTHER): Payer: BC Managed Care – PPO | Admitting: Internal Medicine

## 2017-03-05 VITALS — BP 104/64 | HR 75 | Ht 66.0 in | Wt 224.0 lb

## 2017-03-05 DIAGNOSIS — R1310 Dysphagia, unspecified: Secondary | ICD-10-CM | POA: Diagnosis not present

## 2017-03-05 DIAGNOSIS — R1319 Other dysphagia: Secondary | ICD-10-CM

## 2017-03-05 DIAGNOSIS — Z800 Family history of malignant neoplasm of digestive organs: Secondary | ICD-10-CM | POA: Diagnosis not present

## 2017-03-05 NOTE — Progress Notes (Signed)
   Patricia Greene 61 y.o. Jul 07, 1956 [id number]  Assessment & Plan:   Encounter Diagnoses  Name Primary?  . Esophageal dysphagia Yes  . Family history of colon cancer     Evaluate and treat dysphagia w/ EGD/dilation Colonoscopy  The risks and benefits as well as alternatives of endoscopic procedure(s) have been discussed and reviewed. All questions answered. The patient agrees to proceed.    Subjective:   Chief Complaint:  Dysphagia, colon cancer screening  HPI She has intermittent solid dysphagia w/ suprasternal sticking point. Not much heartburn. No liquid dysphagia. Sxs x months. Stress may contribute. No odynophgia or thrush. Not on a PPI. Last colonoscopy 2012 - FHx CRCA  No unintentional wgt loss, bowel habit changes  Medications, allergies, past medical history, past surgical history, family history and social history are reviewed and updated in the EMR.   Review of Systems To have leaky breast implants removed this month. Stress with mom and her Alzheimer's  Objective:   Physical Exam @BP  104/64   Pulse 75   Ht 5\' 6"  (1.676 m)   Wt 224 lb (101.6 kg)   SpO2 98%   BMI 36.15 kg/m @  General:  Well-developed, well-nourished and in no acute distress Eyes:  anicteric. Lungs: Clear to auscultation bilaterally. Heart:  S1S2, no rubs, murmurs, gallops. Abdomen:  soft, non-tender, no hepatosplenomegaly, hernia, or mass and BS+.  Rectal: Deferred until colonoscopy Extremities:   no edema, cyanosis or clubbing Neuro:  A&O x 3.  Psych:  appropriate mood and  Affect.   Data Reviewed:   Prior colonoscopies last 2012 normal

## 2017-03-05 NOTE — Patient Instructions (Signed)
  You have been scheduled for an endoscopy and colonoscopy. Please follow the written instructions given to you at your visit today. Please pick up your prep supplies at the pharmacy. If you use inhalers (even only as needed), please bring them with you on the day of your procedure. Your physician has requested that you go to www.[email or web address].[email or web address] and enter the access code given to you at your visit today. This web site gives a general overview about your procedure. However, you should still follow specific instructions given to you by our office regarding your preparation for the procedure.    I appreciate the opportunity to care for you. Carl Gessner, MD, FACG 

## 2017-03-06 ENCOUNTER — Ambulatory Visit (INDEPENDENT_AMBULATORY_CARE_PROVIDER_SITE_OTHER): Payer: BC Managed Care – PPO | Admitting: Internal Medicine

## 2017-03-06 ENCOUNTER — Encounter: Payer: Self-pay | Admitting: Internal Medicine

## 2017-03-06 VITALS — BP 102/68 | HR 84 | Temp 97.6°F | Resp 16 | Wt 224.0 lb

## 2017-03-06 DIAGNOSIS — J3489 Other specified disorders of nose and nasal sinuses: Secondary | ICD-10-CM | POA: Diagnosis not present

## 2017-03-06 DIAGNOSIS — J0100 Acute maxillary sinusitis, unspecified: Secondary | ICD-10-CM | POA: Diagnosis not present

## 2017-03-06 MED ORDER — PREDNISONE 10 MG PO TABS: ORAL_TABLET | ORAL | 0 refills | Status: DC

## 2017-03-06 NOTE — Assessment & Plan Note (Signed)
Her infection is partially treated - today is her last zpak pill - so explained it is still in her system and does not need further antibiotics - she agrees Continue otc cold medications Will start prednisone to help with inflammation and sinus pain

## 2017-03-06 NOTE — Patient Instructions (Addendum)
Take the steroids as prescribed.  Continue mucinex-dm and over the counter cold medications.   Call if no improvement

## 2017-03-06 NOTE — Progress Notes (Signed)
Subjective:    Patient ID: Patricia Greene, female    DOB: 11/13/1955, 61 y.o.   MRN: [id number]  HPI She is here for an acute visit for cold symptoms.   Her symptoms started about one month ago with throat burning - it was not sore.  [facility] day weekend she was sick.  She went to urgent care that weekend.  She was diagnosed with a sinus infection and they started her on mucinex and augmentin.   That night she was very congestion.  There was no change in her symptoms.  She called them several days later and was changed to a zpak.  Today is her last day of the zpak.  All of her mucux/sputum changed from being discolored to being clear.  she still does not feel well.  Her sinuses are full and clogged.  She is coughing from deep in her chest. She has wheezing, chest tightness and mild SOB.  She is fatigued.  Her ears and teeth hurt.     She has completed both the augmentin and zpak (today is the last day). She has taken a nasal spray.    Medications and allergies reviewed with patient and updated if appropriate.  Patient Active Problem List   Diagnosis Date Noted  . Back muscle spasm 10/04/2016  . Cough 09/26/2016  . Abnormal MRI, breast 04/20/2016  . Breast pain, left 04/15/2016  . H/O breast implant 04/15/2016  . Polyarthralgia 10/18/2015  . Osteopenia 02/01/2015  . Allergic rhinitis, cause unspecified 03/11/2011  . Vitamin D deficiency 03/13/2010  . HYPERCHOLESTEROLEMIA, BORDERLINE 03/13/2010  . MIGRAINE HEADACHE 03/13/2010  . CAROTID BRUIT 03/13/2010  . Hypothyroidism 03/09/2010  . INSOMNIA, CHRONIC 02/20/2008  . ANXIETY 09/06/2007  . FIBROMYALGIA 09/06/2007  . IRRITABLE BOWEL SYNDROME, HX OF 09/06/2007    Current Outpatient Prescriptions on File Prior to Visit  Medication Sig Dispense Refill  . baclofen (LIORESAL) 20 MG tablet Take 20 mg by mouth 4 (four) times daily.    Marland Kitchen CALCIUM PO Take by mouth. 3 tabs in the morning 2 tabs at lunch     . Cholecalciferol (VITAMIN D  PO) Take by mouth. 2000 units daily     . LORazepam (ATIVAN) 1 MG tablet TAKE ONE TABLET BY MOUTH TWICE DAILY AS NEEDED FOR ANXIETY 60 tablet 1  . Multiple Vitamins-Minerals (CENTRUM SILVER) tablet Take 1 tablet by mouth daily.      Marland Kitchen NATURE-THROID 81.25 MG TABS 130 mg.   4  . traZODone (DESYREL) 50 MG tablet Take 0.5-1 tablets (25-50 mg total) by mouth at bedtime as needed for sleep. 30 tablet 3  . Vitamin D, Ergocalciferol, (DRISDOL) 50000 units CAPS capsule TAKE ONE CAPSULE BY MOUTH EVERY 7 DAYS 12 capsule 2   No current facility-administered medications on file prior to visit.     Past Medical History:  Diagnosis Date  . Allergic rhinitis   . Anxiety state, unspecified   . Arthritis    left thumb  . Carotid bruit   . Cervicalgia   . Diverticulosis   . Fibromyalgia   . History of migraine headaches   . Hypothyroidism    nodular goiter  . IBS (irritable bowel syndrome)   . Insomnia   . Obesity   . Unspecified vitamin D deficiency     Past Surgical History:  Procedure Laterality Date  . BREAST ENHANCEMENT SURGERY    . COLONOSCOPY  1999, 2004 Sammuel Cooper), 08/09/2011   1999, 2004: diverticulosis  . THYROIDECTOMY  1973   right, toxic nodule    Social History   Social History  . Marital status: Divorced    Spouse name: N/A  . Number of children: 0  . Years of education: N/A   Occupational History  . RN    Social History Main Topics  . Smoking status: Never Smoker  . Smokeless tobacco: Never Used  . Alcohol use No     Comment: rare   . Drug use: No  . Sexual activity: Not on file   Other Topics Concern  . Not on file   Social History Narrative   2-3 caffeine drinks daily    Divorced, no children. 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 Asthma Mother   . Breast cancer Mother   . Allergic rhinitis Mother   . Heart disease Mother   . Colon cancer Father 64        died at 67  . Colon polyps Sister   . Esophageal cancer Neg Hx   . Stomach cancer Neg Hx     Review of Systems  Constitutional: Negative for chills and fever.  HENT: Positive for congestion, ear pain (R > L), postnasal drip and sinus pain. Negative for sore throat.   Respiratory: Positive for cough (clear sputum), chest tightness, shortness of breath and wheezing (afternoon and evening).   Neurological: Positive for headaches (intermittent). Negative for dizziness and light-headedness.       Objective:   Vitals:   03/06/17 1107  BP: 102/68  Pulse: 84  Resp: 16  Temp: 97.6 F (36.4 C)   Filed Weights   03/06/17 1107  Weight: 224 lb (101.6 kg)   Body mass index is 36.15 kg/m.  Wt Readings from Last 3 Encounters:  03/06/17 224 lb (101.6 kg)  03/05/17 224 lb (101.6 kg)  01/02/17 221 lb 1.9 oz (100.3 kg)     Physical Exam GENERAL APPEARANCE: Appears stated age, mildly ill appearing, NAD EYES: conjunctiva clear, no icterus HEENT: bilateral tympanic membranes and ear canals normal, oropharynx with mild erythema, no thyromegaly, trachea midline, no cervical or supraclavicular lymphadenopathy LUNGS: Clear to auscultation without wheeze or crackles, unlabored breathing, good air entry bilaterally HEART: Normal S1,S2 without murmurs EXTREMITIES: Without clubbing, cyanosis, or edema        Assessment & Plan:   See Problem List for Assessment and Plan of chronic medical problems.

## 2017-03-06 NOTE — Assessment & Plan Note (Signed)
Related to sinus infection, congestion, sinuses being clogged Will start prednisone taper Restart mucinex - dm Can retry nasal spray  Call if no improvement

## 2017-03-09 ENCOUNTER — Encounter: Payer: Self-pay | Admitting: Internal Medicine

## 2017-03-14 ENCOUNTER — Encounter: Payer: Self-pay | Admitting: Internal Medicine

## 2017-03-14 ENCOUNTER — Ambulatory Visit (INDEPENDENT_AMBULATORY_CARE_PROVIDER_SITE_OTHER): Payer: BC Managed Care – PPO | Admitting: Internal Medicine

## 2017-03-14 ENCOUNTER — Ambulatory Visit (INDEPENDENT_AMBULATORY_CARE_PROVIDER_SITE_OTHER)
Admission: RE | Admit: 2017-03-14 | Discharge: 2017-03-14 | Disposition: A | Discharge status: Home / Self Care | Payer: BC Managed Care – PPO | Source: Ambulatory Visit | Attending: Internal Medicine | Admitting: Internal Medicine

## 2017-03-14 VITALS — BP 118/76 | HR 96 | Temp 98.9°F | Resp 16 | Wt 222.0 lb

## 2017-03-14 DIAGNOSIS — R0789 Other chest pain: Secondary | ICD-10-CM | POA: Diagnosis not present

## 2017-03-14 DIAGNOSIS — J45909 Unspecified asthma, uncomplicated: Secondary | ICD-10-CM | POA: Insufficient documentation

## 2017-03-14 DIAGNOSIS — J4541 Moderate persistent asthma with (acute) exacerbation: Secondary | ICD-10-CM

## 2017-03-14 DIAGNOSIS — J209 Acute bronchitis, unspecified: Secondary | ICD-10-CM | POA: Diagnosis not present

## 2017-03-14 DIAGNOSIS — R062 Wheezing: Secondary | ICD-10-CM | POA: Diagnosis not present

## 2017-03-14 MED ORDER — LEVOFLOXACIN 500 MG PO TABS: 500.0000 mg | ORAL_TABLET | Freq: Every day | ORAL | 0 refills | Status: DC

## 2017-03-14 MED ORDER — VITAMIN D (ERGOCALCIFEROL) 1.25 MG (50000 UNIT) PO CAPS: ORAL_CAPSULE | ORAL | 2 refills | Status: DC

## 2017-03-14 MED ORDER — METHYLPREDNISOLONE ACETATE 80 MG/ML IJ SUSP
80.0000 mg | Freq: Once | INTRAMUSCULAR | Status: AC
  Administered 2017-03-14: 80 mg via INTRAMUSCULAR

## 2017-03-14 MED ORDER — HYDROCOD POLST-CPM POLST ER 10-8 MG/5ML PO SUER: 5.0000 mL | Freq: Two times a day (BID) | ORAL | 0 refills | Status: DC | PRN

## 2017-03-14 MED ORDER — ALBUTEROL SULFATE HFA 108 (90 BASE) MCG/ACT IN AERS: 2.0000 | INHALATION_SPRAY | Freq: Four times a day (QID) | RESPIRATORY_TRACT | 0 refills | Status: DC | PRN

## 2017-03-14 NOTE — Addendum Note (Signed)
Addended by: Terence Lux B on: 03/14/2017 01:52 PM   Modules accepted: Orders

## 2017-03-14 NOTE — Assessment & Plan Note (Signed)
Secondary to bronchitis Start levaquin, depo-medrol 80 mg IM x 1, tussionex, albuterol inhaler prn Continue otc cold meds cxr today to r/o PNA

## 2017-03-14 NOTE — Patient Instructions (Signed)
Have a chest xray today  You received a steroid injection today  Take the antibiotic as prescribed.  Use the albuterol  Inhaler every 4-6 hours as needed.  Take the cough syrup as needed.    Call if no improvement

## 2017-03-14 NOTE — Progress Notes (Signed)
Subjective:    Patient ID: Patricia Greene, female    DOB: [birth year], 61 y.o.   MRN: [id number]  HPI She is here for an acute visit for cold symptoms.   Her symptoms started 5 weeks ago. She was treated for a sinus infection and I had prescribed prednisone after she was not getting better after the antibiotic. Some symptoms have improved, others have worsened.    She is now experiencing fatigue, cough that is tight and congested and she is not able to bring anything up, she is vomiting related to coughing fits, chest tightness, wheezing, low grade fever/chills, lightheadedness.   She denies sob, headaches, ear pain, sore throat.   She has tried taking mucinex- dm, mucinex nasal spray with some improvement in symptoms.  She is on the tail end of the prednisone taper - she has a few days left.    Medications and allergies reviewed with patient and updated if appropriate.  Patient Active Problem List   Diagnosis Date Noted  . Chest tightness 03/14/2017  . Sinus pain 03/06/2017  . Subacute maxillary sinusitis 03/06/2017  . Back muscle spasm 10/04/2016  . Cough 09/26/2016  . Abnormal MRI, breast 04/20/2016  . Breast pain, left 04/15/2016  . H/O breast implant 04/15/2016  . Polyarthralgia 10/18/2015  . Osteopenia 02/01/2015  . Allergic rhinitis, cause unspecified 03/11/2011  . Vitamin D deficiency 03/13/2010  . HYPERCHOLESTEROLEMIA, BORDERLINE 03/13/2010  . MIGRAINE HEADACHE 03/13/2010  . CAROTID BRUIT 03/13/2010  . Hypothyroidism 03/09/2010  . INSOMNIA, CHRONIC 02/20/2008  . ANXIETY 09/06/2007  . FIBROMYALGIA 09/06/2007  . IRRITABLE BOWEL SYNDROME, HX OF 09/06/2007    Current Outpatient Prescriptions on File Prior to Visit  Medication Sig Dispense Refill  . baclofen (LIORESAL) 20 MG tablet Take 20 mg by mouth 4 (four) times daily as needed.     Marland Kitchen CALCIUM PO Take by mouth. 3 tabs in the morning 2 tabs at lunch     . Cholecalciferol (VITAMIN D PO) Take by mouth. 2000  units daily     . LORazepam (ATIVAN) 1 MG tablet TAKE ONE TABLET BY MOUTH TWICE DAILY AS NEEDED FOR ANXIETY 60 tablet 1  . Multiple Vitamins-Minerals (CENTRUM SILVER) tablet Take 1 tablet by mouth daily.      Marland Kitchen NATURE-THROID 81.25 MG TABS 130 mg.   4  . predniSONE (DELTASONE) 10 MG tablet Take 4 tabs po qd x 3 days, then 3 tabs po qd x 3 days, then 2 tabs po qd x 3 days, then 1 tab po qd x 3 days 30 tablet 0  . traZODone (DESYREL) 50 MG tablet Take 0.5-1 tablets (25-50 mg total) by mouth at bedtime as needed for sleep. 30 tablet 3   No current facility-administered medications on file prior to visit.     Past Medical History:  Diagnosis Date  . Allergic rhinitis   . Anxiety state, unspecified   . Arthritis    left thumb  . Carotid bruit   . Cervicalgia   . Diverticulosis   . Fibromyalgia   . History of migraine headaches   . Hypothyroidism    nodular goiter  . IBS (irritable bowel syndrome)   . Insomnia   . Obesity   . Unspecified vitamin D deficiency     Past Surgical History:  Procedure Laterality Date  . BREAST ENHANCEMENT SURGERY    . COLONOSCOPY  1999, 2004 Sammuel Cooper), 08/09/2011   1999, 2004: diverticulosis  . THYROIDECTOMY  1973   right, toxic  nodule    Social History   Social History  . Marital status: Divorced    Spouse name: N/A  . Number of children: 0  . Years of education: N/A   Occupational History  . RN    Social History Main Topics  . Smoking status: Never Smoker  . Smokeless tobacco: Never Used  . Alcohol use No     Comment: rare   . Drug use: No  . Sexual activity: Not on file   Other Topics Concern  . Not on file   Social History Narrative   2-3 caffeine drinks daily    Divorced, no children. 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 Asthma Mother   . Breast cancer Mother   . Allergic rhinitis Mother   . Heart disease Mother   . Colon  cancer Father 4       died at 7  . Colon polyps Sister   . Esophageal cancer Neg Hx   . Stomach cancer Neg Hx     Review of Systems  Constitutional: Positive for chills and fever (low grade). Negative for appetite change.  HENT: Positive for congestion. Negative for ear pain (clogged), sinus pain, sinus pressure and sore throat.   Respiratory: Positive for cough, chest tightness and wheezing. Negative for shortness of breath.   Cardiovascular: Negative for chest pain.  Neurological: Positive for dizziness and light-headedness. Negative for headaches.       Objective:   Vitals:   03/14/17 1304  BP: 118/76  Pulse: 96  Resp: 16  Temp: 98.9 F (37.2 C)   Filed Weights   03/14/17 1304  Weight: 222 lb (100.7 kg)   Body mass index is 35.83 kg/m.  Wt Readings from Last 3 Encounters:  03/14/17 222 lb (100.7 kg)  03/06/17 224 lb (101.6 kg)  03/05/17 224 lb (101.6 kg)     Physical Exam GENERAL APPEARANCE: Appears stated age, well appearing, NAD EYES: conjunctiva clear, no icterus HEENT: bilateral tympanic membranes and ear canals normal, oropharynx with no erythema, no thyromegaly, trachea midline, no cervical or supraclavicular lymphadenopathy LUNGS: unlabored breathing, good air entry bilaterally, diffuse expiratory wheeze and coarse BS, no crackles HEART: Normal S1,S2 without murmurs EXTREMITIES: Without clubbing, cyanosis, or edema        Assessment & Plan:   See Problem List for Assessment and Plan of chronic medical problems.

## 2017-03-14 NOTE — Assessment & Plan Note (Addendum)
EKG done - NSR at 69 bpm, normal EKG, no change from prior from 2014 Likely related to bronchitis Treatment as above

## 2017-03-14 NOTE — Assessment & Plan Note (Addendum)
Originally a sinus infection - turned into bronchitis Start levaquin, depo-medrol 80 mg IM x 1, tussionex, albuterol inhaler prn Continue otc cold meds cxr today to r/o PNA

## 2017-03-15 ENCOUNTER — Encounter: Payer: Self-pay | Admitting: Internal Medicine

## 2017-03-15 ENCOUNTER — Telehealth: Payer: Self-pay | Admitting: Internal Medicine

## 2017-03-15 DIAGNOSIS — S22080A Wedge compression fracture of T11-T12 vertebra, initial encounter for closed fracture: Secondary | ICD-10-CM | POA: Insufficient documentation

## 2017-03-15 MED ORDER — ALBUTEROL SULFATE HFA 108 (90 BASE) MCG/ACT IN AERS: 2.0000 | INHALATION_SPRAY | Freq: Four times a day (QID) | RESPIRATORY_TRACT | 0 refills | Status: DC | PRN

## 2017-03-15 MED ORDER — HYDROCOD POLST-CPM POLST ER 10-8 MG/5ML PO SUER: 5.0000 mL | Freq: Two times a day (BID) | ORAL | 0 refills | Status: DC | PRN

## 2017-03-15 NOTE — Telephone Encounter (Signed)
Spoke with pharmacy to clarify RXs, Pt notified.

## 2017-03-15 NOTE — Telephone Encounter (Signed)
Pt called stating that [facility] in [facility] is needing some more information  regarding the cough medicine that was sent in yesterday.  She was also checking to see if Dr Quay Burow was going to send in the nebulizer for her? She was under the impression that she was but if she would rather not send it then that is fine, she just wanted to clarify.

## 2017-04-06 ENCOUNTER — Ambulatory Visit (INDEPENDENT_AMBULATORY_CARE_PROVIDER_SITE_OTHER): Payer: BC Managed Care – PPO | Admitting: Internal Medicine

## 2017-04-06 ENCOUNTER — Encounter: Payer: Self-pay | Admitting: Internal Medicine

## 2017-04-06 VITALS — BP 110/60 | HR 92 | Temp 97.7°F | Ht 66.0 in | Wt 225.5 lb

## 2017-04-06 DIAGNOSIS — R590 Localized enlarged lymph nodes: Secondary | ICD-10-CM | POA: Diagnosis not present

## 2017-04-06 NOTE — Assessment & Plan Note (Signed)
Abnormal lymphadenopathy right anterior cervical region, but no other concerning symptoms Will check ultrasound and evaluate further depending on ultrasound results May need CT scan or ENT referral

## 2017-04-06 NOTE — Patient Instructions (Signed)
A neck US was ordered.

## 2017-04-06 NOTE — Progress Notes (Signed)
Subjective:    Patient ID: Patricia Greene, female    DOB: 22-Apr-1956, 61 y.o.   MRN: [id number]  HPI She is here for an acute visit.   She had an enlarged lymph node on the right side of her neck a few weeks ago. There is no other lumps in the area. In the past few weeks she has noticed that her there are several palpable lymph nodes in the right side of her neck. They are slightly tender at times. She denies palpable lymph nodes elsewhere. She has some mild postnasal drip and cough medication because of that, but denies any other symptoms. She denies fever, chills, unexpected weight changes, nasal congestion, ear pain, sore throat and sinus pain.  Within the past month she has her breast implants removed. Surgery went well and she is healing well. She denies any breast redness or abnormal swelling.     Medications and allergies reviewed with patient and updated if appropriate.  Patient Active Problem List   Diagnosis Date Noted  . Closed wedge compression fracture of T11 vertebra ([place]) 03/15/2017  . Chest tightness 03/14/2017  . Reactive airway disease with wheezing 03/14/2017  . Acute bronchitis 03/14/2017  . Sinus pain 03/06/2017  . Back muscle spasm 10/04/2016  . Cough 09/26/2016  . Abnormal MRI, breast 04/20/2016  . Breast pain, left 04/15/2016  . H/O breast implant 04/15/2016  . Polyarthralgia 10/18/2015  . Osteopenia 02/01/2015  . Allergic rhinitis, cause unspecified 03/11/2011  . Vitamin D deficiency 03/13/2010  . HYPERCHOLESTEROLEMIA, BORDERLINE 03/13/2010  . MIGRAINE HEADACHE 03/13/2010  . CAROTID BRUIT 03/13/2010  . Hypothyroidism 03/09/2010  . INSOMNIA, CHRONIC 02/20/2008  . ANXIETY 09/06/2007  . FIBROMYALGIA 09/06/2007  . IRRITABLE BOWEL SYNDROME, HX OF 09/06/2007    Current Outpatient Prescriptions on File Prior to Visit  Medication Sig Dispense Refill  . albuterol (PROVENTIL HFA;VENTOLIN HFA) 108 (90 Base) MCG/ACT inhaler Inhale 2 puffs into the lungs  every 6 (six) hours as needed for wheezing or shortness of breath. 1 Inhaler 0  . baclofen (LIORESAL) 20 MG tablet Take 20 mg by mouth 4 (four) times daily as needed.     Marland Kitchen CALCIUM PO Take by mouth. 3 tabs in the morning 2 tabs at lunch     . chlorpheniramine-HYDROcodone (TUSSIONEX PENNKINETIC ER) 10-8 MG/5ML SUER Take 5 mLs by mouth every 12 (twelve) hours as needed for cough. 50 mL 0  . Cholecalciferol (VITAMIN D PO) Take by mouth. 2000 units daily     . levofloxacin (LEVAQUIN) 500 MG tablet Take 1 tablet (500 mg total) by mouth daily. 7 tablet 0  . LORazepam (ATIVAN) 1 MG tablet TAKE ONE TABLET BY MOUTH TWICE DAILY AS NEEDED FOR ANXIETY 60 tablet 1  . Multiple Vitamins-Minerals (CENTRUM SILVER) tablet Take 1 tablet by mouth daily.      Marland Kitchen NATURE-THROID 81.25 MG TABS 130 mg.   4  . traZODone (DESYREL) 50 MG tablet Take 0.5-1 tablets (25-50 mg total) by mouth at bedtime as needed for sleep. 30 tablet 3  . Vitamin D, Ergocalciferol, (DRISDOL) 50000 units CAPS capsule TAKE ONE CAPSULE BY MOUTH EVERY 7 DAYS 12 capsule 2   No current facility-administered medications on file prior to visit.     Past Medical History:  Diagnosis Date  . Allergic rhinitis   . Anxiety state, unspecified   . Arthritis    left thumb  . Carotid bruit   . Cervicalgia   . Diverticulosis   . Fibromyalgia   .  History of migraine headaches   . Hypothyroidism    nodular goiter  . IBS (irritable bowel syndrome)   . Insomnia   . Obesity   . Unspecified vitamin D deficiency     Past Surgical History:  Procedure Laterality Date  . BREAST ENHANCEMENT SURGERY    . COLONOSCOPY  1999, 2004 Sammuel Cooper), 08/09/2011   1999, 2004: diverticulosis  . THYROIDECTOMY  1973   right, toxic nodule    Social History   Social History  . Marital status: Divorced    Spouse name: N/A  . Number of children: 0  . Years of education: N/A   Occupational History  . RN    Social History Main Topics  . Smoking status: Never  Smoker  . Smokeless tobacco: Never Used  . Alcohol use No     Comment: rare   . Drug use: No  . Sexual activity: Not Asked   Other Topics Concern  . None   Social History Narrative   2-3 caffeine drinks daily    Divorced, no children. 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 Asthma Mother   . Breast cancer Mother   . Allergic rhinitis Mother   . Heart disease Mother   . Colon cancer Father 91       died at 17  . Colon polyps Sister   . Esophageal cancer Neg Hx   . Stomach cancer Neg Hx     Review of Systems  Constitutional: Negative for appetite change, chills, fever and unexpected weight change.  HENT: Positive for postnasal drip. Negative for congestion, ear pain, sinus pressure, sneezing and sore throat.   Respiratory: Positive for cough (PND). Negative for shortness of breath and wheezing.   Cardiovascular: Negative for chest pain.  Gastrointestinal: Negative for abdominal pain and nausea.  Neurological: Negative for dizziness, light-headedness and headaches.       Objective:   Vitals:   04/06/17 1553  BP: 110/60  Pulse: 92  Temp: 97.7 F (36.5 C)   Filed Weights   04/06/17 1553  Weight: 225 lb 8 oz (102.3 kg)   Body mass index is 36.4 kg/m.  Wt Readings from Last 3 Encounters:  04/06/17 225 lb 8 oz (102.3 kg)  03/14/17 222 lb (100.7 kg)  03/06/17 224 lb (101.6 kg)     Physical Exam  Constitutional: She appears well-developed and well-nourished. No distress.  HENT:  Head: Normocephalic and atraumatic.  Right Ear: External ear normal.  Left Ear: External ear normal.  Nose: Nose normal.  Mouth/Throat: Oropharynx is clear and moist. No oropharyngeal exudate.  Normal bilateral ear canals and tympanic membranes  Neck: Neck supple. No tracheal deviation present. No thyromegaly present.  Cardiovascular: Normal rate, regular rhythm and normal heart sounds.     Pulmonary/Chest: Effort normal and breath sounds normal. No respiratory distress. She has no wheezes. She has no rales.  Lymphadenopathy:    She has cervical adenopathy (Several palpable small pea-sized anterior cervical nodes, slightly tender; no occipital, submandibular, auricular or other cervical lymph nodes palpable).  Skin: Skin is warm and dry. No rash noted. She is not diaphoretic.          Assessment & Plan:   See Problem List for Assessment and Plan of chronic medical problems.

## 2017-04-08 ENCOUNTER — Other Ambulatory Visit: Payer: Self-pay | Admitting: Internal Medicine

## 2017-04-09 NOTE — Telephone Encounter (Signed)
RX faxed to [facility] 

## 2017-04-17 ENCOUNTER — Encounter: Payer: Self-pay | Admitting: Internal Medicine

## 2017-04-18 ENCOUNTER — Ambulatory Visit
Admission: RE | Admit: 2017-04-18 | Discharge: 2017-04-18 | Disposition: A | Discharge status: Home / Self Care | Payer: BC Managed Care – PPO | Source: Ambulatory Visit | Attending: Internal Medicine | Admitting: Internal Medicine

## 2017-04-18 ENCOUNTER — Encounter: Payer: Self-pay | Admitting: Internal Medicine

## 2017-04-18 DIAGNOSIS — R590 Localized enlarged lymph nodes: Secondary | ICD-10-CM

## 2017-04-20 ENCOUNTER — Telehealth: Payer: Self-pay | Admitting: Internal Medicine

## 2017-04-20 DIAGNOSIS — R590 Localized enlarged lymph nodes: Secondary | ICD-10-CM

## 2017-04-20 NOTE — Telephone Encounter (Signed)
Pt decided that she would like a referral to ENT

## 2017-05-01 ENCOUNTER — Encounter: Payer: BC Managed Care – PPO | Admitting: Internal Medicine

## 2017-06-15 ENCOUNTER — Emergency Department (HOSPITAL_COMMUNITY): Payer: BC Managed Care – PPO

## 2017-06-15 ENCOUNTER — Other Ambulatory Visit (INDEPENDENT_AMBULATORY_CARE_PROVIDER_SITE_OTHER): Payer: BC Managed Care – PPO

## 2017-06-15 ENCOUNTER — Ambulatory Visit (INDEPENDENT_AMBULATORY_CARE_PROVIDER_SITE_OTHER): Payer: BC Managed Care – PPO | Admitting: Internal Medicine

## 2017-06-15 ENCOUNTER — Encounter (HOSPITAL_COMMUNITY): Payer: Self-pay | Admitting: Emergency Medicine

## 2017-06-15 ENCOUNTER — Encounter: Payer: Self-pay | Admitting: Internal Medicine

## 2017-06-15 ENCOUNTER — Emergency Department (HOSPITAL_COMMUNITY)
Admission: EM | Admit: 2017-06-15 | Discharge: 2017-06-15 | Disposition: A | Discharge status: Home / Self Care | Payer: BC Managed Care – PPO | Attending: Emergency Medicine | Admitting: Emergency Medicine

## 2017-06-15 VITALS — BP 108/58 | HR 97 | Temp 99.7°F | Resp 18 | Wt 220.0 lb

## 2017-06-15 DIAGNOSIS — J45909 Unspecified asthma, uncomplicated: Secondary | ICD-10-CM | POA: Diagnosis not present

## 2017-06-15 DIAGNOSIS — R1013 Epigastric pain: Secondary | ICD-10-CM | POA: Diagnosis not present

## 2017-06-15 DIAGNOSIS — K8020 Calculus of gallbladder without cholecystitis without obstruction: Secondary | ICD-10-CM | POA: Diagnosis not present

## 2017-06-15 DIAGNOSIS — R109 Unspecified abdominal pain: Secondary | ICD-10-CM | POA: Diagnosis present

## 2017-06-15 LAB — COMPREHENSIVE METABOLIC PANEL
ALT: 847 U/L — AB (ref 0–35)
AST: 1076 U/L — ABNORMAL HIGH (ref 0–37)
Albumin: 4 g/dL (ref 3.5–5.2)
Alkaline Phosphatase: 191 U/L — ABNORMAL HIGH (ref 39–117)
BILIRUBIN TOTAL: 1.8 mg/dL — AB (ref 0.2–1.2)
BUN: 13 mg/dL (ref 6–23)
CHLORIDE: 103 meq/L (ref 96–112)
CO2: 25 meq/L (ref 19–32)
Calcium: 9.3 mg/dL (ref 8.4–10.5)
Creatinine, Ser: 0.93 mg/dL (ref 0.40–1.20)
GFR: 64.66 mL/min (ref >60.00)
GLUCOSE: 108 mg/dL — AB (ref 70–99)
Potassium: 3.6 mEq/L (ref 3.5–5.1)
Sodium: 136 mEq/L (ref 135–145)
Total Protein: 6.4 g/dL (ref 6.0–8.3)

## 2017-06-15 LAB — CBC WITH DIFFERENTIAL/PLATELET
BASOS ABS: 0 10*3/uL (ref 0.0–0.1)
BASOS PCT: 0.1 % (ref 0.0–3.0)
EOS PCT: 0.1 % (ref 0.0–5.0)
Eosinophils Absolute: 0 10*3/uL (ref 0.0–0.7)
HEMATOCRIT: 37.2 % (ref 36.0–46.0)
Hemoglobin: 12.2 g/dL (ref 12.0–15.0)
LYMPHS ABS: 0.7 10*3/uL (ref 0.7–4.0)
LYMPHS PCT: 7.1 % — AB (ref 12.0–46.0)
MCHC: 32.9 g/dL (ref 30.0–36.0)
MCV: 91.8 fl (ref 78.0–100.0)
MONOS PCT: 7.4 % (ref 3.0–12.0)
Monocytes Absolute: 0.7 10*3/uL (ref 0.1–1.0)
NEUTROS ABS: 7.9 10*3/uL — AB (ref 1.4–7.7)
PLATELETS: 220 10*3/uL (ref 150.0–400.0)
RBC: 4.05 Mil/uL (ref 3.87–5.11)
RDW: 15.2 % (ref 11.5–15.5)
WBC: 9.3 10*3/uL (ref 4.0–10.5)

## 2017-06-15 LAB — URINALYSIS, ROUTINE W REFLEX MICROSCOPIC
BILIRUBIN URINE: NEGATIVE
Glucose, UA: NEGATIVE mg/dL
HGB URINE DIPSTICK: NEGATIVE
Ketones, ur: NEGATIVE mg/dL
Leukocytes, UA: NEGATIVE
Nitrite: NEGATIVE
PH: 6 (ref 5.0–8.0)
Protein, ur: NEGATIVE mg/dL
SPECIFIC GRAVITY, URINE: 1.018 (ref 1.005–1.030)

## 2017-06-15 LAB — AMYLASE: Amylase: 24 U/L — ABNORMAL LOW (ref 27–131)

## 2017-06-15 LAB — LIPASE: Lipase: 29 U/L (ref 11.0–59.0)

## 2017-06-15 MED ORDER — SODIUM CHLORIDE 0.9 % IV BOLUS (SEPSIS)
1000.0000 mL | Freq: Once | INTRAVENOUS | Status: AC
  Administered 2017-06-15: 1000 mL via INTRAVENOUS

## 2017-06-15 MED ORDER — MORPHINE SULFATE (PF) 2 MG/ML IV SOLN
1.0000 mg | Freq: Once | INTRAVENOUS | Status: AC
  Administered 2017-06-15: 1 mg via INTRAVENOUS
  Filled 2017-06-15: qty 1

## 2017-06-15 MED ORDER — IBUPROFEN 200 MG PO TABS: 400.0000 mg | ORAL_TABLET | Freq: Once | ORAL | Status: DC

## 2017-06-15 MED ORDER — SUCRALFATE 1 GM/10ML PO SUSP: 1.0000 g | Freq: Three times a day (TID) | ORAL | 0 refills | Status: DC

## 2017-06-15 MED ORDER — ONDANSETRON 4 MG PO TBDP: 4.0000 mg | ORAL_TABLET | Freq: Three times a day (TID) | ORAL | 0 refills | Status: DC | PRN

## 2017-06-15 MED ORDER — HYOSCYAMINE SULFATE SL 0.125 MG SL SUBL: 0.1250 mg | SUBLINGUAL_TABLET | SUBLINGUAL | 0 refills | Status: DC | PRN

## 2017-06-15 NOTE — Progress Notes (Signed)
Subjective:    Patient ID: Patricia Greene, female    DOB: [birth year], 61 y.o.   MRN: [id number]  HPI She is here for an acute visit abdominal pain.   She was on lovenox just before surgery and then placed on Augmentin for 6 days for facial cellulitis.  She stopped the Augmentin early (only took 6 days).  She had severe epigastric abdominal pain.  She has abdominal bloating, cramping.  She has had nausea, vomited yesterday only - it was just acid, minimal diarrhea if she eats something, headaches and lightheadeness  She feels dehydrated.  She has concentrated urine, lightheadedness and a headache.   Last night she had true chills/rigors.  She had to take a 1/2 of a pain pill from her breast surgery.      She had GERD last night only from overeating.  Prior to that she has not had GERD.  nexium is not working - she is taking it over the counter.   Has not taken any steroids or nsaids recently.    She denies prior problems with her GB and pancreatitis.  Medications and allergies reviewed with patient and updated if appropriate.  Patient Active Problem List   Diagnosis Date Noted  . Lymphadenopathy of right cervical region 04/06/2017  . Closed wedge compression fracture of T11 vertebra ([place]) 03/15/2017  . Chest tightness 03/14/2017  . Reactive airway disease with wheezing 03/14/2017  . Acute bronchitis 03/14/2017  . Sinus pain 03/06/2017  . Back muscle spasm 10/04/2016  . Cough 09/26/2016  . Abnormal MRI, breast 04/20/2016  . Breast pain, left 04/15/2016  . H/O breast implant 04/15/2016  . Polyarthralgia 10/18/2015  . Osteopenia 02/01/2015  . Allergic rhinitis, cause unspecified 03/11/2011  . Vitamin D deficiency 03/13/2010  . HYPERCHOLESTEROLEMIA, BORDERLINE 03/13/2010  . MIGRAINE HEADACHE 03/13/2010  . CAROTID BRUIT 03/13/2010  . Hypothyroidism 03/09/2010  . INSOMNIA, CHRONIC 02/20/2008  . ANXIETY 09/06/2007  . FIBROMYALGIA 09/06/2007  . IRRITABLE BOWEL SYNDROME, HX OF  09/06/2007    Current Outpatient Prescriptions on File Prior to Visit  Medication Sig Dispense Refill  . albuterol (PROVENTIL HFA;VENTOLIN HFA) 108 (90 Base) MCG/ACT inhaler Inhale 2 puffs into the lungs every 6 (six) hours as needed for wheezing or shortness of breath. 1 Inhaler 0  . baclofen (LIORESAL) 20 MG tablet Take 20 mg by mouth 4 (four) times daily as needed.     Marland Kitchen CALCIUM PO Take by mouth. 3 tabs in the morning 2 tabs at lunch     . Cholecalciferol (VITAMIN D PO) Take by mouth. 2000 units daily     . LORazepam (ATIVAN) 1 MG tablet TAKE 1 TABLET BY MOUTH TWICE DAILY AS NEEDED FOR ANXIETY 60 tablet 1  . Multiple Vitamins-Minerals (CENTRUM SILVER) tablet Take 1 tablet by mouth daily.      Marland Kitchen NATURE-THROID 81.25 MG TABS 130 mg.   4  . traZODone (DESYREL) 50 MG tablet Take 0.5-1 tablets (25-50 mg total) by mouth at bedtime as needed for sleep. 30 tablet 3  . Vitamin D, Ergocalciferol, (DRISDOL) 50000 units CAPS capsule TAKE ONE CAPSULE BY MOUTH EVERY 7 DAYS 12 capsule 2   No current facility-administered medications on file prior to visit.     Past Medical History:  Diagnosis Date  . Allergic rhinitis   . Anxiety state, unspecified   . Arthritis    left thumb  . Carotid bruit   . Cervicalgia   . Diverticulosis   . Fibromyalgia   .  History of migraine headaches   . Hypothyroidism    nodular goiter  . IBS (irritable bowel syndrome)   . Insomnia   . Obesity   . Unspecified vitamin D deficiency     Past Surgical History:  Procedure Laterality Date  . BREAST ENHANCEMENT SURGERY    . COLONOSCOPY  1999, 2004 Sammuel Cooper), 08/09/2011   1999, 2004: diverticulosis  . THYROIDECTOMY  1973   right, toxic nodule    Social History   Social History  . Marital status: Divorced    Spouse name: N/A  . Number of children: 0  . Years of education: N/A   Occupational History  . RN    Social History Main Topics  . Smoking status: Never Smoker  . Smokeless tobacco: Never Used  .  Alcohol use No     Comment: rare   . Drug use: No  . Sexual activity: Not on file   Other Topics Concern  . Not on file   Social History Narrative   2-3 caffeine drinks daily    Divorced, no children. 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 Asthma Mother   . Breast cancer Mother   . Allergic rhinitis Mother   . Heart disease Mother   . Colon cancer Father 49       died at 53  . Colon polyps Sister   . Esophageal cancer Neg Hx   . Stomach cancer Neg Hx     Review of Systems  Constitutional: Positive for appetite change (decreased), chills and fever.  Gastrointestinal: Positive for abdominal distention, abdominal pain (epigastric region), diarrhea (mild at times), nausea and vomiting (last night only - acid only). Negative for blood in stool (none black stools) and constipation.  Neurological: Positive for light-headedness and headaches.       Objective:   Vitals:   06/15/17 1108  BP: (!) 108/58  Pulse: 97  Resp: 18  Temp: 99.7 F (37.6 C)  SpO2: 96%   Filed Weights   06/15/17 1108  Weight: 220 lb (99.8 kg)   Body mass index is 35.51 kg/m.  Wt Readings from Last 3 Encounters:  06/15/17 220 lb (99.8 kg)  04/06/17 225 lb 8 oz (102.3 kg)  03/14/17 222 lb (100.7 kg)     Physical Exam  Constitutional: She appears well-developed. No distress.  HENT:  Head: Normocephalic and atraumatic.  Abdominal: Soft. She exhibits no distension and no mass. There is tenderness (epigrastric region -mild- moderate). There is no rebound and no guarding.  Musculoskeletal: She exhibits no edema.  Skin: Skin is warm and dry. She is not diaphoretic.          Assessment & Plan:   See Problem List for Assessment and Plan of chronic medical problems.

## 2017-06-15 NOTE — Assessment & Plan Note (Signed)
Gastritis vs GB disease vs pancreatitis Cbc, cmp, amylase, lipase stat Will start levsin, carafate, zofran now Discussed possibly getting imaging vs waiting to see if symptoms improve - will see what labs show and if no improvement will need imaging

## 2017-06-15 NOTE — ED Provider Notes (Signed)
[facility] DEPT Provider Note   CSN: [id number] Arrival date & time: 06/15/17  1259     History   Chief Complaint Chief Complaint  Patient presents with  . Abdominal Pain    HPI Patricia Greene is a 61 y.o. female.  HPI Patient, with a past medical history of hypothyroidism, presents to ED for evaluation of sharp, intermittent upper abdominal pain for the past 4 weeks, worse after meals. Yesterday she had chills and rigors. She was seen by her PCP this morning and was sent to the ED for further evaluation of her elevated liver enzymes. She had her breast implants removed 3 months ago after having them for about 20 years. She did read articles about how it could cause liver cancer. She denies any prior abdominal surgery, diarrhea, constipation, chest pain, shortness of breath, blood in stool.  Past Medical History:  Diagnosis Date  . Allergic rhinitis   . Anxiety state, unspecified   . Arthritis    left thumb  . Carotid bruit   . Cervicalgia   . Diverticulosis   . Fibromyalgia   . History of migraine headaches   . Hypothyroidism    nodular goiter  . IBS (irritable bowel syndrome)   . Insomnia   . Obesity   . Unspecified vitamin D deficiency     Patient Active Problem List   Diagnosis Date Noted  . Epigastric pain 06/15/2017  . Lymphadenopathy of right cervical region 04/06/2017  . Closed wedge compression fracture of T11 vertebra ([place]) 03/15/2017  . Chest tightness 03/14/2017  . Reactive airway disease with wheezing 03/14/2017  . Acute bronchitis 03/14/2017  . Sinus pain 03/06/2017  . Back muscle spasm 10/04/2016  . Cough 09/26/2016  . Abnormal MRI, breast 04/20/2016  . Breast pain, left 04/15/2016  . H/O breast implant 04/15/2016  . Polyarthralgia 10/18/2015  . Osteopenia 02/01/2015  . Allergic rhinitis, cause unspecified 03/11/2011  . Vitamin D deficiency 03/13/2010  . HYPERCHOLESTEROLEMIA, BORDERLINE 03/13/2010  . MIGRAINE HEADACHE 03/13/2010  .  CAROTID BRUIT 03/13/2010  . Hypothyroidism 03/09/2010  . INSOMNIA, CHRONIC 02/20/2008  . ANXIETY 09/06/2007  . FIBROMYALGIA 09/06/2007  . IRRITABLE BOWEL SYNDROME, HX OF 09/06/2007    Past Surgical History:  Procedure Laterality Date  . BREAST ENHANCEMENT SURGERY    . COLONOSCOPY  1999, 2004 Sammuel Cooper), 08/09/2011   1999, 2004: diverticulosis  . THYROIDECTOMY  1973   right, toxic nodule    OB History    No data available       Home Medications    Prior to Admission medications   Medication Sig Start Date End Date Taking? Authorizing Provider  CALCIUM PO Take 1 tablet by mouth 2 (two) times daily.    Yes [provider]  Multiple Vitamins-Minerals (CENTRUM SILVER) tablet Take 1 tablet by mouth daily.     Yes [provider]  thyroid (ARMOUR) 130 MG tablet Take 130 mg by mouth daily.   Yes [provider]  Vitamin D, Ergocalciferol, (DRISDOL) 50000 units CAPS capsule TAKE ONE CAPSULE BY MOUTH EVERY 7 DAYS 03/14/17  Yes Burns, Claudina Lick, MD  albuterol (PROVENTIL HFA;VENTOLIN HFA) 108 (90 Base) MCG/ACT inhaler Inhale 2 puffs into the lungs every 6 (six) hours as needed for wheezing or shortness of breath. 03/15/17   Binnie Rail, MD  baclofen (LIORESAL) 20 MG tablet Take 20 mg by mouth 4 (four) times daily as needed for muscle spasms.     [provider]  cefadroxil (DURICEF) 500 MG  capsule cefadroxil 500 mg capsule    [provider]  fluconazole (DIFLUCAN) 150 MG tablet fluconazole 150 mg tablet    [provider]  Hyoscyamine Sulfate SL (LEVSIN/SL) 0.125 MG SUBL Place 0.125 mg under the tongue every 4 (four) hours as needed. 06/15/17   Burns, Claudina Lick, MD  LORazepam (ATIVAN) 1 MG tablet TAKE 1 TABLET BY MOUTH TWICE DAILY AS NEEDED FOR ANXIETY 04/09/17   Burns, Claudina Lick, MD  meloxicam (MOBIC) 15 MG tablet meloxicam 15 mg tablet  TAKE 1 TABLET (15 MG TOTAL) BY MOUTH DAILY.    [provider]  ondansetron (ZOFRAN ODT) 4 MG  disintegrating tablet Take 1 tablet (4 mg total) by mouth every 8 (eight) hours as needed for nausea or vomiting. 06/15/17   Binnie Rail, MD  sucralfate (CARAFATE) 1 GM/10ML suspension Take 10 mLs (1 g total) by mouth 4 (four) times daily -  with meals and at bedtime. 06/15/17   Binnie Rail, MD  traMADol (ULTRAM) 50 MG tablet tramadol 50 mg tablet    [provider]  traZODone (DESYREL) 50 MG tablet Take 0.5-1 tablets (25-50 mg total) by mouth at bedtime as needed for sleep. 01/02/17   Binnie Rail, MD    Family History Family History  Problem Relation Age of Onset  . Asthma Mother   . Breast cancer Mother   . Allergic rhinitis Mother   . Heart disease Mother   . Colon cancer Father 102       died at 62  . Colon polyps Sister   . Esophageal cancer Neg Hx   . Stomach cancer Neg Hx     Social History Social History  Substance Use Topics  . Smoking status: Never Smoker  . Smokeless tobacco: Never Used  . Alcohol use No     Comment: rare      Allergies   Patient has no known allergies.   Review of Systems Review of Systems  Constitutional: Positive for appetite change and chills. Negative for fever.  HENT: Negative for ear pain, rhinorrhea, sneezing and sore throat.   Eyes: Negative for photophobia and visual disturbance.  Respiratory: Negative for cough, chest tightness, shortness of breath and wheezing.   Cardiovascular: Negative for chest pain and palpitations.  Gastrointestinal: Positive for abdominal pain, nausea and vomiting. Negative for blood in stool, constipation and diarrhea.  Genitourinary: Negative for dysuria, hematuria and urgency.  Musculoskeletal: Negative for myalgias.  Skin: Negative for rash.  Neurological: Negative for dizziness, weakness and light-headedness.     Physical Exam Updated Vital Signs BP (!) 101/47 (BP Location: Right Arm)   Pulse 77   Temp 99.4 F (37.4 C) (Oral)   Resp 12   SpO2 99%   Physical Exam  Constitutional:  She is oriented to person, place, and time. She appears well-developed and well-nourished. No distress.  Appears uncomfortable but nontoxic-appearing.  HENT:  Head: Normocephalic and atraumatic.  Nose: Nose normal.  Eyes: Conjunctivae and EOM are normal. Left eye exhibits no discharge. No scleral icterus.  Neck: Normal range of motion. Neck supple.  Cardiovascular: Normal rate, regular rhythm, normal heart sounds and intact distal pulses.  Exam reveals no gallop and no friction rub.   No murmur heard. Pulmonary/Chest: Effort normal and breath sounds normal. No respiratory distress.  Abdominal: Soft. Bowel sounds are normal. She exhibits no distension. There is tenderness. There is no guarding.    Musculoskeletal: Normal range of motion. She exhibits no edema.  Neurological:  She is alert and oriented to person, place, and time. No cranial nerve deficit or sensory deficit. She exhibits normal muscle tone. Coordination normal.  Pupils reactive. No facial asymmetry noted. Cranial nerves appear grossly intact. Sensation intact to light touch on face, BUE and BLE. Strength 5/5 in BUE and BLE.  Skin: Skin is warm and dry. No rash noted.  Psychiatric: She has a normal mood and affect.  Nursing note and vitals reviewed.    ED Treatments / Results  Labs (all labs ordered are listed, but only abnormal results are displayed) Labs Reviewed  URINALYSIS, ROUTINE W REFLEX MICROSCOPIC - Abnormal; Notable for the following:       Result Value   Color, Urine AMBER (*)    All other components within normal limits    EKG  EKG Interpretation None       Radiology US Abdomen Limited  Result Date: 06/15/2017 CLINICAL DATA:  Right upper quadrant pain. EXAM: ULTRASOUND ABDOMEN LIMITED RIGHT UPPER QUADRANT COMPARISON:  None. FINDINGS: Gallbladder: Joaquim Lai is identified within the neck of gallbladder measuring 1 cm. No gallbladder wall thickening or pericholecystic fluid. Negative sonographic Murphy's  sign. Common bile duct: Diameter: 2.9 mm Liver: Shadowing echogenic focus within the central right lobe of liver is identified which may represent an area of calcification. Portal vein is patent on color Doppler imaging with normal direction of blood flow towards the liver. IMPRESSION: 1. Gallstone. 2. No gallbladder wall thickening and negative sonographic Murphy's sign. Electronically Signed   By: Kerby Moors M.D.   On: 06/15/2017 18:53    Procedures Procedures (including critical care time)  Medications Ordered in ED Medications  morphine 2 MG/ML injection 1 mg (not administered)  sodium chloride 0.9 % bolus 1,000 mL (0 mLs Intravenous Stopped 06/15/17 1922)     Initial Impression / Assessment and Plan / ED Course  I have reviewed the triage vital signs and the nursing notes.  Pertinent labs & imaging results that were available during my care of the patient were reviewed by me and considered in my medical decision making (see chart for details).     Patient presents to ED for evaluation of intermittent upper abdominal pain for the past 4 weeks. Pain worse after meals. She was seen by her PCP this morning and was sent to ED for her elevated LFTs and imaging. On physical exam she does have some epigastric and upper abdominal tenderness to palpation. She is otherwise nontoxic appearing and in no acute distress. Remainder labs including CBC, lipase, amylase unremarkable, no leukocytosis. Urinalysis with no evidence of UTI. Ultrasound showing evidence of gallstone but no wall thickening and no Murphy sign. Her PCP did prescribe her side and normal medications including Carafate, Zofran, Levsin. Patient did get these medications filled at pharmacy prior to arrival here. I encouraged her to follow up at surgery Center for elective cholecystectomy if needed. I also encouraged her to continue the medications that her PCP prescribed to her. Patient appears stable for discharge at this time. She did  not want any ibuprofen to help with her headache or temperature, and she states that she will go home and take medications. Strict return precautions given.  Final Clinical Impressions(s) / ED Diagnoses   Final diagnoses:  Gallstones  Epigastric pain    New Prescriptions New Prescriptions   No medications on file     Delia Heady, Hershal Coria 06/16/17 Richrd Humbles, MD 06/17/17 [id number]

## 2017-06-15 NOTE — Patient Instructions (Signed)
  Test(s) ordered today. Your results will be released to [facility] (or called to you) after review, usually within 72hours after test completion. If any changes need to be made, you will be notified at that same time.  Medications reviewed and updated.  Changes include an anti-nausea medication, carafate for the stomach and a medication for stomach cramping.   Your prescription(s) have been submitted to your pharmacy. Please take as directed and contact our office if you believe you are having problem(s) with the medication(s).

## 2017-06-15 NOTE — Discharge Instructions (Signed)
Please read attached information regarding your condition. Follow-up with surgery center also below for further evaluation. Continue home medications as prescribed by your PCP. Return to ED for worsening abdominal pain, fevers, increased vomiting, lightheadedness, loss of consciousness.

## 2017-06-15 NOTE — ED Triage Notes (Addendum)
Pt reports intermittent abd pain for the past several months that has gradually gotten worse. Threw up last night. Pt went to PCP today who drew bloodwork. Sent pt to ED for further eval for elevated liver enzymes.

## 2017-06-15 NOTE — ED Notes (Signed)
Pt.'s ride is at bedside to pick up pt. Pt. Stated she felt better and ready to go home.

## 2017-06-15 NOTE — ED Notes (Signed)
Pt. Stated that she's driving home once discharge, told this Nurse that she felt a little dizzy/light headed after morphine IV was given and wanted to stay a little bit longer until light headedness gone. Pt. Stated that she's scared to drive home now with the heavy rain outside. PA and Charge Nurse Lattie Haw made aware.

## 2017-06-18 ENCOUNTER — Inpatient Hospital Stay (HOSPITAL_COMMUNITY)
Admission: AD | Admit: 2017-06-18 | Discharge: 2017-06-22 | DRG: 418 | Disposition: A | Discharge status: Home / Self Care | Payer: BC Managed Care – PPO | Source: Ambulatory Visit | Attending: Surgery | Admitting: Surgery

## 2017-06-18 ENCOUNTER — Other Ambulatory Visit: Payer: Self-pay | Admitting: General Surgery

## 2017-06-18 ENCOUNTER — Ambulatory Visit: Payer: Self-pay | Admitting: Surgery

## 2017-06-18 DIAGNOSIS — K9171 Accidental puncture and laceration of a digestive system organ or structure during a digestive system procedure: Secondary | ICD-10-CM | POA: Diagnosis not present

## 2017-06-18 DIAGNOSIS — Y658 Other specified misadventures during surgical and medical care: Secondary | ICD-10-CM | POA: Diagnosis present

## 2017-06-18 DIAGNOSIS — E7800 Pure hypercholesterolemia, unspecified: Secondary | ICD-10-CM | POA: Diagnosis present

## 2017-06-18 DIAGNOSIS — E039 Hypothyroidism, unspecified: Secondary | ICD-10-CM | POA: Diagnosis present

## 2017-06-18 DIAGNOSIS — S36400A Unspecified injury of duodenum, initial encounter: Secondary | ICD-10-CM | POA: Diagnosis not present

## 2017-06-18 DIAGNOSIS — K810 Acute cholecystitis: Secondary | ICD-10-CM

## 2017-06-18 DIAGNOSIS — Z79899 Other long term (current) drug therapy: Secondary | ICD-10-CM | POA: Diagnosis not present

## 2017-06-18 DIAGNOSIS — M797 Fibromyalgia: Secondary | ICD-10-CM | POA: Diagnosis present

## 2017-06-18 DIAGNOSIS — S36430A Laceration of duodenum, initial encounter: Secondary | ICD-10-CM | POA: Diagnosis not present

## 2017-06-18 DIAGNOSIS — K631 Perforation of intestine (nontraumatic): Secondary | ICD-10-CM

## 2017-06-18 DIAGNOSIS — G4700 Insomnia, unspecified: Secondary | ICD-10-CM | POA: Diagnosis present

## 2017-06-18 DIAGNOSIS — F419 Anxiety disorder, unspecified: Secondary | ICD-10-CM | POA: Diagnosis present

## 2017-06-18 DIAGNOSIS — K8010 Calculus of gallbladder with chronic cholecystitis without obstruction: Principal | ICD-10-CM | POA: Diagnosis present

## 2017-06-18 DIAGNOSIS — E876 Hypokalemia: Secondary | ICD-10-CM | POA: Diagnosis present

## 2017-06-18 DIAGNOSIS — K811 Chronic cholecystitis: Secondary | ICD-10-CM | POA: Diagnosis present

## 2017-06-18 DIAGNOSIS — R1013 Epigastric pain: Secondary | ICD-10-CM | POA: Diagnosis present

## 2017-06-18 LAB — CBC WITH DIFFERENTIAL/PLATELET
BASOS PCT: 0 %
Basophils Absolute: 0 10*3/uL (ref 0.0–0.1)
EOS ABS: 0.1 10*3/uL (ref 0.0–0.7)
EOS PCT: 1 %
HCT: 36.3 % (ref 36.0–46.0)
Hemoglobin: 12.2 g/dL (ref 12.0–15.0)
LYMPHS ABS: 1.9 10*3/uL (ref 0.7–4.0)
Lymphocytes Relative: 23 %
MCH: 30 pg (ref 26.0–34.0)
MCHC: 33.6 g/dL (ref 30.0–36.0)
MCV: 89.4 fL (ref 78.0–100.0)
Monocytes Absolute: 1.1 10*3/uL — ABNORMAL HIGH (ref 0.1–1.0)
Monocytes Relative: 13 %
Neutro Abs: 5.2 10*3/uL (ref 1.7–7.7)
Neutrophils Relative %: 63 %
PLATELETS: 221 10*3/uL (ref 150–400)
RBC: 4.06 MIL/uL (ref 3.87–5.11)
RDW: 14.3 % (ref 11.5–15.5)
WBC: 8.3 10*3/uL (ref 4.0–10.5)

## 2017-06-18 LAB — COMPREHENSIVE METABOLIC PANEL
ALBUMIN: 4.1 g/dL (ref 3.5–5.0)
ALT: 238 U/L — AB (ref 14–54)
ANION GAP: 12 (ref 5–15)
AST: 46 U/L — ABNORMAL HIGH (ref 15–41)
Alkaline Phosphatase: 167 U/L — ABNORMAL HIGH (ref 38–126)
BUN: 9 mg/dL (ref 6–20)
CHLORIDE: 102 mmol/L (ref 101–111)
CO2: 23 mmol/L (ref 22–32)
Calcium: 9 mg/dL (ref 8.9–10.3)
Creatinine, Ser: 0.92 mg/dL (ref 0.44–1.00)
GFR calc non Af Amer: >60 mL/min (ref >60)
Glucose, Bld: 98 mg/dL (ref 65–99)
Potassium: 3 mmol/L — ABNORMAL LOW (ref 3.5–5.1)
Sodium: 137 mmol/L (ref 135–145)
Total Bilirubin: 1.1 mg/dL (ref 0.3–1.2)
Total Protein: 7.1 g/dL (ref 6.5–8.1)

## 2017-06-18 MED ORDER — ENOXAPARIN SODIUM 40 MG/0.4ML ~~LOC~~ SOLN
40.0000 mg | SUBCUTANEOUS | Status: DC
  Administered 2017-06-18 – 2017-06-21 (×4): 40 mg via SUBCUTANEOUS
  Filled 2017-06-18 (×5): qty 0.4

## 2017-06-18 MED ORDER — ONDANSETRON 4 MG PO TBDP: 4.0000 mg | ORAL_TABLET | Freq: Four times a day (QID) | ORAL | Status: DC | PRN

## 2017-06-18 MED ORDER — PROMETHAZINE HCL 25 MG/ML IJ SOLN
12.5000 mg | INTRAMUSCULAR | Status: DC | PRN
  Administered 2017-06-18 – 2017-06-20 (×6): 12.5 mg via INTRAVENOUS
  Filled 2017-06-18 (×7): qty 1

## 2017-06-18 MED ORDER — ONDANSETRON HCL 4 MG/2ML IJ SOLN
4.0000 mg | Freq: Four times a day (QID) | INTRAMUSCULAR | Status: DC | PRN
  Administered 2017-06-19: 4 mg via INTRAVENOUS

## 2017-06-18 MED ORDER — DEXTROSE 5 % IV SOLN
2.0000 g | INTRAVENOUS | Status: DC
  Administered 2017-06-18: 2 g via INTRAVENOUS
  Filled 2017-06-18 (×2): qty 2

## 2017-06-18 MED ORDER — DEXTROSE-NACL 5-0.9 % IV SOLN
INTRAVENOUS | Status: DC
  Administered 2017-06-18: 20:00:00 via INTRAVENOUS

## 2017-06-18 MED ORDER — FENTANYL CITRATE (PF) 100 MCG/2ML IJ SOLN
50.0000 ug | INTRAMUSCULAR | Status: DC | PRN
  Administered 2017-06-18 – 2017-06-20 (×15): 50 ug via INTRAVENOUS
  Filled 2017-06-18 (×10): qty 2

## 2017-06-18 NOTE — H&P (Signed)
Patricia Greene 06/18/2017 11:24 AM Location: [facility] Patient #: [id number] DOB: 10-18-1955 Divorced / Language: Cleophus Molt / Race: White Female  History of Present Illness Marcello Moores A. Linden Mikes MD; 06/18/2017 11:54 AM) Patient words: Patient sent at the request of the emergency room for epigastric pain, nausea, vomiting and poor by mouth intake. She was seen last week by her primary care doctor for epigastric pain has been treated for what sounds like peptic ulcer disease. She had elevation in liver function studies were sent to the emergency room Friday and discharged home from there. She reports subjective fever and chills over the weekend. She reports poor by mouth intake and epigastric pain. Ultrasound was done which shows gallstones with a normal size common bile duct. Her white count was normal but she had a total bilirubin 1.8 and significant elevation of her AST and ALT. She is felt that all weekend and comes to clinic today. She is still making some urine was dark she states. She cannot keep any food or liquids down most part.        Results for Patricia Greene, Patricia Greene (MRN [id number]) as of 06/18/2017 11:49 Ref. Range 06/15/2017 11:33 Sodium Latest Ref Range: 135 - 145 mEq/L 136 Potassium Latest Ref Range: 3.5 - 5.1 mEq/L 3.6 Chloride Latest Ref Range: 96 - 112 mEq/L 103 CO2 Latest Ref Range: 19 - 32 mEq/L 25 Glucose Latest Ref Range: 70 - 99 mg/dL 108 (H) BUN Latest Ref Range: 6 - 23 mg/dL 13 Creatinine Latest Ref Range: 0.40 - 1.20 mg/dL 0.93 Calcium Latest Ref Range: 8.4 - 10.5 mg/dL 9.3 Alkaline Phosphatase Latest Ref Range: 39 - 117 U/L 191 (H) Albumin Latest Ref Range: 3.5 - 5.2 g/dL 4.0 Amylase, Serum Latest Ref Range: 27 - 131 U/L 24 (L) Lipase Latest Ref Range: 11.0 - 59.0 U/L 29.0 AST Latest Ref Range: 0 - 37 U/L 1,076 (H) ALT Latest Ref Range: 0 - 35 U/L 847 (H) Total Protein Latest Ref Range: 6.0 - 8.3 g/dL 6.4 Total Bilirubin Latest Ref Range: 0.2 - 1.2  mg/dL 1.8 (H) GFR Latest Ref Range: >60.00 mL/min 64.66 WBC Latest Ref Range: 4.0 - 10.5 K/uL 9.3 RBC Latest Ref Range: 3.87 - 5.11 Mil/uL 4.05 Hemoglobin Latest Ref Range: 12.0 - 15.0 g/dL 12.2 HCT Latest Ref Range: 36.0 - 46.0 % 37.2 MCV Latest Ref Range: 78.0 - 100.0 fl 91.8 MCHC Latest Ref Range: 30.0 - 36.0 g/dL 32.9 RDW Latest Ref Range: 11.5 - 15.5 % 15.2 Platelets Latest Ref Range: 150.0 - 400.0 K/uL 220.0 Neutrophils Latest Ref Range: 43.0 - 77.0 % 85.3 Repeated and... (H) Lymphocytes Latest Ref Range: 12.0 - 46.0 % 7.1 (L) Monocytes Relative Latest Ref Range: 3.0 - 12.0 % 7.4 Eosinophil Latest Ref Range: 0.0 - 5.0 % 0.1 Basophil Latest Ref Range: 0.0 - 3.0 % 0.1 NEUT# Latest Ref Range: 1.4 - 7.7 K/uL 7.9 (H) Lymphocyte # Latest Ref Range: 0.7 - 4.0 K/uL 0.7 Monocyte # Latest Ref Range: 0.1 - 1.0 K/uL 0.7 Eosinophils Absolute Latest Ref Range: 0.0 - 0.7 K/uL 0.0 Basophils Absolute Latest Ref Range: 0.0 - 0.1 K/uL 0.0              CLINICAL DATA: Right upper quadrant pain.  EXAM: ULTRASOUND ABDOMEN LIMITED RIGHT UPPER QUADRANT  COMPARISON: None.  FINDINGS: Gallbladder:  Joaquim Lai is identified within the neck of gallbladder measuring 1 cm. No gallbladder wall thickening or pericholecystic fluid. Negative sonographic Murphy's sign.  Common bile duct:  Diameter: 2.9 mm  Liver:  Shadowing echogenic focus within the central right lobe of liver is identified which may represent an area of calcification. Portal vein is patent on color Doppler imaging with normal direction of blood flow towards the liver.  IMPRESSION: 1. Gallstone. 2. No gallbladder wall thickening and negative sonographic Murphy's sign.   Electronically Signed By: Kerby Moors M.D. On: 06/15/2017 18:53     CLINICAL DATA: Right upper quadrant pain.  EXAM: ULTRASOUND ABDOMEN LIMITED RIGHT UPPER QUADRANT  COMPARISON: None.  FINDINGS: Gallbladder:  Joaquim Lai is identified  within the neck of gallbladder measuring 1 cm. No gallbladder wall thickening or pericholecystic fluid. Negative sonographic Murphy's sign.  Common bile duct:  Diameter: 2.9 mm  Liver:  Shadowing echogenic focus within the central right lobe of liver is identified which may represent an area of calcification. Portal vein is patent on color Doppler imaging with normal direction of blood flow towards the liver.  IMPRESSION: 1. Gallstone. 2. No gallbladder wall thickening and negative sonographic Murphy's sign.   Electronically Signed By: Kerby Moors M.D. On: 06/15/2017 18:53.  The patient is a 61 year old female.   Past Surgical History (Tanisha A. Owens Shark, [place]; 06/18/2017 11:31 AM) Breast Augmentation Bilateral. Oral Surgery Thyroid Surgery  Diagnostic Studies History (Tanisha A. Owens Shark, [place]; 06/18/2017 11:31 AM) Colonoscopy 1-5 years ago Mammogram 1-3 years ago Pap Smear 1-5 years ago  Allergies (Tanisha A. Owens Shark, [place]; 06/18/2017 11:32 AM) No Known Drug Allergies 06/18/2017 Allergies Reconciled  Medication History (Tanisha A. Owens Shark, [place]; 06/18/2017 11:33 AM) Vitamin D (Ergocalciferol) (50000UNIT Capsule, Oral) Active. LORazepam (1MG  Tablet, Oral) Active. Carafate (1GM/10ML Suspension, Oral) Active. Hyoscyamine Sulfate (0.125MG  Tab Sublingual, Sublingual) Active. Ondansetron (4MG  Tablet Disint, Oral) Active. Ibuprofen (200MG  Capsule, Oral) Active. Medications Reconciled  Social History (Tanisha A. Owens Shark, [place]; 06/18/2017 11:31 AM) Alcohol use Occasional alcohol use. Caffeine use Carbonated beverages, Tea. No drug use Tobacco use Never smoker.  Family History (Tanisha A. Owens Shark, [place]; 06/18/2017 11:31 AM) Arthritis Mother. Breast Cancer Mother. Colon Cancer Father. Depression Mother. Heart Disease Mother. Migraine Headache Mother. Thyroid problems Mother.  Pregnancy / Birth History (Tanisha A. Owens Shark, [place]; 06/18/2017 11:31 AM) Age at  menarche 46 years. Gravida 0 Para 0  Other Problems (Tanisha A. Owens Shark, [place]; 06/18/2017 11:31 AM) Arthritis Back Pain Cholelithiasis Other disease, cancer, significant illness Thyroid Disease     Review of Systems (Tanisha A. Brown RMA; 06/18/2017 11:31 AM) General Present- Appetite Loss, Chills, Fatigue and Fever. Not Present- Night Sweats, Weight Gain and Weight Loss. Skin Not Present- Change in Wart/Mole, Dryness, Hives, Jaundice, New Lesions, Non-Healing Wounds, Rash and Ulcer. HEENT Present- Earache, Visual Disturbances and Wears glasses/contact lenses. Not Present- Hearing Loss, Hoarseness, Nose Bleed, Oral Ulcers, Ringing in the Ears, Seasonal Allergies, Sinus Pain, Sore Throat and Yellow Eyes. Respiratory Present- Snoring. Not Present- Bloody sputum, Chronic Cough, Difficulty Breathing and Wheezing. Breast Not Present- Breast Mass, Breast Pain, Nipple Discharge and Skin Changes. Cardiovascular Not Present- Chest Pain, Difficulty Breathing Lying Down, Leg Cramps, Palpitations, Rapid Heart Rate, Shortness of Breath and Swelling of Extremities. Gastrointestinal Present- Abdominal Pain, Bloating, Nausea and Vomiting. Not Present- Bloody Stool, Change in Bowel Habits, Chronic diarrhea, Constipation, Difficulty Swallowing, Excessive gas, Gets full quickly at meals, Hemorrhoids, Indigestion and Rectal Pain. Female Genitourinary Not Present- Frequency, Nocturia, Painful Urination, Pelvic Pain and Urgency. Musculoskeletal Present- Joint Stiffness and Muscle Pain. Not Present- Back Pain, Joint Pain, Muscle Weakness and Swelling of Extremities. Neurological Present- Headaches and Weakness. Not Present- Decreased Memory, Fainting, Numbness, Seizures, Tingling, Tremor  and Trouble walking. Psychiatric Not Present- Anxiety, Bipolar, Change in Sleep Pattern, Depression, Fearful and Frequent crying. Endocrine Not Present- Cold Intolerance, Excessive Hunger, Hair Changes, Heat Intolerance, Hot  flashes and New Diabetes. Hematology Present- Easy Bruising and Gland problems. Not Present- Blood Thinners, Excessive bleeding, HIV and Persistent Infections.  Vitals (Tanisha A. Brown RMA; 06/18/2017 11:32 AM) 06/18/2017 11:31 AM Weight: 214.6 lb Height: 67in Body Surface Area: 2.08 m Body Mass Index: 33.61 kg/m  Temp.: 97.2F  Pulse: 77 (Regular)  P.OX: 98% (Room air) BP: 126/84 (Sitting, Left Arm, Standard)      Physical Exam (Louise Rawson A. Gwendolyn Nishi MD; 06/18/2017 11:55 AM)  General Mental Status-Alert. General Appearance-Consistent with stated age. Hydration-Well hydrated. Voice-Normal.  Chest and Lung Exam Chest and lung exam reveals -quiet, even and easy respiratory effort with no use of accessory muscles and on auscultation, normal breath sounds, no adventitious sounds and normal vocal resonance. Inspection Chest Wall - Normal. Back - normal.  Cardiovascular Cardiovascular examination reveals -normal heart sounds, regular rate and rhythm with no murmurs and normal pedal pulses bilaterally.  Abdomen Note: mild TTP epigastrium negative Murphy's sign  Neurologic Neurologic evaluation reveals -alert and oriented x 3 with no impairment of recent or remote memory. Mental Status-Normal.  Musculoskeletal Normal Exam - Left-Upper Extremity Strength Normal and Lower Extremity Strength Normal. Normal Exam - Right-Upper Extremity Strength Normal and Lower Extremity Strength Normal.    Assessment & Plan (Zakry Caso A. Emaly Boschert MD; 06/18/2017 11:55 AM)  CHRONIC CHOLECYSTITIS WITH CALCULUS (K80.10) Impression: Patient has inability to eat or drink and has elevated liver function studies with gallstones. She does have epigastric tenderness. She will be admitted to [facility] for further workup which may include MRCP cholecystectomy.  ADMIT [facility] FOR FURTHER WORK UP

## 2017-06-18 NOTE — H&P (Deleted)
  The note originally documented on this encounter has been moved the the encounter in which it belongs.  

## 2017-06-19 ENCOUNTER — Encounter (HOSPITAL_COMMUNITY): Admission: AD | Disposition: A | Discharge status: Home / Self Care | Payer: Self-pay | Source: Ambulatory Visit

## 2017-06-19 ENCOUNTER — Inpatient Hospital Stay (HOSPITAL_COMMUNITY): Payer: BC Managed Care – PPO | Admitting: Anesthesiology

## 2017-06-19 ENCOUNTER — Encounter (HOSPITAL_COMMUNITY): Payer: Self-pay | Admitting: General Surgery

## 2017-06-19 ENCOUNTER — Inpatient Hospital Stay (HOSPITAL_COMMUNITY): Payer: BC Managed Care – PPO

## 2017-06-19 HISTORY — PX: CHOLECYSTECTOMY: SHX55

## 2017-06-19 LAB — URINALYSIS, ROUTINE W REFLEX MICROSCOPIC
BACTERIA UA: NONE SEEN
Bilirubin Urine: NEGATIVE
Glucose, UA: NEGATIVE mg/dL
HGB URINE DIPSTICK: NEGATIVE
Ketones, ur: 80 mg/dL — AB
Leukocytes, UA: NEGATIVE
Nitrite: NEGATIVE
PROTEIN: 30 mg/dL — AB
SPECIFIC GRAVITY, URINE: 1.024 (ref 1.005–1.030)
pH: 5 (ref 5.0–8.0)

## 2017-06-19 LAB — SURGICAL PCR SCREEN
MRSA, PCR: NEGATIVE
STAPHYLOCOCCUS AUREUS: NEGATIVE

## 2017-06-19 SURGERY — LAPAROSCOPIC CHOLECYSTECTOMY WITH INTRAOPERATIVE CHOLANGIOGRAM
Anesthesia: General | Site: Abdomen

## 2017-06-19 MED ORDER — BUPIVACAINE-EPINEPHRINE 0.25% -1:200000 IJ SOLN
INTRAMUSCULAR | Status: DC | PRN
  Administered 2017-06-19: 20 mL

## 2017-06-19 MED ORDER — PROPOFOL 10 MG/ML IV BOLUS
INTRAVENOUS | Status: DC | PRN
  Administered 2017-06-19: 150 mg via INTRAVENOUS

## 2017-06-19 MED ORDER — SCOPOLAMINE 1 MG/3DAYS TD PT72
MEDICATED_PATCH | TRANSDERMAL | Status: AC
  Filled 2017-06-19: qty 1

## 2017-06-19 MED ORDER — OXYCODONE HCL 5 MG/5ML PO SOLN
5.0000 mg | Freq: Once | ORAL | Status: DC | PRN
  Filled 2017-06-19: qty 5

## 2017-06-19 MED ORDER — HYDROMORPHONE HCL-NACL 0.5-0.9 MG/ML-% IV SOSY
PREFILLED_SYRINGE | INTRAVENOUS | Status: AC
  Filled 2017-06-19: qty 1

## 2017-06-19 MED ORDER — HYDROMORPHONE HCL-NACL 0.5-0.9 MG/ML-% IV SOSY
PREFILLED_SYRINGE | INTRAVENOUS | Status: AC
  Filled 2017-06-19: qty 2

## 2017-06-19 MED ORDER — IOPAMIDOL (ISOVUE-300) INJECTION 61%
INTRAVENOUS | Status: AC
  Filled 2017-06-19: qty 50

## 2017-06-19 MED ORDER — 0.9 % SODIUM CHLORIDE (POUR BTL) OPTIME
TOPICAL | Status: DC | PRN
  Administered 2017-06-19: 1000 mL

## 2017-06-19 MED ORDER — KCL IN DEXTROSE-NACL 20-5-0.45 MEQ/L-%-% IV SOLN
INTRAVENOUS | Status: DC
  Administered 2017-06-19 – 2017-06-20 (×2): via INTRAVENOUS
  Filled 2017-06-19 (×3): qty 1000

## 2017-06-19 MED ORDER — MIDAZOLAM HCL 2 MG/2ML IJ SOLN
INTRAMUSCULAR | Status: AC
  Filled 2017-06-19: qty 2

## 2017-06-19 MED ORDER — LACTATED RINGERS IR SOLN
Status: DC | PRN
  Administered 2017-06-19: 1000 mL

## 2017-06-19 MED ORDER — LACTATED RINGERS IV SOLN
INTRAVENOUS | Status: DC
  Administered 2017-06-19: 13:00:00 via INTRAVENOUS

## 2017-06-19 MED ORDER — PROMETHAZINE HCL 25 MG/ML IJ SOLN
INTRAMUSCULAR | Status: AC
  Filled 2017-06-19: qty 1

## 2017-06-19 MED ORDER — OXYCODONE HCL 5 MG PO TABS: 5.0000 mg | ORAL_TABLET | Freq: Once | ORAL | Status: DC | PRN

## 2017-06-19 MED ORDER — PROPOFOL 10 MG/ML IV BOLUS
INTRAVENOUS | Status: AC
  Filled 2017-06-19: qty 20

## 2017-06-19 MED ORDER — EPHEDRINE SULFATE 50 MG/ML IJ SOLN
INTRAMUSCULAR | Status: DC | PRN
  Administered 2017-06-19 (×2): 10 mg via INTRAVENOUS

## 2017-06-19 MED ORDER — MEPERIDINE HCL 50 MG/ML IJ SOLN
6.2500 mg | INTRAMUSCULAR | Status: DC | PRN
Start: 2017-06-19 — End: 2017-06-19

## 2017-06-19 MED ORDER — ROCURONIUM BROMIDE 10 MG/ML (PF) SYRINGE
PREFILLED_SYRINGE | INTRAVENOUS | Status: DC | PRN
  Administered 2017-06-19: 10 mg via INTRAVENOUS
  Administered 2017-06-19: 40 mg via INTRAVENOUS
  Administered 2017-06-19: 10 mg via INTRAVENOUS

## 2017-06-19 MED ORDER — SUCCINYLCHOLINE CHLORIDE 200 MG/10ML IV SOSY
PREFILLED_SYRINGE | INTRAVENOUS | Status: DC | PRN
Start: 2017-06-19 — End: 2017-06-19
  Administered 2017-06-19: 120 mg via INTRAVENOUS

## 2017-06-19 MED ORDER — BUPIVACAINE-EPINEPHRINE (PF) 0.25% -1:200000 IJ SOLN
INTRAMUSCULAR | Status: AC
  Filled 2017-06-19: qty 30

## 2017-06-19 MED ORDER — SUGAMMADEX SODIUM 200 MG/2ML IV SOLN
INTRAVENOUS | Status: DC | PRN
  Administered 2017-06-19: 200 mg via INTRAVENOUS

## 2017-06-19 MED ORDER — LIDOCAINE 2% (20 MG/ML) 5 ML SYRINGE
INTRAMUSCULAR | Status: DC | PRN
  Administered 2017-06-19: 100 mg via INTRAVENOUS

## 2017-06-19 MED ORDER — PROMETHAZINE HCL 25 MG/ML IJ SOLN
6.2500 mg | INTRAMUSCULAR | Status: DC | PRN
  Administered 2017-06-19: 6.25 mg via INTRAVENOUS

## 2017-06-19 MED ORDER — HYDROMORPHONE HCL-NACL 0.5-0.9 MG/ML-% IV SOSY
0.2500 mg | PREFILLED_SYRINGE | INTRAVENOUS | Status: DC | PRN
  Administered 2017-06-19 (×6): 0.25 mg via INTRAVENOUS

## 2017-06-19 MED ORDER — IOPAMIDOL (ISOVUE-300) INJECTION 61%
INTRAVENOUS | Status: DC | PRN
  Administered 2017-06-19: 2.5 mL

## 2017-06-19 MED ORDER — SCOPOLAMINE 1 MG/3DAYS TD PT72
MEDICATED_PATCH | TRANSDERMAL | Status: DC | PRN
  Administered 2017-06-19: 1 via TRANSDERMAL

## 2017-06-19 MED ORDER — FENTANYL CITRATE (PF) 250 MCG/5ML IJ SOLN
INTRAMUSCULAR | Status: AC
  Filled 2017-06-19: qty 5

## 2017-06-19 MED ORDER — POTASSIUM CHLORIDE 2 MEQ/ML IV SOLN: INTRAVENOUS | Status: DC

## 2017-06-19 MED ORDER — SUGAMMADEX SODIUM 200 MG/2ML IV SOLN
INTRAVENOUS | Status: AC
  Filled 2017-06-19: qty 2

## 2017-06-19 MED ORDER — MIDAZOLAM HCL 5 MG/5ML IJ SOLN
INTRAMUSCULAR | Status: DC | PRN
  Administered 2017-06-19: 2 mg via INTRAVENOUS

## 2017-06-19 SURGICAL SUPPLY — 43 items
APPLIER CLIP ROT 10 11.4 M/L (STAPLE)
BANDAGE ADH SHEER 1  50/CT (GAUZE/BANDAGES/DRESSINGS) ×12 IMPLANT
BENZOIN TINCTURE PRP APPL 2/3 (GAUZE/BANDAGES/DRESSINGS) ×3 IMPLANT
CABLE HIGH FREQUENCY MONO STRZ (ELECTRODE) ×3 IMPLANT
CATH CHOLANG 76X19 KUMAR (CATHETERS) ×3 IMPLANT
CHLORAPREP W/TINT 26ML (MISCELLANEOUS) ×3 IMPLANT
CLIP APPLIE ROT 10 11.4 M/L (STAPLE) IMPLANT
CLIP VESOLOCK LG 6/CT PURPLE (CLIP) IMPLANT
CLIP VESOLOCK XL 6/CT (CLIP) IMPLANT
CLOSURE WOUND 1/2 X4 (GAUZE/BANDAGES/DRESSINGS) ×1
COVER MAYO STAND STRL (DRAPES) ×3 IMPLANT
COVER SURGICAL LIGHT HANDLE (MISCELLANEOUS) ×3 IMPLANT
DRAIN CHANNEL 19F RND (DRAIN) ×3 IMPLANT
DRAPE C-ARM 42X120 X-RAY (DRAPES) ×3 IMPLANT
EVACUATOR SILICONE 100CC (DRAIN) ×3 IMPLANT
GLOVE BIOGEL PI IND STRL 7.0 (GLOVE) ×1 IMPLANT
GLOVE BIOGEL PI INDICATOR 7.0 (GLOVE) ×2
GLOVE SURG SS PI 7.0 STRL IVOR (GLOVE) ×3 IMPLANT
GOWN STRL REUS W/TWL LRG LVL3 (GOWN DISPOSABLE) ×6 IMPLANT
GOWN STRL REUS W/TWL XL LVL3 (GOWN DISPOSABLE) ×6 IMPLANT
GRASPER SUT TROCAR 14GX15 (MISCELLANEOUS) ×3 IMPLANT
KIT BASIN OR (CUSTOM PROCEDURE TRAY) ×3 IMPLANT
POUCH RETRIEVAL ECOSAC 10 (ENDOMECHANICALS) ×1 IMPLANT
POUCH RETRIEVAL ECOSAC 10MM (ENDOMECHANICALS) ×2
SCISSORS LAP 5X35 DISP (ENDOMECHANICALS) ×3 IMPLANT
SET CHOLANGIOGRAPH MIX (MISCELLANEOUS) ×3 IMPLANT
SET IRRIG TUBING LAPAROSCOPIC (IRRIGATION / IRRIGATOR) ×3 IMPLANT
SHEARS HARMONIC ACE PLUS 36CM (ENDOMECHANICALS) IMPLANT
SLEEVE XCEL OPT CAN 5 100 (ENDOMECHANICALS) ×6 IMPLANT
STOPCOCK 4 WAY LG BORE MALE ST (IV SETS) ×3 IMPLANT
STRIP CLOSURE SKIN 1/2X4 (GAUZE/BANDAGES/DRESSINGS) ×2 IMPLANT
SUT ETHILON 2 0 PS N (SUTURE) IMPLANT
SUT ETHILON 3 0 PS 1 (SUTURE) ×3 IMPLANT
SUT MNCRL AB 4-0 PS2 18 (SUTURE) ×3 IMPLANT
SUT VIC AB 3-0 SH 27 (SUTURE) ×15
SUT VIC AB 3-0 SH 27XBRD (SUTURE) ×5 IMPLANT
SUT VICRYL 0 ENDOLOOP (SUTURE) IMPLANT
TOWEL OR 17X26 10 PK STRL BLUE (TOWEL DISPOSABLE) ×3 IMPLANT
TOWEL OR NON WOVEN STRL DISP B (DISPOSABLE) IMPLANT
TRAY LAPAROSCOPIC (CUSTOM PROCEDURE TRAY) ×3 IMPLANT
TROCAR BLADELESS OPT 5 100 (ENDOMECHANICALS) ×3 IMPLANT
TROCAR XCEL 12X100 BLDLESS (ENDOMECHANICALS) ×3 IMPLANT
TUBING INSUF HEATED (TUBING) ×3 IMPLANT

## 2017-06-19 NOTE — Transfer of Care (Signed)
Immediate Anesthesia Transfer of Care Note  Patient: MISHELLE HASSAN  Procedure(s) Performed: Procedure(s): LAPAROSCOPIC CHOLECYSTECTOMY WITH INTRAOPERATIVE CHOLANGIOGRAM,REPAIR OF DUODENAL INJURY (N/A)  Patient Location: PACU  Anesthesia Type:General  Level of Consciousness: awake, alert  and oriented  Airway & Oxygen Therapy: Patient Spontanous Breathing and Patient connected to face mask oxygen  Post-op Assessment: Report given to RN and Post -op Vital signs reviewed and stable  Post vital signs: Reviewed and stable  Last Vitals:  Vitals:   06/18/17 2116 06/19/17 0500  BP: 134/63 (!) 122/45  Pulse: 68 72  Resp: 18 16  Temp: 37.2 C 37.7 C  SpO2: 100% 95%    Last Pain:  Vitals:   06/19/17 1200  TempSrc:   PainSc: 0-No pain      Patients Stated Pain Goal: 2 (72/09/47 0962)  Complications: No apparent anesthesia complications

## 2017-06-19 NOTE — Anesthesia Preprocedure Evaluation (Signed)
Anesthesia Evaluation  Patient identified by MRN, date of birth, ID band Patient awake    Reviewed: Allergy & Precautions, NPO status , Patient's Chart, lab work & pertinent test results  Airway Mallampati: II  TM Distance: >3 FB Neck ROM: Full    Dental no notable dental hx.    Pulmonary neg pulmonary ROS,    Pulmonary exam normal breath sounds clear to auscultation       Cardiovascular negative cardio ROS Normal cardiovascular exam Rhythm:Regular Rate:Normal     Neuro/Psych  Headaches, Anxiety negative neurological ROS  negative psych ROS   GI/Hepatic negative GI ROS, Neg liver ROS,   Endo/Other  negative endocrine ROSHypothyroidism   Renal/GU negative Renal ROS  negative genitourinary   Musculoskeletal negative musculoskeletal ROS (+) Arthritis , Fibromyalgia -  Abdominal   Peds negative pediatric ROS (+)  Hematology negative hematology ROS (+)   Anesthesia Other Findings Cholecystitis  Reproductive/Obstetrics negative OB ROS                             Anesthesia Physical Anesthesia Plan  ASA: III  Anesthesia Plan: General   Post-op Pain Management:    Induction: Intravenous  PONV Risk Score and Plan: 3 and Ondansetron, Dexamethasone and Midazolam  Airway Management Planned: Oral ETT  Additional Equipment:   Intra-op Plan:   Post-operative Plan: Extubation in OR  Informed Consent: I have reviewed the patients History and Physical, chart, labs and discussed the procedure including the risks, benefits and alternatives for the proposed anesthesia with the patient or authorized representative who has indicated his/her understanding and acceptance.   Dental advisory given  Plan Discussed with: CRNA  Anesthesia Plan Comments:         Anesthesia Quick Evaluation

## 2017-06-19 NOTE — Op Note (Addendum)
PATIENT:  Patricia Greene  61 y.o. female  PRE-OPERATIVE DIAGNOSIS:  cholelithiasis  POST-OPERATIVE DIAGNOSIS:  cholelithiasis  PROCEDURE:  Procedure(s): LAPAROSCOPIC CHOLECYSTECTOMY WITH INTRAOPERATIVE CHOLANGIOGRAM,REPAIR OF DUODENAL INJURY   SURGEON:  Surgeon(s): Sal Spratley, Arta Bruce, MD   ASSISTANT: Greer Pickerel was present for the critical portion of the duodenum repair for decision making and assistance on difficult maneuvering of the tissues  ANESTHESIA:   local and general  Indications for procedure: Patricia Greene is a 61 y.o. female with symptoms of Abdominal pain and Nausea and vomiting consistent with gallbladder disease, Confirmed by Ultrasound.  Description of procedure: The patient was brought into the operative suite, placed supine. Anesthesia was administered with endotracheal tube. Patient was strapped in place and foot board was secured. All pressure points were offloaded by foam padding. The patient was prepped and draped in the usual sterile fashion.  A small incision was made to the right of the umbilicus. A 20mm trocar was inserted into the peritoneal cavity with optical entry. Pneumoperitoneum was applied with high flow low pressure. 2 4mm trocars were placed in the RUQ. A 62mm trocar was placed in the subxiphoid space. All trocars sites were first anesthesized with 0.25% marcaine with epinephrine in the subcutaneous and preperitoneal layers. Next the patient was placed in reverse trendelenberg. The gallbladder was contracted and the body of the gallbladder was adhered to the duodenum. These adhesions were taken down with scissors. In doing this, the gallbladder began draining some bile. Plan to review the duodenum after cholecystectomy was made.  The gallbladder was retracted cephalad and lateral. The peritoneum was reflected off the infundibulum working lateral to medial. "The cystic duct and cystic artery were identified and further dissection revealed a critical  view, due to concern for choledocholithiasis a cholangiogram was performed with ductotomy and cook catheter passed through a separate subcostal stab incision. Normal ductal anatomy was visualized. The cystic duct and cystic artery were doubly clipped and ligated.   The gallbladder was removed off the liver bed with cautery. The Gallbladder was placed in a specimen bag. The gallbladder fossa was irrigated and hemostasis was applied with cautery. The gallbladder was removed via the 48mm trocar.  Attention was turned to the duodenum. Dr. Redmond Pulling was able to come in assist for this portion of the case. There was some golden fluid coming from the area that had been dissected off the gallbladder and on inspect there was a full thickness injury on the anterior surface and a small adjacent serosal injury. Multiple 3-0 vicryl interrupted sutures were used to repair the injury. The area appeared air tight at the end of the repair. A appendage of fat from the transverse colon was sutured over the repair in a patch style.   No dilation was required for removal, therefore no fascial closure was performed. Pneumoperitoneum was removed, all trocar were removed. All incisions were closed with 4-0 monocryl subcuticular stitch. The patient woke from anesthesia and was brought to PACU in stable condition. All counts were correct  Findings: inflamed contracted gallbladder  Specimen: gallbladder  Blood loss: Total I/O In: 500 [I.V.:500] Out: 400 [Urine:400] ml  Local anesthesia: 20 ml 8.1% marcaine  Complications: injury to duodenum requiring suture repair  PLAN OF CARE: Admit to inpatient   PATIENT DISPOSITION:  PACU - hemodynamically stable.  [name], M.D. General, Bariatric, & Minimally Invasive Surgery [facility]

## 2017-06-19 NOTE — Anesthesia Procedure Notes (Signed)
Procedure Name: Intubation Date/Time: 06/19/2017 1:48 PM Performed by: Lind Covert Pre-anesthesia Checklist: Patient identified, Emergency Drugs available, Suction available and Patient being monitored Patient Re-evaluated:Patient Re-evaluated prior to induction Oxygen Delivery Method: Circle system utilized Preoxygenation: Pre-oxygenation with 100% oxygen Induction Type: IV induction Ventilation: Mask ventilation without difficulty Laryngoscope Size: Miller and 2 Grade View: Grade II Tube type: Oral Tube size: 7.5 mm Number of attempts: 1 Airway Equipment and Method: Stylet and Bougie stylet Placement Confirmation: ETT inserted through vocal cords under direct vision,  positive ETCO2 and breath sounds checked- equal and bilateral Secured at: 22 cm Tube secured with: Tape Dental Injury: Teeth and Oropharynx as per pre-operative assessment

## 2017-06-19 NOTE — Discharge Instructions (Signed)
Cholecystostomy, Care After Refer to this sheet in the next few weeks. These instructions provide you with information about caring for yourself after your procedure. Your health care provider may also give you more specific instructions. Your treatment has been planned according to current medical practices, but problems sometimes occur. Call your health care provider if you have any problems or questions after your procedure. What can I expect after the procedure? After your procedure, it is common to have soreness near the site of your drainage tube (catheter) or your incision. Follow these instructions at home: Incision care  Follow instructions from your health care provider about how to take care of your incision. Make sure you: ? Wash your hands with soap and water before you change your bandage (dressing). If soap and water are not available, use hand sanitizer. ? Change your dressing as told by your health care provider. ? Leave stitches (sutures), skin glue, or adhesive strips in place. These skin closures may need to be in place for 2 weeks or longer. If adhesive strip edges start to loosen and curl up, you may trim the loose edges. Do not remove adhesive strips completely unless your health care provider tells you to do that.  Check your incision and your drainage site every day for signs of infection. Watch for: ? Redness, swelling, or pain. ? Fluid, blood, or pus. General instructions  If you were sent home with a surgical drain in place, follow instructions from your health care provider about how to care for your drain and collection bag at home.  Do not take baths, swim, or use a hot tub until your health care provider approves. Ask your health care provider if you can take showers. You may only be allowed to take sponge baths for bathing.  Follow instructions from your health care provider about what you may eat or drink.  Take over-the-counter and prescription medicines only  as told by your health care provider.  Keep all follow-up visits as told by your health care provider. This is important. Contact a health care provider if:  You have redness, swelling, or pain at your incision or drainage site.  You have nausea or vomiting. Get help right away if:  Your abdominal pain gets worse.  You feel dizzy or you faint while standing.  You have fluid, blood, or pus coming from your incision or drainage site.  You have a fever.  You have shortness of breath.  You have a rapid heartbeat.  Your nausea or vomiting does not go away.  Your drainage tube becomes blocked.  Your drainage tube comes out of your abdomen. This information is not intended to replace advice given to you by your health care provider. Make sure you discuss any questions you have with your health care provider. Document Released: 06/09/2015 Document Revised: 02/24/2016 Document Reviewed: 12/30/2014 Elsevier Interactive Patient Education  2018 [place] [employer] LAPAROSCOPIC SURGERY: POST OP INSTRUCTIONS Always review your discharge instruction sheet given to you by the facility where your surgery was performed. IF YOU HAVE DISABILITY OR FAMILY LEAVE FORMS, YOU MUST BRING THEM TO THE OFFICE FOR PROCESSING.   DO NOT GIVE THEM TO YOUR DOCTOR.  1. A prescription for pain medication may be given to you upon discharge.  Take your pain medication as prescribed, if needed.  If narcotic pain medicine is not needed, then you may take acetaminophen (Tylenol) or ibuprofen (Advil) as needed. 2. Take your usually prescribed medications  unless otherwise directed. 3. If you need a refill on your pain medication, please contact your pharmacy.  They will contact our office to request authorization. Prescriptions will not be filled after 5pm or on week-ends. 4. You should follow a light diet the first few days after arrival home, such as soup and crackers, etc.  Be  sure to include lots of fluids daily. 5. Most patients will experience some swelling and bruising in the area of the incisions.  Ice packs will help.  Swelling and bruising can take several days to resolve.  6. It is common to experience some constipation if taking pain medication after surgery.  Increasing fluid intake and taking a stool softener (such as Colace) will usually help or prevent this problem from occurring.  A mild laxative (Milk of Magnesia or Miralax) should be taken according to package instructions if there are no bowel movements after 48 hours. 7. Unless discharge instructions indicate otherwise, you may remove your bandages 24-48 hours after surgery, and you may shower at that time.  You may have steri-strips (small skin tapes) in place directly over the incision.  These strips should be left on the skin for 7-10 days.  If your surgeon used skin glue on the incision, you may shower in 24 hours.  The glue will flake off over the next 2-3 weeks.  Any sutures or staples will be removed at the office during your follow-up visit. 8. ACTIVITIES:  You may resume regular (light) daily activities beginning the next day--such as daily self-care, walking, climbing stairs--gradually increasing activities as tolerated.  You may have sexual intercourse when it is comfortable.  Refrain from any heavy lifting or straining until approved by your doctor. a. You may drive when you are no longer taking prescription pain medication, you can comfortably wear a seatbelt, and you can safely maneuver your car and apply brakes. b. RETURN TO WORK:  __________________________________________________________ 9. You should see your doctor in the office for a follow-up appointment approximately 2-3 weeks after your surgery.  Make sure that you call for this appointment within a day or two after you arrive home to insure a convenient appointment time. 10. OTHER INSTRUCTIONS:  __________________________________________________________________________________________________________________________ __________________________________________________________________________________________________________________________ WHEN TO CALL YOUR DOCTOR: 1. Fever over 101.0 2. Inability to urinate 3. Continued bleeding from incision. 4. Increased pain, redness, or drainage from the incision. 5. Increasing abdominal pain  The clinic staff is available to answer your questions during regular business hours.  Please dont hesitate to call and ask to speak to one of the nurses for clinical concerns.  If you have a medical emergency, go to the nearest emergency room or call 911.  A surgeon from [facility] is always on call at the hospital. [address], [place], [place], [place]  [address] ? [address], [place], [place]   [address] [phone] ? [phone] ? FAX [phone] Web site: [email or web address]

## 2017-06-19 NOTE — Progress Notes (Signed)
  Progress Note: General Surgery Service   Assessment/Plan: Patient Active Problem List   Diagnosis Date Noted  . Chronic cholecystitis 06/18/2017  . Epigastric pain 06/15/2017  . Lymphadenopathy of right cervical region 04/06/2017  . Closed wedge compression fracture of T11 vertebra ([place]) 03/15/2017  . Chest tightness 03/14/2017  . Reactive airway disease with wheezing 03/14/2017  . Acute bronchitis 03/14/2017  . Sinus pain 03/06/2017  . Back muscle spasm 10/04/2016  . Cough 09/26/2016  . Abnormal MRI, breast 04/20/2016  . Breast pain, left 04/15/2016  . H/O breast implant 04/15/2016  . Polyarthralgia 10/18/2015  . Osteopenia 02/01/2015  . Allergic rhinitis, cause unspecified 03/11/2011  . Vitamin D deficiency 03/13/2010  . HYPERCHOLESTEROLEMIA, BORDERLINE 03/13/2010  . MIGRAINE HEADACHE 03/13/2010  . CAROTID BRUIT 03/13/2010  . Hypothyroidism 03/09/2010  . INSOMNIA, CHRONIC 02/20/2008  . ANXIETY 09/06/2007  . FIBROMYALGIA 09/06/2007  . IRRITABLE BOWEL SYNDROME, HX OF 09/06/2007   Chronic cholecystitis with likely recent choledocholithiasis now resolved by lab change assumption s/p Procedure(s): LAPAROSCOPIC CHOLECYSTECTOMY WITH INTRAOPERATIVE CHOLANGIOGRAM 06/19/2017 -LFTs improved from Friday -lap chole w ioc today -We discussed the etiology of her pain, we discussed treatment options and recommended surgery. We discussed details of surgery including general anesthesia, laparoscopic approach, identification of cystic duct and common bile duct. Ligation of cystic duct and cystic artery. Possible need for intraoperative cholangiogram or open procedure. Possible risks of common bile duct injury, liver injury, cystic duct leak, bleeding, infection, post-cholecystectomy syndrome. The patient showed good understanding and all questions were answered    LOS: 1 day  Chief Complaint/Subjective: Pain new in right side, some nausea overnight  Objective: Vital signs in last 24  hours: Temp:  [98.8 F (37.1 C)-99.8 F (37.7 C)] 99.8 F (37.7 C) (09/18 0500) Pulse Rate:  [68-72] 72 (09/18 0500) Resp:  [16-20] 16 (09/18 0500) BP: (122-134)/(45-82) 122/45 (09/18 0500) SpO2:  [95 %-100 %] 95 % (09/18 0500) Last BM Date: 06/18/17  Intake/Output from previous day: 09/17 0701 - 09/18 0700 In: 1250 [I.V.:1200; IV Piggyback:50] Out: -  Intake/Output this shift: No intake/output data recorded.  Lungs: CTAB  Cardiovascular: RRR  Abd: soft, TTP RUQ, no guarding, neg murphy's  Extremities: no edema  Neuro: AOx4  Lab Results: CBC   Recent Labs  06/18/17 1905  WBC 8.3  HGB 12.2  HCT 36.3  PLT 221   BMET  Recent Labs  06/18/17 1905  NA 137  K 3.0*  CL 102  CO2 23  GLUCOSE 98  BUN 9  CREATININE 0.92  CALCIUM 9.0   PT/INR No results for input(s): LABPROT, INR in the last 72 hours. ABG No results for input(s): PHART, HCO3 in the last 72 hours.  Invalid input(s): PCO2, PO2  Studies/Results:  Anti-infectives: Anti-infectives    Start     Dose/Rate Route Frequency Ordered Stop   06/18/17 2000  cefTRIAXone (ROCEPHIN) 2 g in dextrose 5 % 50 mL IVPB     2 g 100 mL/hr over 30 Minutes Intravenous Every 24 hours 06/18/17 1847        Medications: Scheduled Meds: . enoxaparin (LOVENOX) injection  40 mg Subcutaneous Q24H   Continuous Infusions: . cefTRIAXone (ROCEPHIN)  IV Stopped (06/18/17 2216)  . dextrose 5 % and 0.9% NaCl 125 mL/hr at 06/18/17 2024   PRN Meds:.fentaNYL (SUBLIMAZE) injection, ondansetron **OR** ondansetron (ZOFRAN) IV, promethazine  Mickeal Skinner, MD Pg# [phone] [facility]

## 2017-06-19 NOTE — Anesthesia Postprocedure Evaluation (Signed)
Anesthesia Post Note  Patient: Patricia Greene  Procedure(s) Performed: Procedure(s) (LRB): LAPAROSCOPIC CHOLECYSTECTOMY WITH INTRAOPERATIVE CHOLANGIOGRAM,REPAIR OF DUODENAL INJURY (N/A)     Patient location during evaluation: PACU Anesthesia Type: General Level of consciousness: awake and alert Pain management: pain level controlled Vital Signs Assessment: post-procedure vital signs reviewed and stable Respiratory status: spontaneous breathing, nonlabored ventilation, respiratory function stable and patient connected to nasal cannula oxygen Cardiovascular status: blood pressure returned to baseline and stable Postop Assessment: no apparent nausea or vomiting Anesthetic complications: no    Last Vitals:  Vitals:   06/19/17 1800 06/19/17 1854  BP: (!) 107/55 104/76  Pulse: 61 67  Resp: 14 17  Temp: 37 C 36.6 C  SpO2: 95% 95%    Last Pain:  Vitals:   06/19/17 1854  TempSrc: Oral  PainSc:                  Montez Hageman

## 2017-06-19 NOTE — Progress Notes (Signed)
Paged on call MD re: below note for fluid order clarification. Received call back from Dr. Hassell Done. Discussed current fluid orders and pt information. Received verbal order to d/c current fluid orders and place new fluid order for D5 0.5 NS w/20KCL @ 75 cc/hr. Orders placed.

## 2017-06-19 NOTE — Progress Notes (Signed)
Paged Dr. Kieth Brightly to clarify fluid orders.

## 2017-06-20 ENCOUNTER — Inpatient Hospital Stay (HOSPITAL_COMMUNITY): Payer: BC Managed Care – PPO

## 2017-06-20 ENCOUNTER — Encounter (HOSPITAL_COMMUNITY): Payer: Self-pay | Admitting: *Deleted

## 2017-06-20 LAB — CBC
HEMATOCRIT: 30.3 % — AB (ref 36.0–46.0)
Hemoglobin: 10.1 g/dL — ABNORMAL LOW (ref 12.0–15.0)
MCH: 30.4 pg (ref 26.0–34.0)
MCHC: 33.3 g/dL (ref 30.0–36.0)
MCV: 91.3 fL (ref 78.0–100.0)
Platelets: 184 10*3/uL (ref 150–400)
RBC: 3.32 MIL/uL — ABNORMAL LOW (ref 3.87–5.11)
RDW: 14.7 % (ref 11.5–15.5)
WBC: 6.2 10*3/uL (ref 4.0–10.5)

## 2017-06-20 LAB — COMPREHENSIVE METABOLIC PANEL
ALBUMIN: 3 g/dL — AB (ref 3.5–5.0)
ALT: 134 U/L — ABNORMAL HIGH (ref 14–54)
AST: 44 U/L — AB (ref 15–41)
Alkaline Phosphatase: 116 U/L (ref 38–126)
Anion gap: 8 (ref 5–15)
CHLORIDE: 104 mmol/L (ref 101–111)
CO2: 27 mmol/L (ref 22–32)
Calcium: 8.1 mg/dL — ABNORMAL LOW (ref 8.9–10.3)
Creatinine, Ser: 0.7 mg/dL (ref 0.44–1.00)
GFR calc Af Amer: >60 mL/min (ref >60)
GLUCOSE: 126 mg/dL — AB (ref 65–99)
POTASSIUM: 3.2 mmol/L — AB (ref 3.5–5.1)
SODIUM: 139 mmol/L (ref 135–145)
Total Bilirubin: 0.7 mg/dL (ref 0.3–1.2)
Total Protein: 5.6 g/dL — ABNORMAL LOW (ref 6.5–8.1)

## 2017-06-20 MED ORDER — IOPAMIDOL (ISOVUE-300) INJECTION 61%
150.0000 mL | Freq: Once | INTRAVENOUS | Status: AC | PRN
  Administered 2017-06-20: 150 mL via ORAL

## 2017-06-20 MED ORDER — HYDROMORPHONE HCL 1 MG/ML IJ SOLN
0.5000 mg | INTRAMUSCULAR | Status: DC | PRN
  Administered 2017-06-20 – 2017-06-22 (×8): 0.5 mg via INTRAVENOUS
  Filled 2017-06-20 (×10): qty 1

## 2017-06-20 MED ORDER — OXYCODONE HCL 5 MG PO TABS
5.0000 mg | ORAL_TABLET | Freq: Four times a day (QID) | ORAL | Status: DC | PRN
  Administered 2017-06-20 – 2017-06-22 (×5): 5 mg via ORAL
  Filled 2017-06-20 (×5): qty 1

## 2017-06-20 MED ORDER — KETOROLAC TROMETHAMINE 15 MG/ML IJ SOLN
15.0000 mg | Freq: Three times a day (TID) | INTRAMUSCULAR | Status: DC | PRN
  Administered 2017-06-20 – 2017-06-22 (×4): 15 mg via INTRAVENOUS
  Filled 2017-06-20 (×4): qty 1

## 2017-06-20 MED ORDER — IOPAMIDOL (ISOVUE-300) INJECTION 61%
INTRAVENOUS | Status: AC
  Administered 2017-06-20: 150 mL via ORAL
  Filled 2017-06-20: qty 150

## 2017-06-20 MED ORDER — FENTANYL CITRATE (PF) 100 MCG/2ML IJ SOLN: 50.0000 ug | INTRAMUSCULAR | Status: DC | PRN

## 2017-06-20 NOTE — Progress Notes (Signed)
  Progress Note: General Surgery Service   Assessment/Plan: Patient Active Problem List   Diagnosis Date Noted  . Chronic cholecystitis 06/18/2017  . Epigastric pain 06/15/2017  . Lymphadenopathy of right cervical region 04/06/2017  . Closed wedge compression fracture of T11 vertebra ([place]) 03/15/2017  . Chest tightness 03/14/2017  . Reactive airway disease with wheezing 03/14/2017  . Acute bronchitis 03/14/2017  . Sinus pain 03/06/2017  . Back muscle spasm 10/04/2016  . Cough 09/26/2016  . Abnormal MRI, breast 04/20/2016  . Breast pain, left 04/15/2016  . H/O breast implant 04/15/2016  . Polyarthralgia 10/18/2015  . Osteopenia 02/01/2015  . Allergic rhinitis, cause unspecified 03/11/2011  . Vitamin D deficiency 03/13/2010  . HYPERCHOLESTEROLEMIA, BORDERLINE 03/13/2010  . MIGRAINE HEADACHE 03/13/2010  . CAROTID BRUIT 03/13/2010  . Hypothyroidism 03/09/2010  . INSOMNIA, CHRONIC 02/20/2008  . ANXIETY 09/06/2007  . FIBROMYALGIA 09/06/2007  . IRRITABLE BOWEL SYNDROME, HX OF 09/06/2007   s/p Procedure(s): LAPAROSCOPIC CHOLECYSTECTOMY WITH INTRAOPERATIVE CHOLANGIOGRAM,REPAIR OF DUODENAL INJURY 06/19/2017 Duodenal injury at time of gallbladder, more than expected pain this morning -NPO -UGI water soluble contrast -change pain meds    LOS: 2 days  Chief Complaint/Subjective: Terrible pain all night, fentanyl not holding over, no nausea  Objective: Vital signs in last 24 hours: Temp:  [97.3 F (36.3 C)-99.1 F (37.3 C)] 99 F (37.2 C) (09/19 0610) Pulse Rate:  [61-76] 62 (09/19 0610) Resp:  [7-20] 18 (09/19 0610) BP: (104-128)/(52-116) 113/56 (09/19 0610) SpO2:  [95 %-100 %] 98 % (09/19 0610) Last BM Date: 06/19/17  Intake/Output from previous day: 09/18 0701 - 09/19 0700 In: 2812.5 [P.O.:600; I.V.:2212.5] Out: 1020 [Urine:900; Drains:100; Blood:20] Intake/Output this shift: No intake/output data recorded.  Lungs: CTAB  Cardiovascular: RRR  Abd: soft, TTP RUQ,  no distension  Extremities: no edema  Neuro: AOx4  Lab Results: CBC   Recent Labs  06/18/17 1905 06/20/17 0522  WBC 8.3 6.2  HGB 12.2 10.1*  HCT 36.3 30.3*  PLT 221 184   BMET  Recent Labs  06/18/17 1905 06/20/17 0522  NA 137 139  K 3.0* 3.2*  CL 102 104  CO2 23 27  GLUCOSE 98 126*  BUN 9 <5*  CREATININE 0.92 0.70  CALCIUM 9.0 8.1*   PT/INR No results for input(s): LABPROT, INR in the last 72 hours. ABG No results for input(s): PHART, HCO3 in the last 72 hours.  Invalid input(s): PCO2, PO2  Studies/Results:  Anti-infectives: Anti-infectives    Start     Dose/Rate Route Frequency Ordered Stop   06/18/17 2000  cefTRIAXone (ROCEPHIN) 2 g in dextrose 5 % 50 mL IVPB  Status:  Discontinued     2 g 100 mL/hr over 30 Minutes Intravenous Every 24 hours 06/18/17 1847 06/19/17 1700      Medications: Scheduled Meds: . enoxaparin (LOVENOX) injection  40 mg Subcutaneous Q24H   Continuous Infusions: . dextrose 5 % and 0.45 % NaCl with KCl 20 mEq/L 75 mL/hr at 06/19/17 2030   PRN Meds:.fentaNYL (SUBLIMAZE) injection, HYDROmorphone (DILAUDID) injection, ketorolac, ondansetron **OR** ondansetron (ZOFRAN) IV, oxyCODONE, promethazine  Mickeal Skinner, MD Pg# [phone] [facility]

## 2017-06-21 NOTE — Progress Notes (Signed)
  Progress Note: General Surgery Service   Assessment/Plan: Patient Active Problem List   Diagnosis Date Noted  . Chronic cholecystitis 06/18/2017  . Epigastric pain 06/15/2017  . Lymphadenopathy of right cervical region 04/06/2017  . Closed wedge compression fracture of T11 vertebra ([place]) 03/15/2017  . Chest tightness 03/14/2017  . Reactive airway disease with wheezing 03/14/2017  . Acute bronchitis 03/14/2017  . Sinus pain 03/06/2017  . Back muscle spasm 10/04/2016  . Cough 09/26/2016  . Abnormal MRI, breast 04/20/2016  . Breast pain, left 04/15/2016  . H/O breast implant 04/15/2016  . Polyarthralgia 10/18/2015  . Osteopenia 02/01/2015  . Allergic rhinitis, cause unspecified 03/11/2011  . Vitamin D deficiency 03/13/2010  . HYPERCHOLESTEROLEMIA, BORDERLINE 03/13/2010  . MIGRAINE HEADACHE 03/13/2010  . CAROTID BRUIT 03/13/2010  . Hypothyroidism 03/09/2010  . INSOMNIA, CHRONIC 02/20/2008  . ANXIETY 09/06/2007  . FIBROMYALGIA 09/06/2007  . IRRITABLE BOWEL SYNDROME, HX OF 09/06/2007   s/p Procedure(s): LAPAROSCOPIC CHOLECYSTECTOMY WITH INTRAOPERATIVE CHOLANGIOGRAM,REPAIR OF DUODENAL INJURY 06/19/2017 -UGI negative for leak, drain with SS drainage -advance diet -ambulate -pain control    LOS: 3 days  Chief Complaint/Subjective: Some pain but improved, ambulating in hallways, some PO intake overnight  Objective: Vital signs in last 24 hours: Temp:  [98.8 F (37.1 C)-99.6 F (37.6 C)] 98.8 F (37.1 C) (09/20 0551) Pulse Rate:  [60-72] 62 (09/20 0551) Resp:  [14-18] 18 (09/20 0551) BP: (106-139)/(51-73) 106/51 (09/20 0551) SpO2:  [91 %-95 %] 95 % (09/20 0551) Last BM Date: 06/19/17  Intake/Output from previous day: 09/19 0701 - 09/20 0700 In: 1502.5 [I.V.:1502.5] Out: 1170 [Urine:1120; Drains:50] Intake/Output this shift: No intake/output data recorded.  Lungs: CTAB  Cardiovascular: RRR  Abd: soft, ATTP RUQ, ND  Extremities: no edema  Neuro: AOx4  Lab  Results: CBC   Recent Labs  06/18/17 1905 06/20/17 0522  WBC 8.3 6.2  HGB 12.2 10.1*  HCT 36.3 30.3*  PLT 221 184   BMET  Recent Labs  06/18/17 1905 06/20/17 0522  NA 137 139  K 3.0* 3.2*  CL 102 104  CO2 23 27  GLUCOSE 98 126*  BUN 9 <5*  CREATININE 0.92 0.70  CALCIUM 9.0 8.1*   PT/INR No results for input(s): LABPROT, INR in the last 72 hours. ABG No results for input(s): PHART, HCO3 in the last 72 hours.  Invalid input(s): PCO2, PO2  Studies/Results:  Anti-infectives: Anti-infectives    Start     Dose/Rate Route Frequency Ordered Stop   06/18/17 2000  cefTRIAXone (ROCEPHIN) 2 g in dextrose 5 % 50 mL IVPB  Status:  Discontinued     2 g 100 mL/hr over 30 Minutes Intravenous Every 24 hours 06/18/17 1847 06/19/17 1700      Medications: Scheduled Meds: . enoxaparin (LOVENOX) injection  40 mg Subcutaneous Q24H   Continuous Infusions: . dextrose 5 % and 0.45 % NaCl with KCl 20 mEq/L 75 mL/hr at 06/21/17 0600   PRN Meds:.fentaNYL (SUBLIMAZE) injection, HYDROmorphone (DILAUDID) injection, ketorolac, ondansetron **OR** ondansetron (ZOFRAN) IV, oxyCODONE, promethazine  Mickeal Skinner, MD Pg# [phone] [facility]

## 2017-06-22 MED ORDER — HYDROCODONE-ACETAMINOPHEN 5-325 MG PO TABS: 1.0000 | ORAL_TABLET | Freq: Four times a day (QID) | ORAL | 0 refills | Status: DC | PRN

## 2017-06-22 MED ORDER — IBUPROFEN 800 MG PO TABS: 800.0000 mg | ORAL_TABLET | Freq: Three times a day (TID) | ORAL | 0 refills | Status: DC | PRN

## 2017-06-22 MED ORDER — THYROID 60 MG PO TABS
120.0000 mg | ORAL_TABLET | Freq: Every day | ORAL | Status: DC
  Filled 2017-06-22: qty 1
  Filled 2017-06-22: qty 2

## 2017-06-22 NOTE — Discharge Summary (Signed)
Physician Discharge Summary  Patricia Greene VZC:[id number] DOB: 1956-04-23 DOA: 06/18/2017  PCP: Binnie Rail, MD  Admit date: 06/18/2017 Discharge date: 06/22/2017  Recommendations for Outpatient Follow-up:  1.  (include homehealth, outpatient follow-up instructions, specific recommendations for PCP to follow-up on, etc.)  Follow-up Information    Kinsinger, Arta Bruce, MD Follow up in 3 week(s).   Specialty:  General Surgery Contact information: [place] [address] [phone]          Discharge Diagnoses:  Active Problems:   Chronic cholecystitis   Surgical Procedure: lap chole with duodenal repair  Discharge Condition: Good Disposition: Home  Diet recommendation: low fat for 2 weeks then regular   Hospital Course:  61 yo female presented to office with concern for choledocholithiasis and was admitted to hospital. HD 1 she under lap chole with complication of duodenal injury and repair. POD 1 she underwent UGI with no leak and was advanced on diet. POD 3 she was discharged home and drain was removed.  Discharge Instructions  Discharge Instructions    Call MD for:  difficulty breathing, headache or visual disturbances    Complete by:  As directed    Call MD for:  hives    Complete by:  As directed    Call MD for:  persistant nausea and vomiting    Complete by:  As directed    Call MD for:  redness, tenderness, or signs of infection (pain, swelling, redness, odor or green/yellow discharge around incision site)    Complete by:  As directed    Call MD for:  severe uncontrolled pain    Complete by:  As directed    Call MD for:  temperature >100.4    Complete by:  As directed    Diet - low sodium heart healthy    Complete by:  As directed    Discharge wound care:    Complete by:  As directed    Ok to shower tomorrow. Glue will likely peel off in 1-3 weeks. No bandage required   Driving Restrictions    Complete by:  As directed     No driving while on narcotics   Increase activity slowly    Complete by:  As directed    Lifting restrictions    Complete by:  As directed    No lifting greater than 20 pounds for 3 weeks     Allergies as of 06/22/2017      Reactions   Silicone Other (See Comments)   Pt reports autoimmune diseases, fatigue, hair falling out      Medication List    TAKE these medications   albuterol 108 (90 Base) MCG/ACT inhaler Commonly known as:  PROVENTIL HFA;VENTOLIN HFA Inhale 2 puffs into the lungs every 6 (six) hours as needed for wheezing or shortness of breath.   baclofen 20 MG tablet Commonly known as:  LIORESAL Take 20 mg by mouth 4 (four) times daily as needed for muscle spasms.   CALCIUM PO Take 1 tablet by mouth 2 (two) times daily.   cefadroxil 500 MG capsule Commonly known as:  DURICEF cefadroxil 500 mg capsule   CENTRUM SILVER tablet Take 1 tablet by mouth daily.   fluconazole 150 MG tablet Commonly known as:  DIFLUCAN fluconazole 150 mg tablet   HYDROcodone-acetaminophen 5-325 MG tablet Commonly known as:  NORCO/VICODIN Take 1-2 tablets by mouth every 6 (six) hours as needed for moderate pain.   Hyoscyamine Sulfate  SL 0.125 MG Subl Commonly known as:  LEVSIN/SL Place 0.125 mg under the tongue every 4 (four) hours as needed. What changed:  reasons to take this   ibuprofen 800 MG tablet Commonly known as:  ADVIL,MOTRIN Take 1 tablet (800 mg total) by mouth every 8 (eight) hours as needed.   LORazepam 1 MG tablet Commonly known as:  ATIVAN TAKE 1 TABLET BY MOUTH TWICE DAILY AS NEEDED FOR ANXIETY   ondansetron 4 MG disintegrating tablet Commonly known as:  ZOFRAN ODT Take 1 tablet (4 mg total) by mouth every 8 (eight) hours as needed for nausea or vomiting.   ranitidine 150 MG tablet Commonly known as:  ZANTAC Take 150 mg by mouth 2 (two) times daily.   sucralfate 1 GM/10ML suspension Commonly known as:  CARAFATE Take 10 mLs (1 g total) by mouth 4  (four) times daily -  with meals and at bedtime.   thyroid 130 MG tablet Commonly known as:  ARMOUR Take 130 mg by mouth daily.   traMADol 50 MG tablet Commonly known as:  ULTRAM tramadol 50 mg tablet   traZODone 50 MG tablet Commonly known as:  DESYREL Take 0.5-1 tablets (25-50 mg total) by mouth at bedtime as needed for sleep.   Vitamin D (Ergocalciferol) 50000 units Caps capsule Commonly known as:  DRISDOL TAKE ONE CAPSULE BY MOUTH EVERY 7 DAYS            Discharge Care Instructions        Start     Ordered   06/22/17 0000  Diet - low sodium heart healthy     06/22/17 0736   06/22/17 0000  Increase activity slowly     06/22/17 0736   06/22/17 0000  Driving Restrictions    Comments:  No driving while on narcotics   06/22/17 0736   06/22/17 0000  Lifting restrictions    Comments:  No lifting greater than 20 pounds for 3 weeks   06/22/17 0736   06/22/17 0000  Discharge wound care:    Comments:  Ok to shower tomorrow. Glue will likely peel off in 1-3 weeks. No bandage required   06/22/17 0736   06/22/17 0000  Call MD for:  temperature >100.4     06/22/17 0736   06/22/17 0000  Call MD for:  persistant nausea and vomiting     06/22/17 0736   06/22/17 0000  Call MD for:  severe uncontrolled pain     06/22/17 0736   06/22/17 0000  Call MD for:  redness, tenderness, or signs of infection (pain, swelling, redness, odor or green/yellow discharge around incision site)     06/22/17 0736   06/22/17 0000  Call MD for:  difficulty breathing, headache or visual disturbances     06/22/17 0736   06/22/17 0000  Call MD for:  hives     06/22/17 0736   06/22/17 0000  ibuprofen (ADVIL,MOTRIN) 800 MG tablet  Every 8 hours PRN     06/22/17 0736   06/22/17 0000  HYDROcodone-acetaminophen (NORCO/VICODIN) 5-325 MG tablet  Every 6 hours PRN     06/22/17 0736     Follow-up Information    Kinsinger, Arta Bruce, MD Follow up in 3 week(s).   Specialty:  General Surgery Contact  information: [place] [address] [phone]            The results of significant diagnostics from this hospitalization (including imaging, microbiology, ancillary and laboratory) are listed  below for reference.    Significant Diagnostic Studies: Dg Cholangiogram Operative  Result Date: 06/19/2017 CLINICAL DATA:  Intraoperative cholangiogram during laparoscopic cholecystectomy. EXAM: INTRAOPERATIVE CHOLANGIOGRAM FLUOROSCOPY TIME:  16 seconds COMPARISON:  Right upper quadrant abdominal ultrasound - 06/15/2017 FINDINGS: Intraoperative cholangiographic images of the right upper abdominal quadrant during laparoscopic cholecystectomy are provided for review. Surgical clips overlie the expected location of the gallbladder fossa. Contrast injection demonstrates selective cannulation of the central aspect of the cystic duct. There is passage of contrast through the central aspect of the cystic duct with filling of a non dilated common bile duct. There is passage of contrast though the CBD and into the descending portion of the duodenum. There is minimal reflux of injected contrast into the common hepatic duct and central aspect of the non dilated intrahepatic biliary system. There are no discrete filling defects within the opacified portions of the biliary system to suggest the presence of choledocholithiasis. IMPRESSION: No evidence of choledocholithiasis. Electronically Signed   By: Sandi Mariscal M.D.   On: 06/19/2017 15:03   US Abdomen Limited  Result Date: 06/15/2017 CLINICAL DATA:  Right upper quadrant pain. EXAM: ULTRASOUND ABDOMEN LIMITED RIGHT UPPER QUADRANT COMPARISON:  None. FINDINGS: Gallbladder: Joaquim Lai is identified within the neck of gallbladder measuring 1 cm. No gallbladder wall thickening or pericholecystic fluid. Negative sonographic Murphy's sign. Common bile duct: Diameter: 2.9 mm Liver: Shadowing echogenic focus within the central right lobe of liver is  identified which may represent an area of calcification. Portal vein is patent on color Doppler imaging with normal direction of blood flow towards the liver. IMPRESSION: 1. Gallstone. 2. No gallbladder wall thickening and negative sonographic Murphy's sign. Electronically Signed   By: Kerby Moors M.D.   On: 06/15/2017 18:53   Dg Duanne Limerick W/water Sol Cm  Result Date: 06/20/2017 CLINICAL DATA:  Status post cholecystectomy 1 day ago. Anterior duodenal injury during surgery. Evaluate for leak. EXAM: WATER SOLUBLE UPPER GI SERIES TECHNIQUE: Single-column upper GI series was performed using water soluble contrast. CONTRAST:  129mL ISOVUE-300 IOPAMIDOL (ISOVUE-300) INJECTION 61% COMPARISON:  Cholangiogram of 06/19/2017.  Ultrasound of 06/15/2017. FLUOROSCOPY TIME:  Fluoroscopy Time:  9 minutes and 48 seconds Radiation Exposure Index (if provided by the fluoroscopic device): 203 mGy Number of Acquired Spot Images: 6 FINDINGS: Preprocedure scout film demonstrates cholecystectomy clips. Non-obstructive bowel gas pattern. Right upper quadrant surgical drain. Exam limited by patient postoperative status, recent pain medication administration, and relative immobility. The patient describes significant discomfort with attempts at lying right lateral or RPO. Suboptimal distention of the proximal duodenum. Suspect postoperative edema in this area. No gross free extravasation of contrast identified. No increased density within the surgical drain to suggest contrast within. 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 Example series 16. IMPRESSION: 1. Mildly limited exam, with limitations detailed above. 2. Given this factor, no evidence of large volume duodenal leak to suggest residual perforation. 3. Depending on clinical concern, exam could be repeated when patient has greater mobility and possibly using barium in addition to omni. Electronically Signed   By: Abigail Miyamoto  M.D.   On: 06/20/2017 15:23    Labs: Basic Metabolic Panel:  Recent Labs Lab 06/15/17 1133 06/18/17 1905 06/20/17 0522  NA 136 137 139  K 3.6 3.0* 3.2*  CL 103 102 104  CO2 25 23 27   GLUCOSE 108* 98 126*  BUN 13 9 <5*  CREATININE 0.93 0.92 0.70  CALCIUM 9.3 9.0 8.1*   Liver Function  Tests:  Recent Labs Lab 06/15/17 1133 06/18/17 1905 06/20/17 0522  AST 1,076* 46* 44*  ALT 847* 238* 134*  ALKPHOS 191* 167* 116  BILITOT 1.8* 1.1 0.7  PROT 6.4 7.1 5.6*  ALBUMIN 4.0 4.1 3.0*    CBC:  Recent Labs Lab 06/15/17 1133 06/18/17 1905 06/20/17 0522  WBC 9.3 8.3 6.2  NEUTROABS 7.9* 5.2  --   HGB 12.2 12.2 10.1*  HCT 37.2 36.3 30.3*  MCV 91.8 89.4 91.3  PLT 220.0 221 184    CBG: No results for input(s): GLUCAP in the last 168 hours.  Active Problems:   Chronic cholecystitis   Time coordinating discharge: <16min

## 2017-06-22 NOTE — Progress Notes (Signed)
Reviewed AVS and discharge summary w/pt. She verbalized understanding and offered no questions/concerns at this time. Pt discharged home via [facility] in stable condition w/pain controlled, all belongings and printed prescriptions.

## 2017-06-22 NOTE — Progress Notes (Addendum)
Pt states she does not take Armour Thyroid; says she takes Petra Kuba Thyroid, 130 mg (2 65 mg tabs) daily. States pharmacy has her medication downstairs. Paged Dr. Kieth Brightly to advise.   Received return call from Dr. Amie Portland nurse in [place]. He gave verbal order for ok for pt to resume home thyroid medication. Will notify  Pharmacy and place order.  Spoke with Anh in Pharmacy. States they can send it up after packaging or just return med and pt can take on her own. Pt states she is fine with taking on her own. Will go get med from pharmacy.

## 2017-06-22 NOTE — Progress Notes (Addendum)
Pt states she is to have JP drain removed prior to discharge. No orders are in. Paged Dr. Kieth Brightly to advise.  Received call from Dr. Kieth Brightly in response to above. VO to remove JP drain and cover wound with gauze and tape.  Order entered.

## 2017-06-27 ENCOUNTER — Telehealth: Payer: Self-pay | Admitting: Internal Medicine

## 2017-06-27 NOTE — Telephone Encounter (Signed)
Wanted to thank Dr. Quay Burow for her care last week.  Also wanted to pick up a copy of her elevated liver enzymes.

## 2017-06-28 NOTE — Telephone Encounter (Signed)
Spoke with pt. Labs printed off and placed upfront for pick-up.

## 2017-07-03 ENCOUNTER — Other Ambulatory Visit: Payer: Self-pay | Admitting: Internal Medicine

## 2017-07-03 ENCOUNTER — Encounter: Payer: Self-pay | Admitting: Internal Medicine

## 2017-07-03 MED ORDER — TRAZODONE HCL 50 MG PO TABS: 1.0000 mg | ORAL_TABLET | Freq: Every day | ORAL | 0 refills | Status: DC

## 2017-07-03 NOTE — Telephone Encounter (Signed)
[place] Controlled Substance Database checked. Last filled on 05/11/17

## 2017-07-03 NOTE — Telephone Encounter (Signed)
Ok to fill 

## 2017-07-04 NOTE — Telephone Encounter (Signed)
RX faxed to [facility] 

## 2017-07-06 ENCOUNTER — Encounter: Payer: Self-pay | Admitting: Internal Medicine

## 2017-07-11 DIAGNOSIS — Z0279 Encounter for issue of other medical certificate: Secondary | ICD-10-CM

## 2017-07-23 ENCOUNTER — Telehealth: Payer: Self-pay | Admitting: Internal Medicine

## 2017-07-23 DIAGNOSIS — R945 Abnormal results of liver function studies: Principal | ICD-10-CM

## 2017-07-23 DIAGNOSIS — R7989 Other specified abnormal findings of blood chemistry: Secondary | ICD-10-CM

## 2017-07-23 NOTE — Telephone Encounter (Signed)
I agree with rechecking - we can just order a cmp.  They did decrease and likely have returned to normal but ok to check.     Order cmp for elevate lfts

## 2017-07-23 NOTE — Telephone Encounter (Signed)
Patient states she had emergency gallbladder surgery done a month ago.  States she did follow up with surgeon.  But states she is concerned about her liver enzymes.  Would like to know if she could schedule a hospital follow up with [facility] to get this checked? Or if this is something she needs to worry about?

## 2017-07-24 NOTE — Telephone Encounter (Signed)
Spoke with pt to inform.  

## 2017-07-27 ENCOUNTER — Other Ambulatory Visit (INDEPENDENT_AMBULATORY_CARE_PROVIDER_SITE_OTHER): Payer: BC Managed Care – PPO

## 2017-07-27 DIAGNOSIS — R945 Abnormal results of liver function studies: Secondary | ICD-10-CM | POA: Diagnosis not present

## 2017-07-27 DIAGNOSIS — R7989 Other specified abnormal findings of blood chemistry: Secondary | ICD-10-CM

## 2017-07-27 LAB — COMPREHENSIVE METABOLIC PANEL
ALK PHOS: 59 U/L (ref 39–117)
ALT: 12 U/L (ref 0–35)
AST: 14 U/L (ref 0–37)
Albumin: 4.3 g/dL (ref 3.5–5.2)
BILIRUBIN TOTAL: 0.6 mg/dL (ref 0.2–1.2)
BUN: 10 mg/dL (ref 6–23)
CHLORIDE: 103 meq/L (ref 96–112)
CO2: 29 meq/L (ref 19–32)
CREATININE: 0.65 mg/dL (ref 0.40–1.20)
Calcium: 9.6 mg/dL (ref 8.4–10.5)
GFR: 97.72 mL/min (ref >60.00)
GLUCOSE: 117 mg/dL — AB (ref 70–99)
POTASSIUM: 3.8 meq/L (ref 3.5–5.1)
SODIUM: 138 meq/L (ref 135–145)
Total Protein: 6.5 g/dL (ref 6.0–8.3)

## 2017-07-29 ENCOUNTER — Encounter: Payer: Self-pay | Admitting: Internal Medicine

## 2017-09-12 ENCOUNTER — Telehealth: Payer: Self-pay | Admitting: Family Medicine

## 2017-09-12 NOTE — Telephone Encounter (Signed)
Is she a patient here?

## 2017-09-12 NOTE — Telephone Encounter (Signed)
WRONG PATIENT

## 2017-09-12 NOTE — Telephone Encounter (Signed)
Caryl Pina from [facility] is returning your call Cb#: [phone] ext. [place]

## 2017-09-13 ENCOUNTER — Other Ambulatory Visit: Payer: Self-pay

## 2017-09-13 ENCOUNTER — Ambulatory Visit (AMBULATORY_SURGERY_CENTER): Payer: Self-pay | Admitting: *Deleted

## 2017-09-13 VITALS — Ht 67.0 in | Wt 212.8 lb

## 2017-09-13 DIAGNOSIS — R1319 Other dysphagia: Secondary | ICD-10-CM

## 2017-09-13 DIAGNOSIS — Z800 Family history of malignant neoplasm of digestive organs: Secondary | ICD-10-CM

## 2017-09-13 DIAGNOSIS — R1310 Dysphagia, unspecified: Secondary | ICD-10-CM

## 2017-09-13 NOTE — Progress Notes (Signed)
No egg or soy (wishes not to have due to mother having Estrogen induced breast cancer.  No issues with past sedation with any surgeries  or procedures, no intubation problems  No diet pills per patient No home 02 use per patient  No blood thinners per patient  Pt denies issues with constipation on and off No A fib or A flutter  EMMI video sent to pt's e mail pt. declined

## 2017-09-14 ENCOUNTER — Encounter: Payer: Self-pay | Admitting: Internal Medicine

## 2017-09-27 ENCOUNTER — Encounter: Payer: Self-pay | Admitting: Internal Medicine

## 2017-09-27 ENCOUNTER — Ambulatory Visit (AMBULATORY_SURGERY_CENTER): Payer: BC Managed Care – PPO | Admitting: Internal Medicine

## 2017-09-27 ENCOUNTER — Other Ambulatory Visit: Payer: Self-pay

## 2017-09-27 VITALS — BP 112/65 | HR 64 | Temp 98.0°F | Resp 14 | Ht 67.0 in | Wt 212.0 lb

## 2017-09-27 DIAGNOSIS — Z1211 Encounter for screening for malignant neoplasm of colon: Secondary | ICD-10-CM | POA: Diagnosis present

## 2017-09-27 DIAGNOSIS — D120 Benign neoplasm of cecum: Secondary | ICD-10-CM | POA: Diagnosis not present

## 2017-09-27 DIAGNOSIS — Z800 Family history of malignant neoplasm of digestive organs: Secondary | ICD-10-CM

## 2017-09-27 DIAGNOSIS — Z1212 Encounter for screening for malignant neoplasm of rectum: Secondary | ICD-10-CM

## 2017-09-27 DIAGNOSIS — B9681 Helicobacter pylori [H. pylori] as the cause of diseases classified elsewhere: Secondary | ICD-10-CM | POA: Diagnosis not present

## 2017-09-27 DIAGNOSIS — K2950 Unspecified chronic gastritis without bleeding: Secondary | ICD-10-CM | POA: Diagnosis not present

## 2017-09-27 DIAGNOSIS — R1310 Dysphagia, unspecified: Secondary | ICD-10-CM | POA: Diagnosis not present

## 2017-09-27 DIAGNOSIS — K2970 Gastritis, unspecified, without bleeding: Secondary | ICD-10-CM

## 2017-09-27 DIAGNOSIS — K3189 Other diseases of stomach and duodenum: Secondary | ICD-10-CM

## 2017-09-27 DIAGNOSIS — Z86010 Personal history of colonic polyps: Secondary | ICD-10-CM | POA: Insufficient documentation

## 2017-09-27 MED ORDER — SODIUM CHLORIDE 0.9 % IV SOLN: 500.0000 mL | Freq: Once | INTRAVENOUS | Status: DC

## 2017-09-27 NOTE — Progress Notes (Signed)
Report given to PACU, vss 

## 2017-09-27 NOTE — Op Note (Signed)
[facility] Patient Name: Patricia Greene Procedure Date: 09/27/2017 10:39 AM MRN: [id number] Endoscopist: Gatha Mayer , MD Age: 61 Referring MD:  Date of Birth: [birth year] Gender: Female Account #: [id number] Procedure:                Colonoscopy Indications:              Colon cancer screening in patient at increased                            risk: Family history of 1st-degree relative with                            colon polyps, Screening patient at increased risk:                            Family history of 1st-degree relative with                            colorectal cancer at age 72 years (or older) Medicines:                Propofol per Anesthesia, Monitored Anesthesia Care Procedure:                Pre-Anesthesia Assessment:                           - Prior to the procedure, a History and Physical                            was performed, and patient medications and                            allergies were reviewed. The patient's tolerance of                            previous anesthesia was also reviewed. The risks                            and benefits of the procedure and the sedation                            options and risks were discussed with the patient.                            All questions were answered, and informed consent                            was obtained. Prior Anticoagulants: The patient has                            taken no previous anticoagulant or antiplatelet                            agents. ASA Grade Assessment: II - A patient with  mild systemic disease. After reviewing the risks                            and benefits, the patient was deemed in                            satisfactory condition to undergo the procedure.                           After obtaining informed consent, the colonoscope                            was passed under direct vision. Throughout the   procedure, the patient's blood pressure, pulse, and                            oxygen saturations were monitored continuously. The                            Colonoscope was introduced through the anus and                            advanced to the the cecum, identified by                            appendiceal orifice and ileocecal valve. The                            colonoscopy was performed without difficulty. The                            patient tolerated the procedure well. The quality                            of the bowel preparation was good. The bowel                            preparation used was Miralax. The ileocecal valve,                            appendiceal orifice, and rectum were photographed. Scope In: 10:59:08 AM Scope Out: 11:11:28 AM Scope Withdrawal Time: 0 hours 10 minutes 12 seconds  Total Procedure Duration: 0 hours 12 minutes 20 seconds  Findings:                 The perianal and digital rectal examinations were                            normal.                           A 5 mm polyp was found in the cecum. The polyp was                            sessile. The polyp  was removed with a cold snare.                            Resection and retrieval were complete. Verification                            of patient identification for the specimen was                            done. Estimated blood loss was minimal.                           The exam was otherwise without abnormality on                            direct and retroflexion views. Complications:            No immediate complications. Estimated Blood Loss:     Estimated blood loss was minimal. Impression:               - One 5 mm polyp in the cecum, removed with a cold                            snare. Resected and retrieved.                           - The examination was otherwise normal on direct                            and retroflexion views. Recommendation:           - Patient has a  contact number available for                            emergencies. The signs and symptoms of potential                            delayed complications were discussed with the                            patient. Return to normal activities tomorrow.                            Written discharge instructions were provided to the                            patient.                           - Clear liquids x 1 hour then soft foods rest of                            day. Start prior diet tomorrow. had esophageal                            dilation  prior to this.                           - Continue present medications.                           - Repeat colonoscopy is recommended. The                            colonoscopy date will be determined after pathology                            results from today's exam become available for                            review. Gatha Mayer, MD 09/27/2017 11:24:22 AM This report has been signed electronically.

## 2017-09-27 NOTE — Progress Notes (Signed)
Called to room to assist during endoscopic procedure.  Patient ID and intended procedure confirmed with present staff. Received instructions for my participation in the procedure from the performing physician.  

## 2017-09-27 NOTE — Progress Notes (Signed)
Pt's states no medical or surgical changes since previsit or office visit. 

## 2017-09-27 NOTE — Patient Instructions (Addendum)
There was a small area of abnormal mucosa in the stomach - ? Healing ulcer. Ibuprofen can do this. I took biopsies and will let you know - sometimes an infection can do this also.  I dilated the esophagus - no narrow areas but I do suspect it does not always contract and relax properly.  There was one colon polyp - small and benign-looking. It was removed.  I appreciate the opportunity to care for you. Gatha Mayer, MD, [facility]  *Handout given to patient on polyps*  YOU HAD AN ENDOSCOPIC PROCEDURE TODAY AT [place]:   Refer to the procedure report that was given to you for any specific questions about what was found during the examination.  If the procedure report does not answer your questions, please call your gastroenterologist to clarify.  If you requested that your care partner not be given the details of your procedure findings, then the procedure report has been included in a sealed envelope for you to review at your convenience later.  YOU SHOULD EXPECT: Some feelings of bloating in the abdomen. Passage of more gas than usual.  Walking can help get rid of the air that was put into your GI tract during the procedure and reduce the bloating. If you had a lower endoscopy (such as a colonoscopy or flexible sigmoidoscopy) you may notice spotting of blood in your stool or on the toilet paper. If you underwent a bowel prep for your procedure, you may not have a normal bowel movement for a few days.  Please Note:  You might notice some irritation and congestion in your nose or some drainage.  This is from the oxygen used during your procedure.  There is no need for concern and it should clear up in a day or so.  SYMPTOMS TO REPORT IMMEDIATELY:   Following lower endoscopy (colonoscopy or flexible sigmoidoscopy):  Excessive amounts of blood in the stool  Significant tenderness or worsening of abdominal pains  Swelling of the abdomen that is new, acute  Fever of  100F or higher   Following upper endoscopy (EGD)  Vomiting of blood or coffee ground material  New chest pain or pain under the shoulder blades  Painful or persistently difficult swallowing  New shortness of breath  Fever of 100F or higher  Black, tarry-looking stools  For urgent or emergent issues, a gastroenterologist can be reached at any hour by calling [phone].   DIET:  We do recommend a small meal at first, but then you may proceed to your regular diet.  Drink plenty of fluids but you should avoid alcoholic beverages for 24 hours.  ACTIVITY:  You should plan to take it easy for the rest of today and you should NOT DRIVE or use heavy machinery until tomorrow (because of the sedation medicines used during the test).    FOLLOW UP: Our staff will call the number listed on your records the next business day following your procedure to check on you and address any questions or concerns that you may have regarding the information given to you following your procedure. If we do not reach you, we will leave a message.  However, if you are feeling well and you are not experiencing any problems, there is no need to return our call.  We will assume that you have returned to your regular daily activities without incident.  If any biopsies were taken you will be contacted by phone or by letter within the  next 1-3 weeks.  Please call us at [phone] if you have not heard about the biopsies in 3 weeks.    SIGNATURES/CONFIDENTIALITY: You and/or your care partner have signed paperwork which will be entered into your electronic medical record.  These signatures attest to the fact that that the information above on your After Visit Summary has been reviewed and is understood.  Full responsibility of the confidentiality of this discharge information lies with you and/or your care-partner.YOU HAD AN ENDOSCOPIC PROCEDURE TODAY AT [facility]:   Refer to the procedure report  that was given to you for any specific questions about what was found during the examination.  If the procedure report does not answer your questions, please call your gastroenterologist to clarify.  If you requested that your care partner not be given the details of your procedure findings, then the procedure report has been included in a sealed envelope for you to review at your convenience later.  YOU SHOULD EXPECT: Some feelings of bloating in the abdomen. Passage of more gas than usual.  Walking can help get rid of the air that was put into your GI tract during the procedure and reduce the bloating. If you had a lower endoscopy (such as a colonoscopy or flexible sigmoidoscopy) you may notice spotting of blood in your stool or on the toilet paper. If you underwent a bowel prep for your procedure, you may not have a normal bowel movement for a few days.  Please Note:  You might notice some irritation and congestion in your nose or some drainage.  This is from the oxygen used during your procedure.  There is no need for concern and it should clear up in a day or so.  SYMPTOMS TO REPORT IMMEDIATELY:   Following lower endoscopy (colonoscopy or flexible sigmoidoscopy):  Excessive amounts of blood in the stool  Significant tenderness or worsening of abdominal pains  Swelling of the abdomen that is new, acute  Fever of 100F or higher   Following upper endoscopy (EGD)  Vomiting of blood or coffee ground material  New chest pain or pain under the shoulder blades  Painful or persistently difficult swallowing  New shortness of breath  Fever of 100F or higher  Black, tarry-looking stools  For urgent or emergent issues, a gastroenterologist can be reached at any hour by calling [phone].   DIET:  We do recommend a small meal at first, but then you may proceed to your regular diet.  Drink plenty of fluids but you should avoid alcoholic beverages for 24 hours.  ACTIVITY:  You should plan to  take it easy for the rest of today and you should NOT DRIVE or use heavy machinery until tomorrow (because of the sedation medicines used during the test).    FOLLOW UP: Our staff will call the number listed on your records the next business day following your procedure to check on you and address any questions or concerns that you may have regarding the information given to you following your procedure. If we do not reach you, we will leave a message.  However, if you are feeling well and you are not experiencing any problems, there is no need to return our call.  We will assume that you have returned to your regular daily activities without incident.  If any biopsies were taken you will be contacted by phone or by letter within the next 1-3 weeks.  Please call us at [phone] if you  have not heard about the biopsies in 3 weeks.    SIGNATURES/CONFIDENTIALITY: You and/or your care partner have signed paperwork which will be entered into your electronic medical record.  These signatures attest to the fact that that the information above on your After Visit Summary has been reviewed and is understood.  Full responsibility of the confidentiality of this discharge information lies with you and/or your care-partner.

## 2017-09-27 NOTE — Op Note (Signed)
[facility] Patient Name: Patricia Greene Procedure Date: 09/27/2017 10:39 AM MRN: [id number] Endoscopist: Gatha Mayer , MD Age: 61 Referring MD:  Date of Birth: 1956-07-31 Gender: Female Account #: [id number] Procedure:                Upper GI endoscopy Indications:              Dysphagia Medicines:                Propofol per Anesthesia, Monitored Anesthesia Care Procedure:                Pre-Anesthesia Assessment:                           - Prior to the procedure, a History and Physical                            was performed, and patient medications and                            allergies were reviewed. The patient's tolerance of                            previous anesthesia was also reviewed. The risks                            and benefits of the procedure and the sedation                            options and risks were discussed with the patient.                            All questions were answered, and informed consent                            was obtained. Prior Anticoagulants: The patient has                            taken no previous anticoagulant or antiplatelet                            agents. ASA Grade Assessment: II - A patient with                            mild systemic disease. After reviewing the risks                            and benefits, the patient was deemed in                            satisfactory condition to undergo the procedure.                           After obtaining informed consent, the endoscope was  passed under direct vision. Throughout the                            procedure, the patient's blood pressure, pulse, and                            oxygen saturations were monitored continuously. The                            Endoscope was introduced through the mouth, and                            advanced to the second part of duodenum. The upper                            GI endoscopy was  accomplished without difficulty.                            The patient tolerated the procedure well. Scope In: Scope Out: Findings:                 The examined esophagus was moderately tortuous. The                            scope was withdrawn. Dilation was performed with a                            Maloney dilator with mild resistance at 64 Fr.                            Estimated blood loss: none.                           A single localized, 8 mm non-bleeding erosion was                            found in the prepyloric region of the stomach.                            There were no stigmata of recent bleeding. Biopsies                            were taken with a cold forceps for histology.                            Verification of patient identification for the                            specimen was done. Estimated blood loss was minimal.                           The exam was otherwise without abnormality.  The cardia and gastric fundus were normal on                            retroflexion. Complications:            No immediate complications. Estimated Blood Loss:     Estimated blood loss was minimal. Impression:               - Tortuous esophagus.                           - Non-bleeding erosive gastropathy. Biopsied.                            singkle area at pre-pyloric region, ? healing ulcer                           - The examination was otherwise normal. Recommendation:           - Patient has a contact number available for                            emergencies. The signs and symptoms of potential                            delayed complications were discussed with the                            patient. Return to normal activities tomorrow.                            Written discharge instructions were provided to the                            patient.                           - Clear liquids x 1 hour then soft foods rest of                             day. Start prior diet tomorrow.                           - Continue present medications.                           - Await pathology results.                           - See the other procedure note for documentation of                            additional recommendations. Gatha Mayer, MD 09/27/2017 11:21:12 AM This report has been signed electronically.

## 2017-09-28 ENCOUNTER — Telehealth: Payer: Self-pay | Admitting: *Deleted

## 2017-09-28 NOTE — Telephone Encounter (Signed)
  Follow up Call-  Call back number 09/27/2017  Post procedure Call Back phone  # [phone]  Permission to leave phone message Yes  Some recent data might be hidden     Patient questions:  Do you have a fever, pain , or abdominal swelling? No. Pain Score  0 *  Have you tolerated food without any problems? Yes.    Have you been able to return to your normal activities? Yes.    Do you have any questions about your discharge instructions: Diet   No. Medications  No. Follow up visit  No.  Do you have questions or concerns about your Care? No.  Actions: * If pain score is 4 or above: No action needed, pain <4.

## 2017-10-02 ENCOUNTER — Encounter: Payer: Self-pay | Admitting: Internal Medicine

## 2017-10-02 DIAGNOSIS — K2970 Gastritis, unspecified, without bleeding: Secondary | ICD-10-CM

## 2017-10-02 DIAGNOSIS — Z8619 Personal history of other infectious and parasitic diseases: Secondary | ICD-10-CM | POA: Insufficient documentation

## 2017-10-02 DIAGNOSIS — B9681 Helicobacter pylori [H. pylori] as the cause of diseases classified elsewhere: Secondary | ICD-10-CM

## 2017-10-02 HISTORY — DX: Helicobacter pylori (H. pylori) as the cause of diseases classified elsewhere: B96.81

## 2017-10-02 NOTE — Progress Notes (Signed)
Please call patient from office:  1) She has H pylori gastritis needs Tx:  1) Omeprazole 20 mg 2 times a day x 14 d 2) Pepto Bismol 2 tabs (262 mg each) 4 times a day x 14 d 3) Metronidazole 250 mg 4 times a day x 14 d 4) doxycycline 100 mg 2 times a day x 14 d  After 14 d stop omeprazole also  In 4 weeks after treatment completed do H. Pylori stool antigen - dx H. Pylori gastritis  2) colon polyp benign/pre-cancerous  COLON RECALL 5 years placed by [place]  3) Ask her if dilation helped dysphagia and let me know

## 2017-10-03 ENCOUNTER — Other Ambulatory Visit: Payer: Self-pay | Admitting: Internal Medicine

## 2017-10-03 ENCOUNTER — Other Ambulatory Visit: Payer: Self-pay

## 2017-10-03 DIAGNOSIS — A048 Other specified bacterial intestinal infections: Secondary | ICD-10-CM

## 2017-10-03 MED ORDER — OMEPRAZOLE 20 MG PO CPDR: 20.0000 mg | DELAYED_RELEASE_CAPSULE | Freq: Two times a day (BID) | ORAL | 0 refills | Status: DC

## 2017-10-03 MED ORDER — DOXYCYCLINE HYCLATE 100 MG PO CAPS: 100.0000 mg | ORAL_CAPSULE | Freq: Two times a day (BID) | ORAL | 0 refills | Status: AC

## 2017-10-03 MED ORDER — BISMUTH SUBSALICYLATE 262 MG PO CHEW: 524.0000 mg | CHEWABLE_TABLET | Freq: Four times a day (QID) | ORAL | 0 refills | Status: AC

## 2017-10-03 MED ORDER — METRONIDAZOLE 250 MG PO TABS: 250.0000 mg | ORAL_TABLET | Freq: Four times a day (QID) | ORAL | 0 refills | Status: AC

## 2017-10-04 NOTE — Telephone Encounter (Signed)
[place] Controlled Substance Database checked. Last filled on 08/16/17

## 2017-10-11 ENCOUNTER — Telehealth: Payer: Self-pay | Admitting: Internal Medicine

## 2017-10-12 NOTE — Telephone Encounter (Signed)
Patient notified that black stools are most likely from Peptobismol for h pylori tx.  She will call back once she completes the treatment if her stools remain black.

## 2017-10-25 ENCOUNTER — Telehealth: Payer: Self-pay | Admitting: Internal Medicine

## 2017-10-25 NOTE — Telephone Encounter (Signed)
Patient notified that she will submit her sample after she picks up the container in one month

## 2017-11-20 ENCOUNTER — Other Ambulatory Visit: Payer: BC Managed Care – PPO

## 2017-12-12 ENCOUNTER — Other Ambulatory Visit: Payer: BC Managed Care – PPO

## 2017-12-12 ENCOUNTER — Other Ambulatory Visit: Payer: Self-pay | Admitting: Internal Medicine

## 2017-12-12 DIAGNOSIS — K2970 Gastritis, unspecified, without bleeding: Principal | ICD-10-CM

## 2017-12-12 DIAGNOSIS — B9681 Helicobacter pylori [H. pylori] as the cause of diseases classified elsewhere: Secondary | ICD-10-CM

## 2017-12-13 LAB — HELICOBACTER PYLORI  SPECIAL ANTIGEN
MICRO NUMBER: 90319397
SPECIMEN QUALITY: ADEQUATE

## 2017-12-14 ENCOUNTER — Other Ambulatory Visit: Payer: Self-pay | Admitting: Internal Medicine

## 2017-12-14 NOTE — Progress Notes (Signed)
Let her know H pylori is gone  Hope she is well  See me prn

## 2018-01-03 ENCOUNTER — Encounter: Payer: BC Managed Care – PPO | Admitting: Internal Medicine

## 2018-01-22 ENCOUNTER — Ambulatory Visit: Payer: BC Managed Care – PPO | Admitting: Family Medicine

## 2018-01-22 ENCOUNTER — Encounter: Payer: Self-pay | Admitting: Family Medicine

## 2018-01-22 ENCOUNTER — Ambulatory Visit: Payer: BC Managed Care – PPO | Admitting: Internal Medicine

## 2018-01-22 VITALS — BP 132/78 | HR 87 | Temp 98.2°F | Ht 67.0 in | Wt 210.0 lb

## 2018-01-22 DIAGNOSIS — M25562 Pain in left knee: Secondary | ICD-10-CM

## 2018-01-22 NOTE — Patient Instructions (Signed)
Please try to ice the area  Please try the exercise  Please follow up with me in 3-4 weeks if your pain hasn't improved  Pennsaid if the topical cream we can try

## 2018-01-22 NOTE — Progress Notes (Signed)
Patricia Greene - 62 y.o. female MRN [id number]  Date of birth: December 10, 1955  SUBJECTIVE:  Including CC & ROS.  Chief Complaint  Patient presents with  . Left knee pain    Patricia Greene is a 62 y.o. female that is presenting for left knee pain. Ongoing for two months. Pain is located the anterior in nature.  Pain is intermittent, described as an ache. Denies swelling or tenderness. Pain is mild to severe upon flexion. She has been taking tylenol for the pain. Denies injury or surgery. She walks daily upstairs for her job.  Pain is worse from rising from a seated position or going up or down stairs.  Pain is localized to the knee.  Denies any locking or giving way.    Review of Systems  Constitutional: Negative for fever.  HENT: Negative for congestion.   Respiratory: Negative for shortness of breath.   Cardiovascular: Negative for chest pain.  Gastrointestinal: Negative for abdominal pain.  Musculoskeletal: Negative for gait problem.  Skin: Negative for color change.  Neurological: Negative for weakness.  Hematological: Negative for adenopathy.  Psychiatric/Behavioral: Negative for agitation.    HISTORY: Past Medical, Surgical, Social, and Family History Reviewed & Updated per EMR.   Pertinent Historical Findings include:  Past Medical History:  Diagnosis Date  . Allergic rhinitis   . Allergy   . Anxiety state, unspecified   . Arthritis    left thumb  . Carotid bruit   . Cervicalgia   . Diverticulosis   . Fibromyalgia    better since breast implants were removed  . GERD (gastroesophageal reflux disease)   . Helicobacter pylori gastritis 10/02/2017   EGD and bx 12/.2018 Quad tx   . History of migraine headaches   . Hypothyroidism    nodular goiter  . IBS (irritable bowel syndrome)   . Insomnia   . Obesity   . Unspecified vitamin D deficiency     Past Surgical History:  Procedure Laterality Date  . BREAST ENHANCEMENT SURGERY    . breast implants removed    .  CHOLECYSTECTOMY N/A 06/19/2017   Procedure: LAPAROSCOPIC CHOLECYSTECTOMY WITH INTRAOPERATIVE CHOLANGIOGRAM,REPAIR OF DUODENAL INJURY;  Surgeon: Kieth Brightly, Arta Bruce, MD;  Location: [facility];  Service: General;  Laterality: N/A;  . [place], 2004 Sammuel Cooper), 08/09/2011   1999, 2004: diverticulosis  . finger reattachment    . THYROIDECTOMY  1973   right, toxic nodule  . TONSILLECTOMY      Allergies  Allergen Reactions  . Cortisone     All steroids  . Silicone Other (See Comments)    Pt reports autoimmune diseases, fatigue, hair falling out    Family History  Problem Relation Age of Onset  . Asthma Mother   . Breast cancer Mother   . Allergic rhinitis Mother   . Heart disease Mother   . Colon cancer Father 54       died at 53  . Colon polyps Sister   . Esophageal cancer Neg Hx   . Stomach cancer Neg Hx   . Rectal cancer Neg Hx      Social History   Socioeconomic History  . Marital status: Divorced    Spouse name: Not on file  . Number of children: 0  . Years of education: Not on file  . Highest education level: Not on file  Occupational History  . Occupation: Therapist, sports  Social Needs  . Financial resource strain: Not on file  . Food insecurity:  Worry: Not on file    Inability: Not on file  . Transportation needs:    Medical: Not on file    Non-medical: Not on file  Tobacco Use  . Smoking status: Never Smoker  . Smokeless tobacco: Never Used  Substance and Sexual Activity  . Alcohol use: No    Comment: rare   . Drug use: No  . Sexual activity: Not on file  Lifestyle  . Physical activity:    Days per week: Not on file    Minutes per session: Not on file  . Stress: Not on file  Relationships  . Social connections:    Talks on phone: Not on file    Gets together: Not on file    Attends religious service: Not on file    Active member of club or organization: Not on file    Attends meetings of clubs or organizations: Not on file    Relationship status:  Not on file  . Intimate partner violence:    Fear of current or ex partner: Not on file    Emotionally abused: Not on file    Physically abused: Not on file    Forced sexual activity: Not on file  Other Topics Concern  . Not on file  Social History Narrative   2-3 caffeine drinks daily    Divorced, no children. 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, now at school system     PHYSICAL EXAM:  VS: BP 132/78 (BP Location: Left Arm, Patient Position: Sitting, Cuff Size: Normal)   Pulse 87   Temp 98.2 F (36.8 C) (Oral)   Ht 5\' 7"  (1.702 m)   Wt 210 lb (95.3 kg)   SpO2 97%   BMI 32.89 kg/m  Physical Exam Gen: NAD, alert, cooperative with exam, well-appearing ENT: normal lips, normal nasal mucosa,  Eye: normal EOM, normal conjunctiva and lids CV:  no edema, +2 pedal pulses   Resp: no accessory muscle use, non-labored,  Skin: no rashes, no areas of induration  Neuro: normal tone, normal sensation to touch Psych:  normal insight, alert and oriented MSK:  Left knee:  Normal to inspection with no erythema or effusion or obvious bony abnormalities. Palpation normal with no warmth, joint line tenderness, patellar tenderness, or condyle tenderness. ROM full in flexion and extension and lower leg rotation. Ligaments with solid consistent endpoints including  LCL, MCL. Negative Mcmurray's. Non painful patellar compression. Patellar glide without crepitus. Patellar and quadriceps tendons unremarkable. Hamstring and quadriceps strength is normal.  Weakness with hip abduction  Neurovascularly intact         ASSESSMENT & PLAN:   Acute pain of left knee Pain likely related to patellofemoral syndrome.  Has weakness with hip abduction. -Counseled on home exercise therapy -If no improvement consider injection versus physical therapy.

## 2018-01-23 DIAGNOSIS — G8929 Other chronic pain: Secondary | ICD-10-CM | POA: Insufficient documentation

## 2018-01-23 DIAGNOSIS — M25562 Pain in left knee: Secondary | ICD-10-CM

## 2018-01-23 NOTE — Assessment & Plan Note (Signed)
Pain likely related to patellofemoral syndrome.  Has weakness with hip abduction. -Counseled on home exercise therapy -If no improvement consider injection versus physical therapy.

## 2018-02-07 ENCOUNTER — Encounter (INDEPENDENT_AMBULATORY_CARE_PROVIDER_SITE_OTHER): Payer: Self-pay | Admitting: Orthopaedic Surgery

## 2018-02-07 ENCOUNTER — Ambulatory Visit (INDEPENDENT_AMBULATORY_CARE_PROVIDER_SITE_OTHER): Payer: BC Managed Care – PPO

## 2018-02-07 ENCOUNTER — Ambulatory Visit (INDEPENDENT_AMBULATORY_CARE_PROVIDER_SITE_OTHER): Payer: BC Managed Care – PPO | Admitting: Orthopaedic Surgery

## 2018-02-07 DIAGNOSIS — M25562 Pain in left knee: Secondary | ICD-10-CM | POA: Diagnosis not present

## 2018-02-07 MED ORDER — TRAMADOL HCL 50 MG PO TABS: 100.0000 mg | ORAL_TABLET | Freq: Four times a day (QID) | ORAL | 0 refills | Status: DC | PRN

## 2018-02-07 NOTE — Addendum Note (Signed)
Addended by: Marlyne Beards on: 02/07/2018 03:13 PM   Modules accepted: Orders

## 2018-02-07 NOTE — Progress Notes (Signed)
Office Visit Note   Patient: Patricia Greene           Date of Birth: August 15, 1956           MRN: [id number] Visit Date: 02/07/2018              Requested by: Binnie Rail, MD [place], [place] [address] PCP: Binnie Rail, MD   Assessment & Plan: Visit Diagnoses:  1. Acute pain of left knee     Plan: Based on her clinical exam as well as signs and symptoms I am certainly concerned that she has a meniscal tear.  She may even have a leaking Baker's cyst based off the burning sensations to having the back of her knee as well.  She is Artie been through activity modification as well as taken Tylenol and working on stretching and other activities through her primary care physician and therapy.  We cannot place a steroid injection in her knee due to her retina disease.  At this point my concern of a meniscal tear is enough that we need to obtain an MRI of her left knee to rule out a meniscal tear.  We will see her back after the studies obtained.  All questions and concerns were answered and addressed.  Follow-Up Instructions: Return in about 2 weeks (around 02/21/2018).   Orders:  Orders Placed This Encounter  Procedures  . XR Knee 1-2 Views Left   Meds ordered this encounter  Medications  . traMADol (ULTRAM) 50 MG tablet    Sig: Take 2 tablets (100 mg total) by mouth every 6 (six) hours as needed.    Dispense:  40 tablet    Refill:  0      Procedures: No procedures performed   Clinical Data: No additional findings.   Subjective: Chief Complaint  Patient presents with  . Left Knee - Pain, Edema  Patient is a very pleasant 62 year old who comes in with 2 months of worsening left knee pain with no known injury.  She developed a significant mild swelling in that knee and is tried some therapy on it.  She cannot take steroids or have a steroid injection due to severe retina disease.  She also cannot take regular anti-inflammatories.  She says the knee hurts quite a  bit with flexion and she is a lot of pain in the back of her knee and swelling in that leg in general.  Pivoting activities cause a lot of pain as well.  HPI  Review of Systems Other than retinopathy she has no significant systemic illnesses right now or acute findings.  Objective: Vital Signs: There were no vitals taken for this visit.  Physical Exam She is alert and oriented in no acute distress examination of her left knee does show slight effusion slight warmth.  Wants to start flexing her past 90 degrees she has significant amount of pain in the back of her knee.  She is very tender in the proximal calf but not distally.  She does have significant pain over trochanteric area. Ortho Exam  Specialty Comments:  No specialty comments available.  Imaging: Xr Knee 1-2 Views Left  Result Date: 02/07/2018 2 views of the left knee show well-maintained knee joint with no acute findings.  There is slight patellofemoral arthritic changes.  There is a slight effusion.  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    PMFS History: Patient Active Problem List  Diagnosis Date Noted  . Acute pain of left knee 01/23/2018  . Helicobacter pylori gastritis 10/02/2017  . Hx of adenomatous polyp of colon 09/27/2017  . Chronic cholecystitis 06/18/2017  . Epigastric pain 06/15/2017  . Lymphadenopathy of right cervical region 04/06/2017  . Closed wedge compression fracture of T11 vertebra ([place]) 03/15/2017  . Chest tightness 03/14/2017  . Reactive airway disease with wheezing 03/14/2017  . Acute bronchitis 03/14/2017  . Sinus pain 03/06/2017  . Back muscle spasm 10/04/2016  . Cough 09/26/2016  . Abnormal MRI, breast 04/20/2016  . Breast pain, left 04/15/2016  . H/O breast implant 04/15/2016  . Polyarthralgia 10/18/2015  . Osteopenia 02/01/2015  . Allergic rhinitis, cause unspecified 03/11/2011  . Vitamin D deficiency 03/13/2010  .  HYPERCHOLESTEROLEMIA, BORDERLINE 03/13/2010  . MIGRAINE HEADACHE 03/13/2010  . CAROTID BRUIT 03/13/2010  . Hypothyroidism 03/09/2010  . INSOMNIA, CHRONIC 02/20/2008  . ANXIETY 09/06/2007  . FIBROMYALGIA 09/06/2007  . IRRITABLE BOWEL SYNDROME, HX OF 09/06/2007   Past Medical History:  Diagnosis Date  . Allergic rhinitis   . Allergy   . Anxiety state, unspecified   . Arthritis    left thumb  . Carotid bruit   . Cervicalgia   . Diverticulosis   . Fibromyalgia    better since breast implants were removed  . GERD (gastroesophageal reflux disease)   . Helicobacter pylori gastritis 10/02/2017   EGD and bx 12/.2018 Quad tx   . History of migraine headaches   . Hypothyroidism    nodular goiter  . IBS (irritable bowel syndrome)   . Insomnia   . Obesity   . Unspecified vitamin D deficiency     Family History  Problem Relation Age of Onset  . Asthma Mother   . Breast cancer Mother   . Allergic rhinitis Mother   . Heart disease Mother   . Colon cancer Father 33       died at 77  . Colon polyps Sister   . Esophageal cancer Neg Hx   . Stomach cancer Neg Hx   . Rectal cancer Neg Hx     Past Surgical History:  Procedure Laterality Date  . BREAST ENHANCEMENT SURGERY    . breast implants removed    . CHOLECYSTECTOMY N/A 06/19/2017   Procedure: LAPAROSCOPIC CHOLECYSTECTOMY WITH INTRAOPERATIVE CHOLANGIOGRAM,REPAIR OF DUODENAL INJURY;  Surgeon: Kieth Brightly, Arta Bruce, MD;  Location: [facility];  Service: General;  Laterality: N/A;  . [place], 2004 Sammuel Cooper), 08/09/2011   1999, 2004: diverticulosis  . finger reattachment    . THYROIDECTOMY  1973   right, toxic nodule  . TONSILLECTOMY     Social History   Occupational History  . Occupation: Therapist, sports  Tobacco Use  . Smoking status: Never Smoker  . Smokeless tobacco: Never Used  Substance and Sexual Activity  . Alcohol use: No    Comment: rare   . Drug use: No  . Sexual activity: Not on file

## 2018-02-11 ENCOUNTER — Ambulatory Visit (INDEPENDENT_AMBULATORY_CARE_PROVIDER_SITE_OTHER): Payer: BC Managed Care – PPO | Admitting: Physician Assistant

## 2018-02-16 ENCOUNTER — Ambulatory Visit
Admission: RE | Admit: 2018-02-16 | Discharge: 2018-02-16 | Disposition: A | Discharge status: Home / Self Care | Payer: BC Managed Care – PPO | Source: Ambulatory Visit | Attending: Orthopaedic Surgery | Admitting: Orthopaedic Surgery

## 2018-02-16 DIAGNOSIS — M25562 Pain in left knee: Secondary | ICD-10-CM

## 2018-02-21 ENCOUNTER — Telehealth (INDEPENDENT_AMBULATORY_CARE_PROVIDER_SITE_OTHER): Payer: Self-pay

## 2018-02-21 ENCOUNTER — Ambulatory Visit (INDEPENDENT_AMBULATORY_CARE_PROVIDER_SITE_OTHER): Payer: BC Managed Care – PPO | Admitting: Orthopaedic Surgery

## 2018-02-21 ENCOUNTER — Encounter (INDEPENDENT_AMBULATORY_CARE_PROVIDER_SITE_OTHER): Payer: Self-pay | Admitting: Orthopaedic Surgery

## 2018-02-21 DIAGNOSIS — G8929 Other chronic pain: Secondary | ICD-10-CM

## 2018-02-21 DIAGNOSIS — M1712 Unilateral primary osteoarthritis, left knee: Secondary | ICD-10-CM | POA: Insufficient documentation

## 2018-02-21 DIAGNOSIS — M25562 Pain in left knee: Secondary | ICD-10-CM | POA: Diagnosis not present

## 2018-02-21 NOTE — Progress Notes (Signed)
The patient is coming in today to review an MRI of her left knee.  She has had a lot of pain and swelling in the knee pain with activity especially medially and posteriorly.  Her x-rays showed a well located and normal joint space.  She cannot have a steroid injection due to severe retina disease in her eye.  On exam she continues to have posterior medial pain as well as some lateral pain in her knee.  Past 90 degrees of flexion she has significant pain globally in the knee.  MRI is reviewed with her and it does show mild cartilage loss medially and laterally but this is only partial-thickness with no full-thickness.  She has significant patellofemoral arthritic changes.  The question whether or not there is a lateral meniscal tear as well.  At this point I do feel that she is a candidate for hyaluronic acid I gave her hand out about hyaluronic acid with Synvisc 1.  I do feel that this is the next step for her to try as well as quad strengthening exercises and activity modification.  All questions concerns were answered and addressed.  We will see her back to place hyaluronic acid in her knee once is approved.

## 2018-02-21 NOTE — Telephone Encounter (Signed)
Please submit for  Synvisc inj  Left knee  Dr Ninfa Linden

## 2018-02-21 NOTE — Telephone Encounter (Signed)
Noted  

## 2018-02-27 ENCOUNTER — Telehealth (INDEPENDENT_AMBULATORY_CARE_PROVIDER_SITE_OTHER): Payer: Self-pay

## 2018-02-27 NOTE — Telephone Encounter (Signed)
PA required for SynviscOne injection, left knee.

## 2018-03-04 LAB — HM MAMMOGRAPHY

## 2018-03-12 NOTE — Patient Instructions (Addendum)
Test(s) ordered today. Your results will be released to [place] (or called to you) after review, usually within 72hours after test completion. If any changes need to be made, you will be notified at that same time.  All other Health Maintenance issues reviewed.   All recommended immunizations and age-appropriate screenings are up-to-date or discussed.  No immunizations administered today.   Medications reviewed and updated.  Changes include trying maxalt for your migraines.  Your prescription(s) have been submitted to your pharmacy. Please take as directed and contact our office if you believe you are having problem(s) with the medication(s).  A referral was ordered for orthopedics.   Please followup in one year   Health Maintenance, Female Adopting a healthy lifestyle and getting preventive care can go a long way to promote health and wellness. Talk with your health care provider about what schedule of regular examinations is right for you. This is a good chance for you to check in with your provider about disease prevention and staying healthy. In between checkups, there are plenty of things you can do on your own. Experts have done a lot of research about which lifestyle changes and preventive measures are most likely to keep you healthy. Ask your health care provider for more information. Weight and diet Eat a healthy diet  Be sure to include plenty of vegetables, fruits, low-fat dairy products, and lean protein.  Do not eat a lot of foods high in solid fats, added sugars, or salt.  Get regular exercise. This is one of the most important things you can do for your health. ? Most adults should exercise for at least 150 minutes each week. The exercise should increase your heart rate and make you sweat (moderate-intensity exercise). ? Most adults should also do strengthening exercises at least twice a week. This is in addition to the moderate-intensity exercise.  Maintain a healthy  weight  Body mass index (BMI) is a measurement that can be used to identify possible weight problems. It estimates body fat based on height and weight. Your health care provider can help determine your BMI and help you achieve or maintain a healthy weight.  For females 52 years of age and older: ? A BMI below 18.5 is considered underweight. ? A BMI of 18.5 to 24.9 is normal. ? A BMI of 25 to 29.9 is considered overweight. ? A BMI of 30 and above is considered obese.  Watch levels of cholesterol and blood lipids  You should start having your blood tested for lipids and cholesterol at 64 years of age, then have this test every 5 years.  You may need to have your cholesterol levels checked more often if: ? Your lipid or cholesterol levels are high. ? You are older than 64 years of age. ? You are at high risk for heart disease.  Cancer screening Lung Cancer  Lung cancer screening is recommended for adults 70-61 years old who are at high risk for lung cancer because of a history of smoking.  A yearly low-dose CT scan of the lungs is recommended for people who: ? Currently smoke. ? Have quit within the past 15 years. ? Have at least a 30-pack-year history of smoking. A pack year is smoking an average of one pack of cigarettes a day for 1 year.  Yearly screening should continue until it has been 15 years since you quit.  Yearly screening should stop if you develop a health problem that would prevent you from having lung cancer  treatment.  Breast Cancer  Practice breast self-awareness. This means understanding how your breasts normally appear and feel.  It also means doing regular breast self-exams. Let your health care provider know about any changes, no matter how small.  If you are in your 20s or 30s, you should have a clinical breast exam (CBE) by a health care provider every 1-3 years as part of a regular health exam.  If you are 23 or older, have a CBE every year. Also consider  having a breast X-ray (mammogram) every year.  If you have a family history of breast cancer, talk to your health care provider about genetic screening.  If you are at high risk for breast cancer, talk to your health care provider about having an MRI and a mammogram every year.  Breast cancer gene (BRCA) assessment is recommended for women who have family members with BRCA-related cancers. BRCA-related cancers include: ? Breast. ? Ovarian. ? Tubal. ? Peritoneal cancers.  Results of the assessment will determine the need for genetic counseling and BRCA1 and BRCA2 testing.  Cervical Cancer Your health care provider may recommend that you be screened regularly for cancer of the pelvic organs (ovaries, uterus, and vagina). This screening involves a pelvic examination, including checking for microscopic changes to the surface of your cervix (Pap test). You may be encouraged to have this screening done every 3 years, beginning at age 41.  For women ages 45-65, health care providers may recommend pelvic exams and Pap testing every 3 years, or they may recommend the Pap and pelvic exam, combined with testing for human papilloma virus (HPV), every 5 years. Some types of HPV increase your risk of cervical cancer. Testing for HPV may also be done on women of any age with unclear Pap test results.  Other health care providers may not recommend any screening for nonpregnant women who are considered low risk for pelvic cancer and who do not have symptoms. Ask your health care provider if a screening pelvic exam is right for you.  If you have had past treatment for cervical cancer or a condition that could lead to cancer, you need Pap tests and screening for cancer for at least 20 years after your treatment. If Pap tests have been discontinued, your risk factors (such as having a new sexual partner) need to be reassessed to determine if screening should resume. Some women have medical problems that increase  the chance of getting cervical cancer. In these cases, your health care provider may recommend more frequent screening and Pap tests.  Colorectal Cancer  This type of cancer can be detected and often prevented.  Routine colorectal cancer screening usually begins at 64 years of age and continues through 64 years of age.  Your health care provider may recommend screening at an earlier age if you have risk factors for colon cancer.  Your health care provider may also recommend using home test kits to check for hidden blood in the stool.  A small camera at the end of a tube can be used to examine your colon directly (sigmoidoscopy or colonoscopy). This is done to check for the earliest forms of colorectal cancer.  Routine screening usually begins at age 54.  Direct examination of the colon should be repeated every 5-10 years through 64 years of age. However, you may need to be screened more often if early forms of precancerous polyps or small growths are found.  Skin Cancer  Check your skin from head to toe regularly.  Tell your health care provider about any new moles or changes in moles, especially if there is a change in a mole's shape or color.  Also tell your health care provider if you have a mole that is larger than the size of a pencil eraser.  Always use sunscreen. Apply sunscreen liberally and repeatedly throughout the day.  Protect yourself by wearing long sleeves, pants, a wide-brimmed hat, and sunglasses whenever you are outside.  Heart disease, diabetes, and high blood pressure  High blood pressure causes heart disease and increases the risk of stroke. High blood pressure is more likely to develop in: ? People who have blood pressure in the high end of the normal range (130-139/85-89 mm Hg). ? People who are overweight or obese. ? People who are African American.  If you are 1-35 years of age, have your blood pressure checked every 3-5 years. If you are 59 years of age  or older, have your blood pressure checked every year. You should have your blood pressure measured twice-once when you are at a hospital or clinic, and once when you are not at a hospital or clinic. Record the average of the two measurements. To check your blood pressure when you are not at a hospital or clinic, you can use: ? An automated blood pressure machine at a pharmacy. ? A home blood pressure monitor.  If you are between 63 years and 20 years old, ask your health care provider if you should take aspirin to prevent strokes.  Have regular diabetes screenings. This involves taking a blood sample to check your fasting blood sugar level. ? If you are at a normal weight and have a low risk for diabetes, have this test once every three years after 64 years of age. ? If you are overweight and have a high risk for diabetes, consider being tested at a younger age or more often. Preventing infection Hepatitis B  If you have a higher risk for hepatitis B, you should be screened for this virus. You are considered at high risk for hepatitis B if: ? You were born in a country where hepatitis B is common. Ask your health care provider which countries are considered high risk. ? Your parents were born in a high-risk country, and you have not been immunized against hepatitis B (hepatitis B vaccine). ? You have HIV or AIDS. ? You use needles to inject street drugs. ? You live with someone who has hepatitis B. ? You have had sex with someone who has hepatitis B. ? You get hemodialysis treatment. ? You take certain medicines for conditions, including cancer, organ transplantation, and autoimmune conditions.  Hepatitis C  Blood testing is recommended for: ? Everyone born from [birth year] through 1967. ? Anyone with known risk factors for hepatitis C.  Sexually transmitted infections (STIs)  You should be screened for sexually transmitted infections (STIs) including gonorrhea and chlamydia if: ? You are  sexually active and are younger than 64 years of age. ? You are older than 64 years of age and your health care provider tells you that you are at risk for this type of infection. ? Your sexual activity has changed since you were last screened and you are at an increased risk for chlamydia or gonorrhea. Ask your health care provider if you are at risk.  If you do not have HIV, but are at risk, it may be recommended that you take a prescription medicine daily to prevent HIV infection. This is called  pre-exposure prophylaxis (PrEP). You are considered at risk if: ? You are sexually active and do not regularly use condoms or know the HIV status of your partner(s). ? You take drugs by injection. ? You are sexually active with a partner who has HIV.  Talk with your health care provider about whether you are at high risk of being infected with HIV. If you choose to begin PrEP, you should first be tested for HIV. You should then be tested every 3 months for as long as you are taking PrEP. Pregnancy  If you are premenopausal and you may become pregnant, ask your health care provider about preconception counseling.  If you may become pregnant, take 400 to 800 micrograms (mcg) of folic acid every day.  If you want to prevent pregnancy, talk to your health care provider about birth control (contraception). Osteoporosis and menopause  Osteoporosis is a disease in which the bones lose minerals and strength with aging. This can result in serious bone fractures. Your risk for osteoporosis can be identified using a bone density scan.  If you are 29 years of age or older, or if you are at risk for osteoporosis and fractures, ask your health care provider if you should be screened.  Ask your health care provider whether you should take a calcium or vitamin D supplement to lower your risk for osteoporosis.  Menopause may have certain physical symptoms and risks.  Hormone replacement therapy may reduce some  of these symptoms and risks. Talk to your health care provider about whether hormone replacement therapy is right for you. Follow these instructions at home:  Schedule regular health, dental, and eye exams.  Stay current with your immunizations.  Do not use any tobacco products including cigarettes, chewing tobacco, or electronic cigarettes.  If you are pregnant, do not drink alcohol.  If you are breastfeeding, limit how much and how often you drink alcohol.  Limit alcohol intake to no more than 1 drink per day for nonpregnant women. One drink equals 12 ounces of beer, 5 ounces of wine, or 1 ounces of hard liquor.  Do not use street drugs.  Do not share needles.  Ask your health care provider for help if you need support or information about quitting drugs.  Tell your health care provider if you often feel depressed.  Tell your health care provider if you have ever been abused or do not feel safe at home. This information is not intended to replace advice given to you by your health care provider. Make sure you discuss any questions you have with your health care provider. Document Released: 04/03/2011 Document Revised: 02/24/2016 Document Reviewed: 06/22/2015 Elsevier Interactive Patient Education  [employer].

## 2018-03-12 NOTE — Progress Notes (Signed)
Subjective:    Patient ID: Patricia Greene, female    DOB: Jan 03, 1956, 62 y.o.   MRN: [id number]  HPI She is here for a physical exam.   She has increased stress revolving around caring for her mother, whom she lives with and has multiple medical problems.  She is not been able to exercise regularly.  She is still dealing with her left knee pain.  She takes the Ativan as needed and it does help with her anxiety and on occasion to help her sleep.  Medications and allergies reviewed with patient and updated if appropriate.  Patient Active Problem List   Diagnosis Date Noted  . Unilateral primary osteoarthritis, left knee 02/21/2018  . Chronic pain of left knee 01/23/2018  . Helicobacter pylori gastritis 10/02/2017  . Hx of adenomatous polyp of colon 09/27/2017  . Chronic cholecystitis 06/18/2017  . Lymphadenopathy of right cervical region 04/06/2017  . Closed wedge compression fracture of T11 vertebra ([place]) 03/15/2017  . Reactive airway disease with wheezing 03/14/2017  . Back muscle spasm 10/04/2016  . Abnormal MRI, breast 04/20/2016  . Breast pain, left 04/15/2016  . H/O breast implant 04/15/2016  . Polyarthralgia 10/18/2015  . Osteopenia 02/01/2015  . Allergic rhinitis, cause unspecified 03/11/2011  . Vitamin D deficiency 03/13/2010  . MIGRAINE HEADACHE 03/13/2010  . CAROTID BRUIT 03/13/2010  . Hypothyroidism 03/09/2010  . INSOMNIA, CHRONIC 02/20/2008  . ANXIETY 09/06/2007  . FIBROMYALGIA 09/06/2007  . IRRITABLE BOWEL SYNDROME, HX OF 09/06/2007    Current Outpatient Medications on File Prior to Visit  Medication Sig Dispense Refill  . baclofen (LIORESAL) 20 MG tablet Take 20 mg by mouth 4 (four) times daily as needed for muscle spasms.     Marland Kitchen CALCIUM PO Take 1 tablet by mouth 2 (two) times daily.     . Cholecalciferol (VITAMIN D-3) 5000 units TABS Take 5,000 Units by mouth daily.    Marland Kitchen LORazepam (ATIVAN) 1 MG tablet TAKE 1 TABLET BY MOUTH TWICE DAILY AS NEEDED FOR  ANXIETY 60 tablet 1  . Multiple Vitamins-Minerals (CENTRUM SILVER) tablet Take 1 tablet by mouth daily.      Marland Kitchen thyroid (ARMOUR) 130 MG tablet Take 130 mg by mouth daily.    . traMADol (ULTRAM) 50 MG tablet Take 2 tablets (100 mg total) by mouth every 6 (six) hours as needed. 40 tablet 0   No current facility-administered medications on file prior to visit.     Past Medical History:  Diagnosis Date  . Allergic rhinitis   . Allergy   . Anxiety state, unspecified   . Arthritis    left thumb  . Carotid bruit   . Cervicalgia   . Diverticulosis   . Fibromyalgia    better since breast implants were removed  . GERD (gastroesophageal reflux disease)   . Helicobacter pylori gastritis 10/02/2017   EGD and bx 12/.2018 Quad tx   . History of migraine headaches   . Hypothyroidism    nodular goiter  . IBS (irritable bowel syndrome)   . Insomnia   . Obesity   . Unspecified vitamin D deficiency     Past Surgical History:  Procedure Laterality Date  . BREAST ENHANCEMENT SURGERY    . breast implants removed    . CHOLECYSTECTOMY N/A 06/19/2017   Procedure: LAPAROSCOPIC CHOLECYSTECTOMY WITH INTRAOPERATIVE CHOLANGIOGRAM,REPAIR OF DUODENAL INJURY;  Surgeon: Kieth Brightly Arta Bruce, MD;  Location: [facility];  Service: General;  Laterality: N/A;  . [place], 2004 Sammuel Cooper), 08/09/2011  1999, 2004: diverticulosis  . finger reattachment    . THYROIDECTOMY  1973   right, toxic nodule  . TONSILLECTOMY      Social History   Socioeconomic History  . Marital status: Divorced    Spouse name: Not on file  . Number of children: 0  . Years of education: Not on file  . Highest education level: Not on file  Occupational History  . Occupation: Therapist, sports  Social Needs  . Financial resource strain: Not on file  . Food insecurity:    Worry: Not on file    Inability: Not on file  . Transportation needs:    Medical: Not on file    Non-medical: Not on file  Tobacco Use  . Smoking status: Never Smoker    . Smokeless tobacco: Never Used  Substance and Sexual Activity  . Alcohol use: No    Comment: rare   . Drug use: No  . Sexual activity: Not on file  Lifestyle  . Physical activity:    Days per week: Not on file    Minutes per session: Not on file  . Stress: Not on file  Relationships  . Social connections:    Talks on phone: Not on file    Gets together: Not on file    Attends religious service: Not on file    Active member of club or organization: Not on file    Attends meetings of clubs or organizations: Not on file    Relationship status: Not on file  Other Topics Concern  . Not on file  Social History Narrative   2-3 caffeine drinks daily    Divorced, no children. 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 Asthma Mother   . Breast cancer Mother   . Allergic rhinitis Mother   . Heart disease Mother   . Colon cancer Father 36       died at 61  . Colon polyps Sister   . Esophageal cancer Neg Hx   . Stomach cancer Neg Hx   . Rectal cancer Neg Hx     Review of Systems  Constitutional: Negative for chills and fever.  Eyes: Negative for visual disturbance.  Respiratory: Negative for cough, shortness of breath and wheezing.   Cardiovascular: Positive for leg swelling (occ, mild). Negative for chest pain and palpitations.  Gastrointestinal: Negative for abdominal pain, blood in stool, constipation, diarrhea and nausea.       Rare gerd  Genitourinary: Negative for dysuria and hematuria.  Musculoskeletal: Positive for arthralgias.  Skin: Negative for color change and rash.  Neurological: Positive for headaches (migraines). Negative for dizziness and light-headedness.  Psychiatric/Behavioral: Positive for dysphoric mood. Negative for sleep disturbance. The patient is nervous/anxious.        Objective:   Vitals:   03/13/18 1107  BP: 122/76  Pulse: 69  Resp: 16  Temp: 97.7  F (36.5 C)  SpO2: 98%   Filed Weights   03/13/18 1107  Weight: 208 lb (94.3 kg)   Body mass index is 32.58 kg/m.  Wt Readings from Last 3 Encounters:  03/13/18 208 lb (94.3 kg)  01/22/18 210 lb (95.3 kg)  09/27/17 212 lb (96.2 kg)     Physical Exam Constitutional: She appears well-developed and well-nourished. No distress.  HENT:  Head: Normocephalic and atraumatic.  Right Ear: External ear normal. Normal ear canal and TM Left Ear:  External ear normal.  Normal ear canal and TM Mouth/Throat: Oropharynx is clear and moist.  Eyes: Conjunctivae and EOM are normal.  Neck: Neck supple. No tracheal deviation present. No thyromegaly present.  No carotid bruit  Cardiovascular: Normal rate, regular rhythm and normal heart sounds.   No murmur heard.  No edema. Pulmonary/Chest: Effort normal and breath sounds normal. No respiratory distress. She has no wheezes. She has no rales.  Breast: deferred to Gyn Abdominal: Soft. She exhibits no distension. There is no tenderness.  Lymphadenopathy: She has no cervical adenopathy.  Skin: Skin is warm and dry. She is not diaphoretic.  Psychiatric: She has a normal mood and affect. Her behavior is normal.        Assessment & Plan:   Physical exam: Screening blood work  ordered Immunizations   Discussed shingrix, others up to date Colonoscopy    Up to date  Mammogram   up-to-date Gyn     Up to date  Dexa   Up to date  Eye exams  Up to date  EKG     Last done 03/2017 Exercise  No regular exercise Weight   Advised weight loss Skin    has seen derm - no concerns Substance abuse  none  See Problem List for Assessment and Plan of chronic medical problems.    Follow-up in 1 year

## 2018-03-13 ENCOUNTER — Encounter: Payer: Self-pay | Admitting: Internal Medicine

## 2018-03-13 ENCOUNTER — Other Ambulatory Visit (INDEPENDENT_AMBULATORY_CARE_PROVIDER_SITE_OTHER): Payer: BC Managed Care – PPO

## 2018-03-13 ENCOUNTER — Ambulatory Visit (INDEPENDENT_AMBULATORY_CARE_PROVIDER_SITE_OTHER): Payer: BC Managed Care – PPO | Admitting: Internal Medicine

## 2018-03-13 VITALS — BP 122/76 | HR 69 | Temp 97.7°F | Resp 16 | Wt 208.0 lb

## 2018-03-13 DIAGNOSIS — G43009 Migraine without aura, not intractable, without status migrainosus: Secondary | ICD-10-CM | POA: Diagnosis not present

## 2018-03-13 DIAGNOSIS — R739 Hyperglycemia, unspecified: Secondary | ICD-10-CM | POA: Diagnosis not present

## 2018-03-13 DIAGNOSIS — H35719 Central serous chorioretinopathy, unspecified eye: Secondary | ICD-10-CM | POA: Insufficient documentation

## 2018-03-13 DIAGNOSIS — F419 Anxiety disorder, unspecified: Secondary | ICD-10-CM

## 2018-03-13 DIAGNOSIS — E890 Postprocedural hypothyroidism: Secondary | ICD-10-CM | POA: Diagnosis not present

## 2018-03-13 DIAGNOSIS — M1712 Unilateral primary osteoarthritis, left knee: Secondary | ICD-10-CM

## 2018-03-13 DIAGNOSIS — Z0000 Encounter for general adult medical examination without abnormal findings: Secondary | ICD-10-CM

## 2018-03-13 DIAGNOSIS — M62830 Muscle spasm of back: Secondary | ICD-10-CM

## 2018-03-13 LAB — COMPREHENSIVE METABOLIC PANEL
ALK PHOS: 64 U/L (ref 39–117)
ALT: 13 U/L (ref 0–35)
AST: 15 U/L (ref 0–37)
Albumin: 4.4 g/dL (ref 3.5–5.2)
BILIRUBIN TOTAL: 0.7 mg/dL (ref 0.2–1.2)
BUN: 13 mg/dL (ref 6–23)
CO2: 28 meq/L (ref 19–32)
CREATININE: 0.76 mg/dL (ref 0.40–1.20)
Calcium: 9.8 mg/dL (ref 8.4–10.5)
Chloride: 105 mEq/L (ref 96–112)
GFR: 81.43 mL/min (ref >60.00)
GLUCOSE: 102 mg/dL — AB (ref 70–99)
Potassium: 4 mEq/L (ref 3.5–5.1)
Sodium: 140 mEq/L (ref 135–145)
TOTAL PROTEIN: 6.7 g/dL (ref 6.0–8.3)

## 2018-03-13 LAB — CBC WITH DIFFERENTIAL/PLATELET
BASOS ABS: 0 10*3/uL (ref 0.0–0.1)
Basophils Relative: 0.6 % (ref 0.0–3.0)
EOS ABS: 0 10*3/uL (ref 0.0–0.7)
Eosinophils Relative: 0.8 % (ref 0.0–5.0)
HCT: 40.9 % (ref 36.0–46.0)
Hemoglobin: 13.7 g/dL (ref 12.0–15.0)
LYMPHS ABS: 2.5 10*3/uL (ref 0.7–4.0)
Lymphocytes Relative: 42.4 % (ref 12.0–46.0)
MCHC: 33.4 g/dL (ref 30.0–36.0)
MCV: 93.4 fl (ref 78.0–100.0)
MONO ABS: 0.4 10*3/uL (ref 0.1–1.0)
Monocytes Relative: 6.5 % (ref 3.0–12.0)
NEUTROS ABS: 2.9 10*3/uL (ref 1.4–7.7)
Neutrophils Relative %: 49.7 % (ref 43.0–77.0)
PLATELETS: 201 10*3/uL (ref 150.0–400.0)
RBC: 4.38 Mil/uL (ref 3.87–5.11)
RDW: 13.7 % (ref 11.5–15.5)
WBC: 5.9 10*3/uL (ref 4.0–10.5)

## 2018-03-13 LAB — LIPID PANEL
Cholesterol: 165 mg/dL (ref 0–200)
HDL: 52.7 mg/dL (ref >39.00)
LDL Cholesterol: 94 mg/dL (ref 0–99)
NONHDL: 111.84
Total CHOL/HDL Ratio: 3
Triglycerides: 90 mg/dL (ref 0.0–149.0)
VLDL: 18 mg/dL (ref 0.0–40.0)

## 2018-03-13 LAB — HEMOGLOBIN A1C: HEMOGLOBIN A1C: 5.7 % (ref 4.6–6.5)

## 2018-03-13 MED ORDER — LORAZEPAM 1 MG PO TABS: 1.0000 mg | ORAL_TABLET | Freq: Two times a day (BID) | ORAL | 1 refills | Status: DC | PRN

## 2018-03-13 MED ORDER — BACLOFEN 20 MG PO TABS: 20.0000 mg | ORAL_TABLET | Freq: Four times a day (QID) | ORAL | 5 refills | Status: DC | PRN

## 2018-03-13 MED ORDER — RIZATRIPTAN BENZOATE 5 MG PO TABS: 5.0000 mg | ORAL_TABLET | ORAL | 0 refills | Status: DC | PRN

## 2018-03-13 NOTE — Assessment & Plan Note (Signed)
following with integrative doctor

## 2018-03-13 NOTE — Assessment & Plan Note (Signed)
Taking ativan as needed Take ativan as needed only  Refilled today

## 2018-03-13 NOTE — Assessment & Plan Note (Signed)
She has infrequent migraine headaches.  She has been taking Excedrin migraine, which is effective She is no longer able to take this or any other NSAIDs or too much caffeine due to a retinal bleed She would like to have something that she could take on a rare occasion when she does have her migraines We will try Maxalt 5 mg-can increase up to 10 if needed Discussed potential side effects

## 2018-03-13 NOTE — Assessment & Plan Note (Signed)
Taking baclofen as needed for back spasms Ok to continue Refilled today

## 2018-03-13 NOTE — Assessment & Plan Note (Addendum)
Has seen Dr Ninfa Linden - would like to see Dr Wynelle Link - referred Doing PT

## 2018-03-15 ENCOUNTER — Telehealth (INDEPENDENT_AMBULATORY_CARE_PROVIDER_SITE_OTHER): Payer: Self-pay

## 2018-03-15 NOTE — Telephone Encounter (Signed)
Talked with patient and advised her that she was approved to have SynviscOne injection, left knee.  Patient stated that she would like to hold off on getting injection.   Covered at 100% after $80.00 co-pay [employer] [place] approved, [id number] Valid 03/13/2018- 03/13/2019

## 2018-04-14 NOTE — Progress Notes (Signed)
Subjective:    Patient ID: Patricia Greene, female    DOB: 31-Jul-1956, 62 y.o.   MRN: [id number]  HPI The patient is here for an acute visit.  Left breast pain:  She fell two weeks ago in [place].  She had some mild injuries that have resolved, but she also landed on her left breast and still has pain.  She denies having had any bruising.  The breast is still soft.  The pain is better, but she was concerned that it was persistent.  She has been taking advil, which helps.  The pain is a burning type pain with numbness/tingling.  Initially the pain was throughout the entire breast, but now is focused on the bottom portion of the breast.  It feels like nerve pain.  It is worse with movement.  She did have breast surgery 1 year ago and is concerned that this may have caused damage, but she did call her surgeon and they reassured her that it should not affect surgery that she had.   Medications and allergies reviewed with patient and updated if appropriate.  Patient Active Problem List   Diagnosis Date Noted  . Pain of left breast 04/15/2018  . 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 Unilateral primary osteoarthritis, left knee 02/21/2018  . Chronic pain of left knee 01/23/2018  . Helicobacter pylori gastritis 10/02/2017  . Hx of adenomatous polyp of colon 09/27/2017  . Lymphadenopathy of right cervical region 04/06/2017  . Closed wedge compression fracture of T11 vertebra ([place]) 03/15/2017  . Reactive airway disease with wheezing 03/14/2017  . Back muscle spasm 10/04/2016  . Breast pain, left 04/15/2016  . H/O breast implant 04/15/2016  . Polyarthralgia 10/18/2015  . Osteopenia 02/01/2015  . Allergic rhinitis, cause unspecified 03/11/2011  . Vitamin D deficiency 03/13/2010  . Migraine headache 03/13/2010  . CAROTID BRUIT 03/13/2010  . Hypothyroidism 03/09/2010  . INSOMNIA, CHRONIC 02/20/2008  . Anxiety 09/06/2007  . FIBROMYALGIA 09/06/2007  . IRRITABLE BOWEL SYNDROME, HX OF  09/06/2007    Current Outpatient Medications on File Prior to Visit  Medication Sig Dispense Refill  . baclofen (LIORESAL) 20 MG tablet Take 1 tablet (20 mg total) by mouth 4 (four) times daily as needed for muscle spasms. 90 each 5  . CALCIUM PO Take 1 tablet by mouth 2 (two) times daily.     . Cholecalciferol (VITAMIN D-3) 5000 units TABS Take 5,000 Units by mouth daily.    Marland Kitchen LORazepam (ATIVAN) 1 MG tablet Take 1 tablet (1 mg total) by mouth 2 (two) times daily as needed. for anxiety 60 tablet 1  . Multiple Vitamins-Minerals (CENTRUM SILVER) tablet Take 1 tablet by mouth daily.      . rizatriptan (MAXALT) 5 MG tablet Take 1 tablet (5 mg total) by mouth as needed for migraine. May repeat in 2 hours if needed 10 tablet 0  . thyroid (ARMOUR) 130 MG tablet Take 130 mg by mouth daily.    . traMADol (ULTRAM) 50 MG tablet Take 2 tablets (100 mg total) by mouth every 6 (six) hours as needed. 40 tablet 0   No current facility-administered medications on file prior to visit.     Past Medical History:  Diagnosis Date  . Allergic rhinitis   . Allergy   . Anxiety state, unspecified   . Arthritis    left thumb  . Carotid bruit   . Cervicalgia   . Diverticulosis   . Fibromyalgia    better since breast implants  were removed  . GERD (gastroesophageal reflux disease)   . Helicobacter pylori gastritis 10/02/2017   EGD and bx 12/.2018 Quad tx   . History of migraine headaches   . Hypothyroidism    nodular goiter  . IBS (irritable bowel syndrome)   . Insomnia   . Obesity   . Unspecified vitamin D deficiency     Past Surgical History:  Procedure Laterality Date  . BREAST ENHANCEMENT SURGERY    . breast implants removed    . CHOLECYSTECTOMY N/A 06/19/2017   Procedure: LAPAROSCOPIC CHOLECYSTECTOMY WITH INTRAOPERATIVE CHOLANGIOGRAM,REPAIR OF DUODENAL INJURY;  Surgeon: Kieth Brightly, Arta Bruce, MD;  Location: [facility];  Service: General;  Laterality: N/A;  . [place], 2004 Sammuel Cooper),  08/09/2011   1999, 2004: diverticulosis  . finger reattachment    . THYROIDECTOMY  1973   right, toxic nodule  . TONSILLECTOMY      Social History   Socioeconomic History  . Marital status: Divorced    Spouse name: Not on file  . Number of children: 0  . Years of education: Not on file  . Highest education level: Not on file  Occupational History  . Occupation: Therapist, sports  Social Needs  . Financial resource strain: Not on file  . Food insecurity:    Worry: Not on file    Inability: Not on file  . Transportation needs:    Medical: Not on file    Non-medical: Not on file  Tobacco Use  . Smoking status: Never Smoker  . Smokeless tobacco: Never Used  Substance and Sexual Activity  . Alcohol use: No    Comment: rare   . Drug use: No  . Sexual activity: Not on file  Lifestyle  . Physical activity:    Days per week: Not on file    Minutes per session: Not on file  . Stress: Not on file  Relationships  . Social connections:    Talks on phone: Not on file    Gets together: Not on file    Attends religious service: Not on file    Active member of club or organization: Not on file    Attends meetings of clubs or organizations: Not on file    Relationship status: Not on file  Other Topics Concern  . Not on file  Social History Narrative   2-3 caffeine drinks daily    Divorced, no children. 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 Asthma Mother   . Breast cancer Mother   . Allergic rhinitis Mother   . Heart disease Mother   . Colon cancer Father 18       died at 74  . Colon polyps Sister   . Esophageal cancer Neg Hx   . Stomach cancer Neg Hx   . Rectal cancer Neg Hx     Review of Systems  Constitutional: Negative for chills and fever.  Respiratory: Negative for cough and shortness of breath.        No increased pain with deep breaths  Skin: Negative for color change, rash  and wound.       No bruising       Objective:   Vitals:   04/15/18 0812  BP: 126/72  Pulse: 75  Resp: 16  Temp: 98.1 F (36.7 C)  SpO2: 98%   BP Readings from Last 3 Encounters:  04/15/18 126/72  03/13/18 122/76  01/22/18 132/78   Wt Readings from Last 3 Encounters:  04/15/18 213 lb (96.6 kg)  03/13/18 208 lb (94.3 kg)  01/22/18 210 lb (95.3 kg)   Body mass index is 33.36 kg/m.   Physical Exam    Constitutional: Well-developed, well-nourished, nondiaphoretic, no distress Breasts: Left breast with no bruising or skin changes, nipple slightly inverted, breast is soft and no evidence of hematoma, mild tenderness lower end of breast, no axillary lymphadenopathy, no discrete lumps Chest wall: No rib tenderness with palpation, no pain with deep breaths Cardiovascular/extremities: No edema     Assessment & Plan:    See Problem List for Assessment and Plan of chronic medical problems.

## 2018-04-15 ENCOUNTER — Encounter: Payer: Self-pay | Admitting: Internal Medicine

## 2018-04-15 ENCOUNTER — Ambulatory Visit: Payer: BC Managed Care – PPO | Admitting: Internal Medicine

## 2018-04-15 VITALS — BP 126/72 | HR 75 | Temp 98.1°F | Resp 16 | Wt 213.0 lb

## 2018-04-15 DIAGNOSIS — N644 Mastodynia: Secondary | ICD-10-CM | POA: Insufficient documentation

## 2018-04-15 NOTE — Patient Instructions (Signed)
Continue the advil 800 mg and let me know if you want to try the gabapentin.

## 2018-04-15 NOTE — Assessment & Plan Note (Signed)
Pain in left breast persistent for 2 weeks after fall No bruising/hematoma and skin appears normal, no discrete masses or lumps.  No evidence of rib injury Pain is improving with ibuprofen She describes the pain is more of a nerve type pain Discussed trial of gabapentin to see if that helps, but she would like to hold off on that-she will call if she wants to try it For now continue Advil and call if pain does not completely resolve

## 2018-05-11 ENCOUNTER — Other Ambulatory Visit: Payer: Self-pay | Admitting: Internal Medicine

## 2018-05-13 NOTE — Telephone Encounter (Signed)
[place] Controlled Substance Database checked. Last filled on 03/13/18

## 2018-06-08 IMAGING — RF DG UGI W/ GASTROGRAFIN
7 of 21 series · 7 of 24 positions shown · IV contrast (iopamidol)
Comparison: Cholangiogram of 06/19/2017.

CLINICAL DATA: Status post cholecystectomy 1 day ago. Anterior
duodenal injury during surgery. Evaluate for leak.

EXAM:
WATER SOLUBLE UPPER GI SERIES
TECHNIQUE: Single-column upper GI series was performed using water soluble
contrast.
CONTRAST:  150mL HZKPOK-011 IOPAMIDOL (HZKPOK-011) INJECTION 61%

[Series 1: t abdomen supine · 0.15mm/px · 1 of 1 slices shown]
[im 1/1]
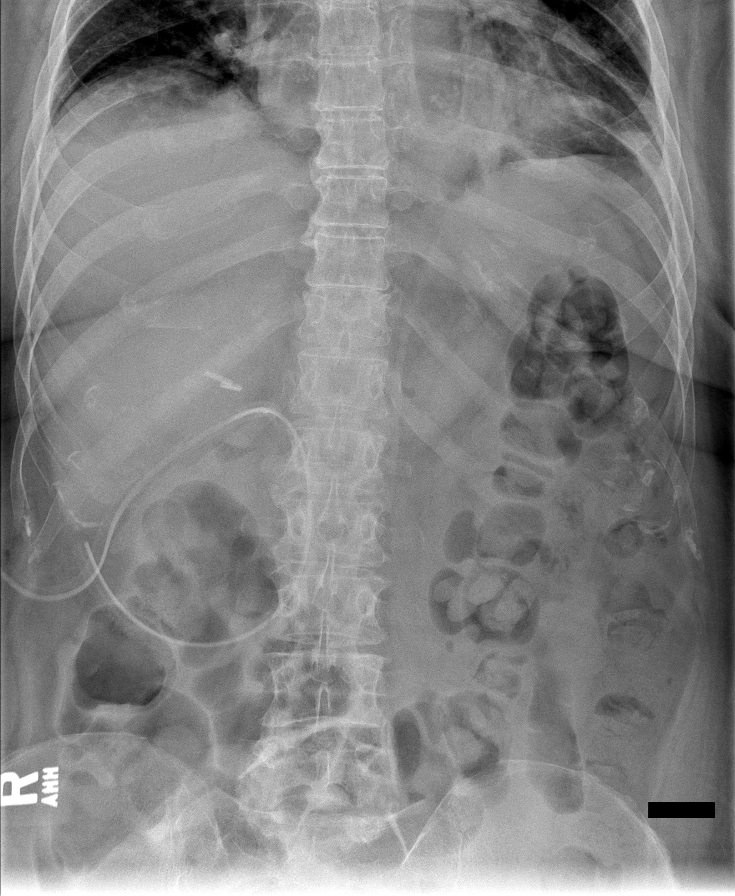

[Series 2: cp_standard · 0.26mm/px · 1 of 1 slices shown (1 of 6)]
[im 1/1]
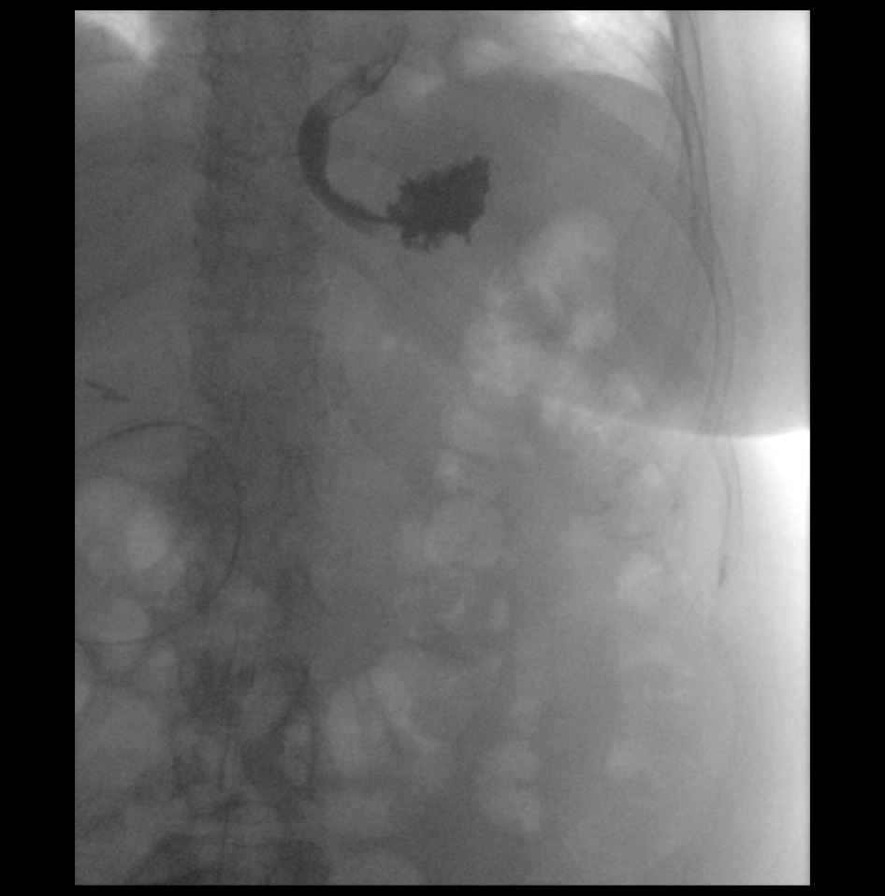

[Series 3: cp_standard · 0.27mm/px · 1 of 1 slices shown (2 of 6)]
[im 1/1]
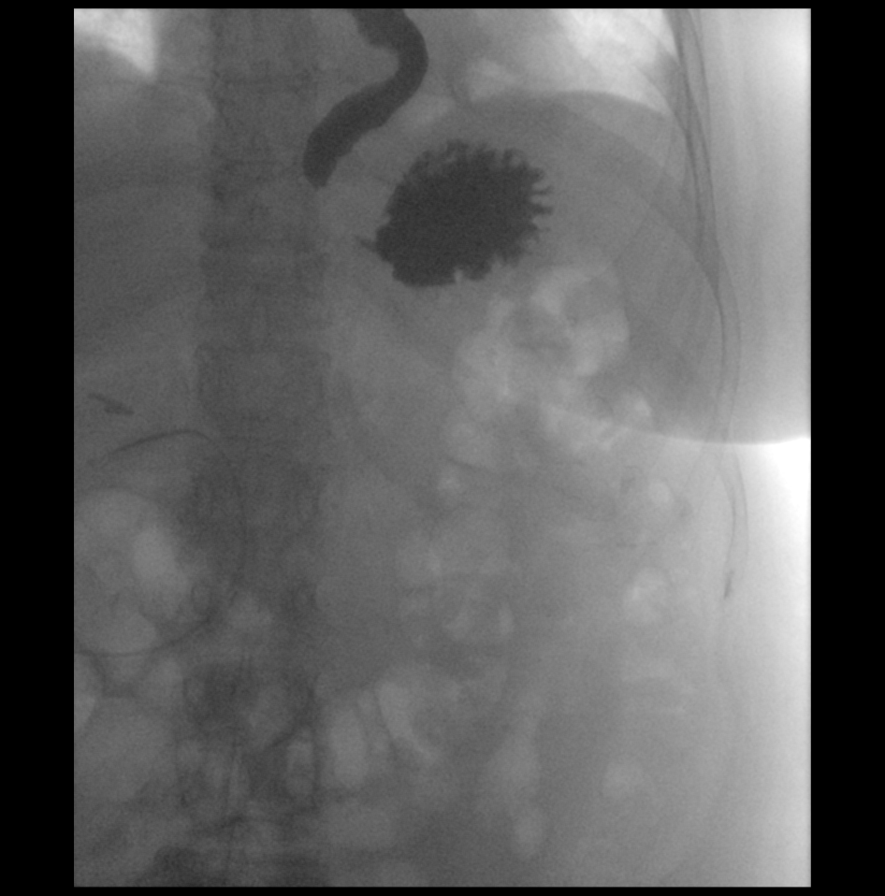

[Series 4: cp_standard · 0.27mm/px · 1 of 1 slices shown (3 of 6)]
[im 1/1]
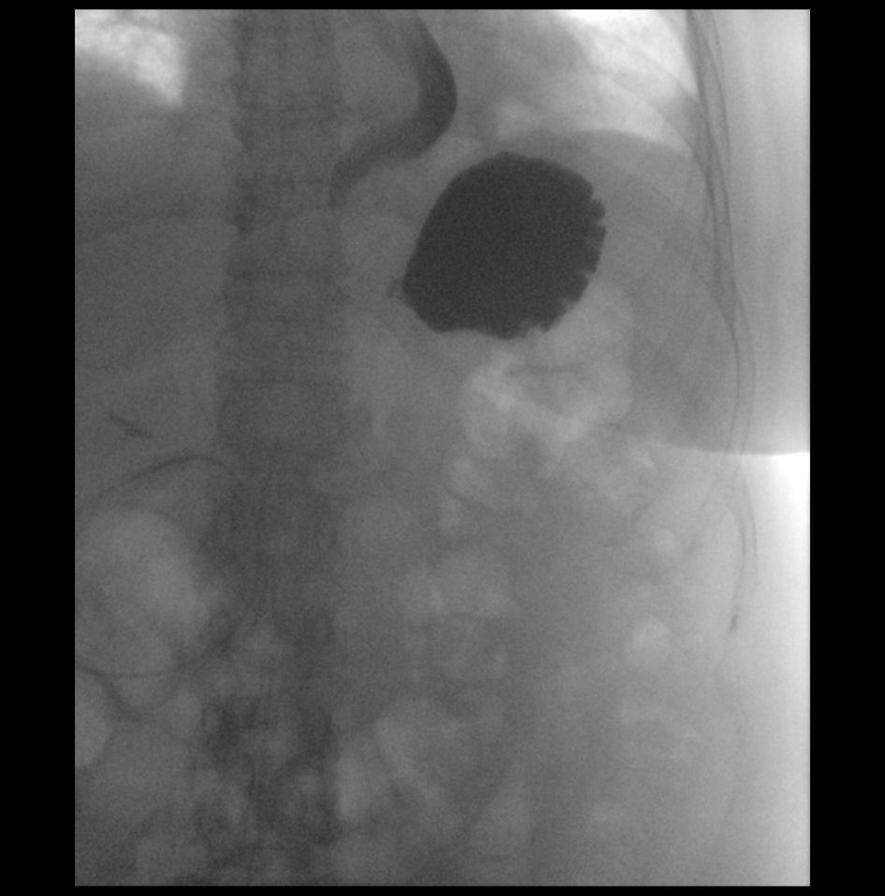

[Series 5: cp_standard · 0.27mm/px · 1 of 1 slices shown (4 of 6)]
[im 1/1]
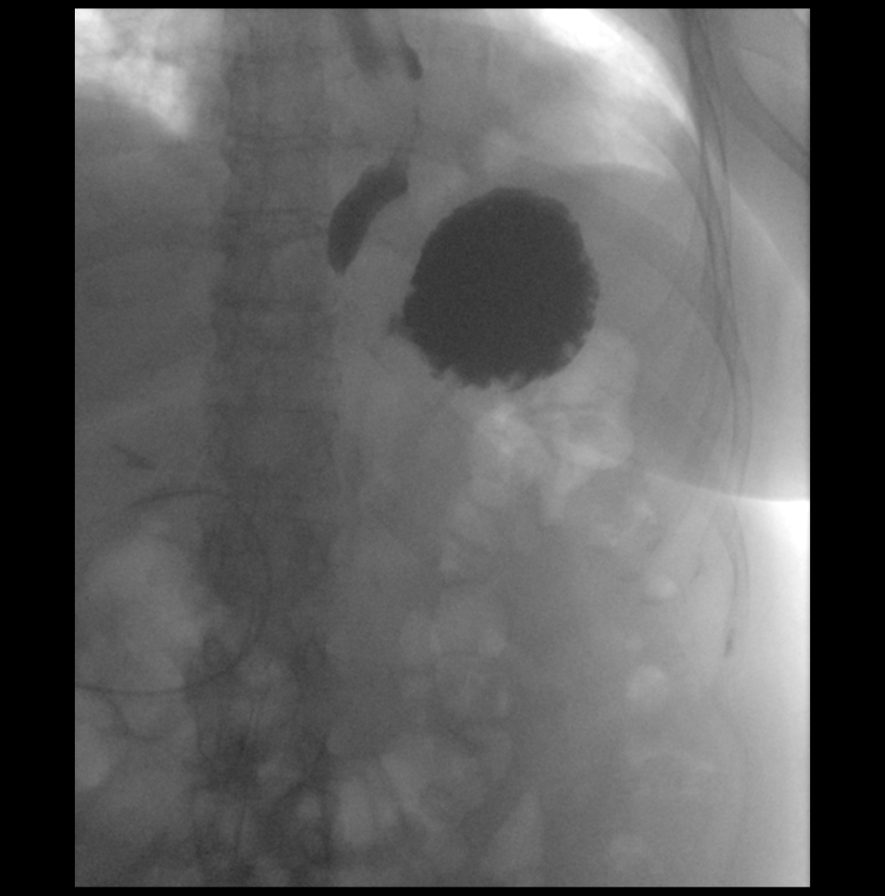

[Series 6: cp_standard · 0.27mm/px · 1 of 1 slices shown (5 of 6)]
[im 1/1]
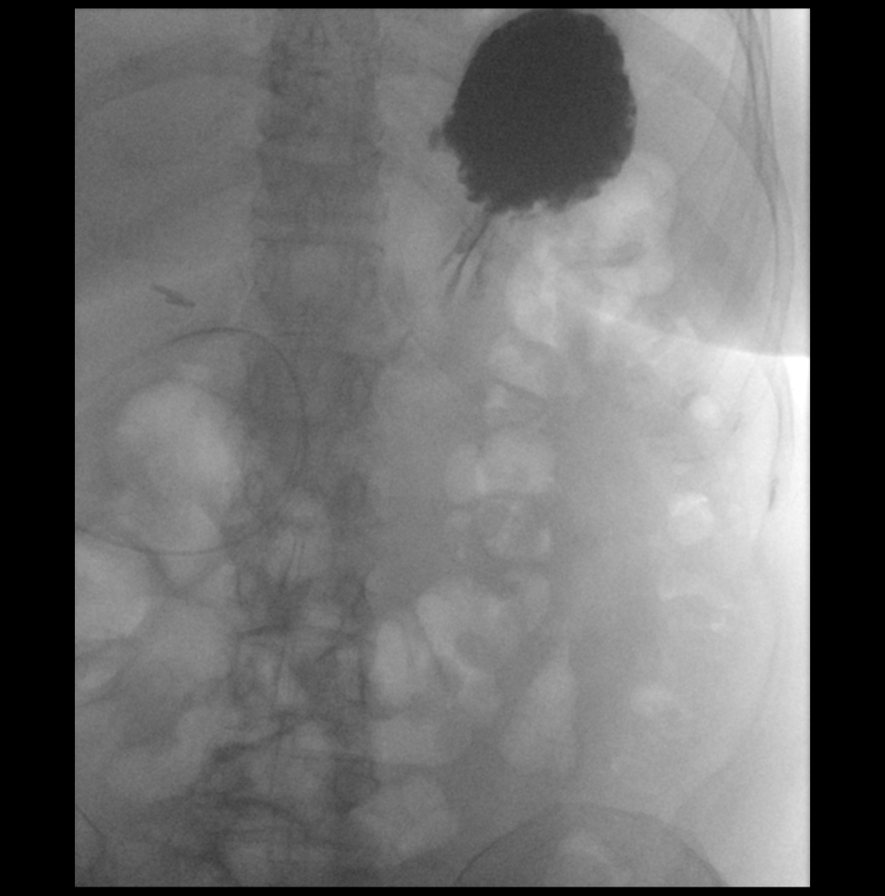

[Series 7: cp_standard · 0.27mm/px · 1 of 1 slices shown (6 of 6)]
[im 1/1]
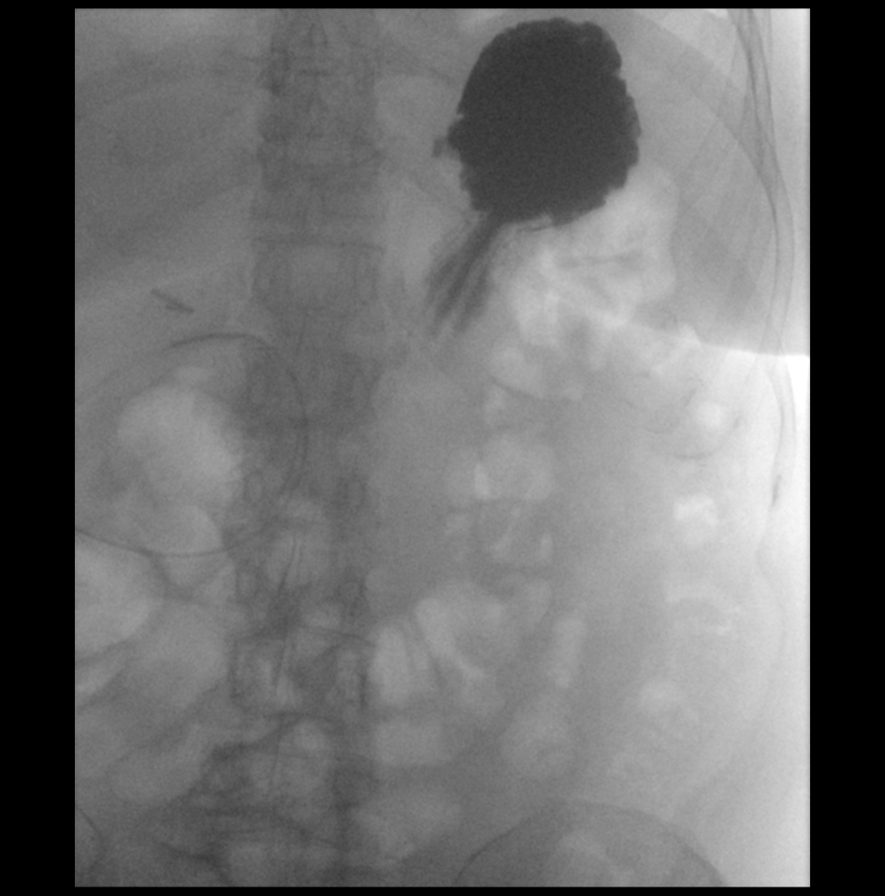

[7 of 24 positions shown; findings below may reference images not displayed]

Ultrasound of 06/15/2017.

FLUOROSCOPY TIME:  Fluoroscopy Time:  9 minutes and 48 seconds

Radiation Exposure Index (if provided by the fluoroscopic device):
203 mGy

Number of Acquired Spot Images: 6
FINDINGS: Preprocedure scout film demonstrates cholecystectomy clips.
Non-obstructive bowel gas pattern. Right upper quadrant surgical
drain.

Exam limited by patient postoperative status, recent pain medication
administration, and relative immobility. The patient describes
significant discomfort with attempts at lying right lateral or RPO.

Suboptimal distention of the proximal duodenum. Suspect
postoperative edema in this area. No gross free extravasation of
contrast identified. No increased density within the surgical drain
to suggest contrast within. Contrast medial to the descending
duodenum likely represents a periampullary duodenal diverticulum.
Example series 16.
IMPRESSION: 1. Mildly limited exam, with limitations detailed above.
2. Given this factor, no evidence of large volume duodenal leak to
suggest residual perforation.
3. Depending on clinical concern, exam could be repeated when
patient has greater mobility and possibly using barium in addition
to omni.

## 2018-08-27 ENCOUNTER — Telehealth: Payer: Self-pay

## 2018-08-27 DIAGNOSIS — M8580 Other specified disorders of bone density and structure, unspecified site: Secondary | ICD-10-CM

## 2018-08-27 NOTE — Telephone Encounter (Signed)
Copied from [place] [phone]. Topic: General - Inquiry >> Aug 27, 2018  8:29 AM Conception Chancy, NT wrote: Reason for CRM: patient is calling and requesting to get her bone density done. She said it will need to be sent to [facility] in [place]. >> Aug 27, 2018  9:02 AM Lonia Skinner [place] A wrote: She had orders but they expired.

## 2018-08-27 NOTE — Telephone Encounter (Signed)
Pt aware.

## 2018-08-27 NOTE — Telephone Encounter (Signed)
Ordered

## 2018-08-30 ENCOUNTER — Ambulatory Visit: Payer: Self-pay

## 2018-08-30 NOTE — Telephone Encounter (Signed)
Incoming call from Patient with a complaint of a warm pink area posterior ankle.  Patient states that around the beginning of October Patient was at the [facility] classic fair.   The back of her ankle was hit by her Mothers wheelchair.  Patient has notice that during the night the area is is swollen,to about 2 quarters during the day it has swollen from  to palm size. Patient denies pain.   Patient states the area is warm and pink and hard.  States she can walk and bear weight.  Denies break in the skin. Reports bronchitis sx. But mainly concerned about back of ankle.  Scheduled for an appointment for 09/03/18 at 745.  Patient voiced understanding.  Reviewed care advice, Patient voiced understanding.  Provided an appointment for 09/03/18 @0745am  with Dr. Billey Gosling.  Patient voiced understanding and gratitude.   Reason for Disposition . Minor injury or pain from direct blow  Answer Assessment - Initial Assessment Questions 1. MECHANISM: "How did the injury happen?" (e.g., twisting injury, direct blow)      Hit in the leg by wheel chair 2. ONSET: "When did the injury happen?" (Minutes or hours ago)       First  Of October 3. LOCATION: "Where is the injury located?"      Above posterior ankle 4. APPEARANCE of INJURY: "What does the injury look like?"  (e.g., deformity of leg)     Pink warm hard  5. SEVERITY: "Can you put weight on that leg?" "Can you walk?"       Can bear weight 6. SIZE: For cuts, bruises, or swelling, ask: "How large is it?" (e.g., inches or centimeters)      Size of 2 quarter, in the evening increases to the size of palm 7. PAIN: "Is there pain?" If so, ask: "How bad is the pain?"  (Scale 1-10; or mild, moderate, severe)     denies 8. TETANUS: For any breaks in the skin, ask: "When was the last tetanus booster?"     na 9. OTHER SYMPTOMS: "Do you have any other symptoms?"       Something like bronchitis, chills off and on. Nothing related to my leg10. PREGNANCY: "Is there any chance  you are pregnant?" "When was your last menstrual period?"  Protocols used: LEG INJURY-A-AH

## 2018-09-02 NOTE — Progress Notes (Deleted)
Subjective:    Patient ID: Patricia Greene, female    DOB: August 16, 1956, 62 y.o.   MRN: [id number]  HPI The patient is here for an acute visit.   Bruised ankle, swells during the day:    Medications and allergies reviewed with patient and updated if appropriate.  Patient Active Problem List   Diagnosis Date Noted  . Pain of left breast 04/15/2018  . 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 Unilateral primary osteoarthritis, left knee 02/21/2018  . Chronic pain of left knee 01/23/2018  . Helicobacter pylori gastritis 10/02/2017  . Hx of adenomatous polyp of colon 09/27/2017  . Lymphadenopathy of right cervical region 04/06/2017  . Closed wedge compression fracture of T11 vertebra ([place]) 03/15/2017  . Reactive airway disease with wheezing 03/14/2017  . Back muscle spasm 10/04/2016  . Breast pain, left 04/15/2016  . H/O breast implant 04/15/2016  . Polyarthralgia 10/18/2015  . Osteopenia 02/01/2015  . Allergic rhinitis, cause unspecified 03/11/2011  . Vitamin D deficiency 03/13/2010  . Migraine headache 03/13/2010  . CAROTID BRUIT 03/13/2010  . Hypothyroidism 03/09/2010  . INSOMNIA, CHRONIC 02/20/2008  . Anxiety 09/06/2007  . FIBROMYALGIA 09/06/2007  . IRRITABLE BOWEL SYNDROME, HX OF 09/06/2007    Current Outpatient Medications on File Prior to Visit  Medication Sig Dispense Refill  . baclofen (LIORESAL) 20 MG tablet Take 1 tablet (20 mg total) by mouth 4 (four) times daily as needed for muscle spasms. 90 each 5  . CALCIUM PO Take 1 tablet by mouth 2 (two) times daily.     . Cholecalciferol (VITAMIN D-3) 5000 units TABS Take 5,000 Units by mouth daily.    Marland Kitchen LORazepam (ATIVAN) 1 MG tablet Take 1 tablet (1 mg total) by mouth 2 (two) times daily as needed. for anxiety 60 tablet 1  . LORazepam (ATIVAN) 1 MG tablet TAKE 1 TABLET BY MOUTH TWICE DAILY AS NEEDED FOR ANXIETY 60 tablet 1  . Multiple Vitamins-Minerals (CENTRUM SILVER) tablet Take 1 tablet by mouth daily.      .  rizatriptan (MAXALT) 5 MG tablet Take 1 tablet (5 mg total) by mouth as needed for migraine. May repeat in 2 hours if needed 10 tablet 0  . thyroid (ARMOUR) 130 MG tablet Take 130 mg by mouth daily.    . traMADol (ULTRAM) 50 MG tablet Take 2 tablets (100 mg total) by mouth every 6 (six) hours as needed. 40 tablet 0  . traZODone (DESYREL) 50 MG tablet TAKE 1/2 TO 1 (ONE-HALF TO ONE) TABLET BY MOUTH ONCE DAILY AT BEDTIME AS NEEDED FOR SLEEP 90 tablet 0  . Vitamin D, Ergocalciferol, (DRISDOL) 50000 units CAPS capsule TAKE 1 CAPSULE BY MOUTH ONCE A WEEK 12 capsule 2   No current facility-administered medications on file prior to visit.     Past Medical History:  Diagnosis Date  . Allergic rhinitis   . Allergy   . Anxiety state, unspecified   . Arthritis    left thumb  . Carotid bruit   . Cervicalgia   . Diverticulosis   . Fibromyalgia    better since breast implants were removed  . GERD (gastroesophageal reflux disease)   . Helicobacter pylori gastritis 10/02/2017   EGD and bx 12/.2018 Quad tx   . History of migraine headaches   . Hypothyroidism    nodular goiter  . IBS (irritable bowel syndrome)   . Insomnia   . Obesity   . Unspecified vitamin D deficiency     Past Surgical History:  Procedure Laterality Date  . BREAST ENHANCEMENT SURGERY    . breast implants removed    . CHOLECYSTECTOMY N/A 06/19/2017   Procedure: LAPAROSCOPIC CHOLECYSTECTOMY WITH INTRAOPERATIVE CHOLANGIOGRAM,REPAIR OF DUODENAL INJURY;  Surgeon: Kieth Brightly, Arta Bruce, MD;  Location: [facility];  Service: General;  Laterality: N/A;  . [place], 2004 Sammuel Cooper), 08/09/2011   1999, 2004: diverticulosis  . finger reattachment    . THYROIDECTOMY  1973   right, toxic nodule  . TONSILLECTOMY      Social History   Socioeconomic History  . Marital status: Divorced    Spouse name: Not on file  . Number of children: 0  . Years of education: Not on file  . Highest education level: Not on file  Occupational  History  . Occupation: Therapist, sports  Social Needs  . Financial resource strain: Not on file  . Food insecurity:    Worry: Not on file    Inability: Not on file  . Transportation needs:    Medical: Not on file    Non-medical: Not on file  Tobacco Use  . Smoking status: Never Smoker  . Smokeless tobacco: Never Used  Substance and Sexual Activity  . Alcohol use: No    Comment: rare   . Drug use: No  . Sexual activity: Not on file  Lifestyle  . Physical activity:    Days per week: Not on file    Minutes per session: Not on file  . Stress: Not on file  Relationships  . Social connections:    Talks on phone: Not on file    Gets together: Not on file    Attends religious service: Not on file    Active member of club or organization: Not on file    Attends meetings of clubs or organizations: Not on file    Relationship status: Not on file  Other Topics Concern  . Not on file  Social History Narrative   2-3 caffeine drinks daily    Divorced, no children. 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 Asthma Mother   . Breast cancer Mother   . Allergic rhinitis Mother   . Heart disease Mother   . Colon cancer Father 17       died at 49  . Colon polyps Sister   . Esophageal cancer Neg Hx   . Stomach cancer Neg Hx   . Rectal cancer Neg Hx     Review of Systems     Objective:  There were no vitals filed for this visit. BP Readings from Last 3 Encounters:  04/15/18 126/72  03/13/18 122/76  01/22/18 132/78   Wt Readings from Last 3 Encounters:  04/15/18 213 lb (96.6 kg)  03/13/18 208 lb (94.3 kg)  01/22/18 210 lb (95.3 kg)   There is no height or weight on file to calculate BMI.   Physical Exam         Assessment & Plan:    See Problem List for Assessment and Plan of chronic medical problems.

## 2018-09-03 ENCOUNTER — Ambulatory Visit: Payer: BC Managed Care – PPO | Admitting: Internal Medicine

## 2018-09-06 ENCOUNTER — Encounter: Payer: Self-pay | Admitting: Internal Medicine

## 2018-09-06 ENCOUNTER — Ambulatory Visit: Payer: BC Managed Care – PPO | Admitting: Internal Medicine

## 2018-09-06 DIAGNOSIS — R21 Rash and other nonspecific skin eruption: Secondary | ICD-10-CM | POA: Diagnosis not present

## 2018-09-06 MED ORDER — SULFAMETHOXAZOLE-TRIMETHOPRIM 800-160 MG PO TABS: 1.0000 | ORAL_TABLET | Freq: Two times a day (BID) | ORAL | 0 refills | Status: DC

## 2018-09-06 NOTE — Assessment & Plan Note (Signed)
Suspect more residual from likely hematoma however given that she states progressing in terms of size and redness will treat for possible cellulitis. Rx for bactrim 1 week. If persistent will get [place] to rule out vascular leaking.
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
## 2018-09-06 NOTE — Patient Instructions (Signed)
We have sent in bactrim to take 1 pill twice a day for 1 week.   If not better call us and we will get an ultrasound of the area.
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
## 2018-09-20 ENCOUNTER — Encounter: Payer: Self-pay | Admitting: Internal Medicine

## 2018-09-20 ENCOUNTER — Ambulatory Visit: Payer: BC Managed Care – PPO | Admitting: Internal Medicine

## 2018-09-20 DIAGNOSIS — T148XXA Other injury of unspecified body region, initial encounter: Secondary | ICD-10-CM

## 2018-09-20 NOTE — Patient Instructions (Addendum)
The area of concern on your leg is likely a hematoma.  I would just recommend symptomatic treatment.    You can try heat.  Wearing a compression sock during the day.  Elevate your leg when sitting.

## 2018-09-20 NOTE — Assessment & Plan Note (Signed)
History and exam consistent with a hematoma No evidence of an infection  Symptoms mild Can apply heat Compression socks during the day Call if no improvement

## 2019-01-18 ENCOUNTER — Other Ambulatory Visit: Payer: Self-pay | Admitting: Internal Medicine

## 2019-01-20 NOTE — Telephone Encounter (Signed)
Last refill was 10/09/18 Last OV 03/14/19 Next OV N/A

## 2019-02-04 DIAGNOSIS — H35051 Retinal neovascularization, unspecified, right eye: Secondary | ICD-10-CM | POA: Diagnosis not present

## 2019-03-11 ENCOUNTER — Telehealth: Payer: Self-pay | Admitting: Internal Medicine

## 2019-03-11 NOTE — Telephone Encounter (Signed)
Requesting annual physical. Scheduled for June 17 at 10:30a.

## 2019-03-17 DIAGNOSIS — E669 Obesity, unspecified: Secondary | ICD-10-CM | POA: Insufficient documentation

## 2019-03-17 NOTE — Patient Instructions (Addendum)
Health Maintenance  Topic Date Due  . DEXA SCAN  12/22/2018  . PAP SMEAR-Modifier  01/18/2019  . PNA vac Low Risk Adult (1 of 2 - PCV13) 03/17/2020 (Originally 03/02/2019)  . INFLUENZA VACCINE  05/03/2019  . MAMMOGRAM  03/04/2020  . TETANUS/TDAP  11/30/2020  . COLONOSCOPY  09/27/2022  . Hepatitis C Screening  Completed  . HIV Screening  Completed     Patricia Greene , Thank you for taking time to come for your Medicare Wellness Visit. I appreciate your ongoing commitment to your health goals. Please review the following plan we discussed and let me know if I can assist you in the future.   These are the goals we discussed: Goals   Once your back is better, work on increasing exercise and weight loss     This is a list of the screening recommended for you and due dates:  Health Maintenance  Topic Date Due  . DEXA scan (bone density measurement)  12/22/2018  . Pap Smear  01/18/2019  . Pneumonia vaccines (1 of 2 - PCV13) 03/17/2020*  . Flu Shot  05/03/2019  . Mammogram  03/04/2020  . Tetanus Vaccine  11/30/2020  . Colon Cancer Screening  09/27/2022  .  Hepatitis C: One time screening is recommended by Center for Disease Control  (CDC) for  adults born from [birth year] through 1967.   Completed  . HIV Screening  Completed  *Topic was postponed. The date shown is not the original due date.    Tests ordered today. Your results will be released to [place] (or called to you) after review, usually within 72hours after test completion. If any changes need to be made, you will be notified at that same time.  All other Health Maintenance issues reviewed.   All recommended immunizations and age-appropriate screenings are up-to-date or discussed.  No immunizations administered today.   An EKG was done.   Medications reviewed and updated.  Changes include :   none   Please followup in one year   Health Maintenance, Female Adopting a healthy lifestyle and getting preventive care can go a long  way to promote health and wellness. Talk with your health care provider about what schedule of regular examinations is right for you. This is a good chance for you to check in with your provider about disease prevention and staying healthy. In between checkups, there are plenty of things you can do on your own. Experts have done a lot of research about which lifestyle changes and preventive measures are most likely to keep you healthy. Ask your health care provider for more information. Weight and diet Eat a healthy diet  Be sure to include plenty of vegetables, fruits, low-fat dairy products, and lean protein.  Do not eat a lot of foods high in solid fats, added sugars, or salt.  Get regular exercise. This is one of the most important things you can do for your health. ? Most adults should exercise for at least 150 minutes each week. The exercise should increase your heart rate and make you sweat (moderate-intensity exercise). ? Most adults should also do strengthening exercises at least twice a week. This is in addition to the moderate-intensity exercise. Maintain a healthy weight  Body mass index (BMI) is a measurement that can be used to identify possible weight problems. It estimates body fat based on height and weight. Your health care provider can help determine your BMI and help you achieve or maintain a healthy weight.  For females 60 years of age and older: ? A BMI below 18.5 is considered underweight. ? A BMI of 18.5 to 24.9 is normal. ? A BMI of 25 to 29.9 is considered overweight. ? A BMI of 30 and above is considered obese. Watch levels of cholesterol and blood lipids  You should start having your blood tested for lipids and cholesterol at 65 years of age, then have this test every 5 years.  You may need to have your cholesterol levels checked more often if: ? Your lipid or cholesterol levels are high. ? You are older than 65 years of age. ? You are at high risk for heart  disease. Cancer screening Lung Cancer  Lung cancer screening is recommended for adults 38-60 years old who are at high risk for lung cancer because of a history of smoking.  A yearly low-dose CT scan of the lungs is recommended for people who: ? Currently smoke. ? Have quit within the past 15 years. ? Have at least a 30-pack-year history of smoking. A pack year is smoking an average of one pack of cigarettes a day for 1 year.  Yearly screening should continue until it has been 15 years since you quit.  Yearly screening should stop if you develop a health problem that would prevent you from having lung cancer treatment. Breast Cancer  Practice breast self-awareness. This means understanding how your breasts normally appear and feel.  It also means doing regular breast self-exams. Let your health care provider know about any changes, no matter how small.  If you are in your 20s or 30s, you should have a clinical breast exam (CBE) by a health care provider every 1-3 years as part of a regular health exam.  If you are 56 or older, have a CBE every year. Also consider having a breast X-ray (mammogram) every year.  If you have a family history of breast cancer, talk to your health care provider about genetic screening.  If you are at high risk for breast cancer, talk to your health care provider about having an MRI and a mammogram every year.  Breast cancer gene (BRCA) assessment is recommended for women who have family members with BRCA-related cancers. BRCA-related cancers include: ? Breast. ? Ovarian. ? Tubal. ? Peritoneal cancers.  Results of the assessment will determine the need for genetic counseling and BRCA1 and BRCA2 testing. Cervical Cancer Your health care provider may recommend that you be screened regularly for cancer of the pelvic organs (ovaries, uterus, and vagina). This screening involves a pelvic examination, including checking for microscopic changes to the surface  of your cervix (Pap test). You may be encouraged to have this screening done every 3 years, beginning at age 66.  For women ages 23-65, health care providers may recommend pelvic exams and Pap testing every 3 years, or they may recommend the Pap and pelvic exam, combined with testing for human papilloma virus (HPV), every 5 years. Some types of HPV increase your risk of cervical cancer. Testing for HPV may also be done on women of any age with unclear Pap test results.  Other health care providers may not recommend any screening for nonpregnant women who are considered low risk for pelvic cancer and who do not have symptoms. Ask your health care provider if a screening pelvic exam is right for you.  If you have had past treatment for cervical cancer or a condition that could lead to cancer, you need Pap tests and screening for  cancer for at least 20 years after your treatment. If Pap tests have been discontinued, your risk factors (such as having a new sexual partner) need to be reassessed to determine if screening should resume. Some women have medical problems that increase the chance of getting cervical cancer. In these cases, your health care provider may recommend more frequent screening and Pap tests. Colorectal Cancer  This type of cancer can be detected and often prevented.  Routine colorectal cancer screening usually begins at 65 years of age and continues through 65 years of age.  Your health care provider may recommend screening at an earlier age if you have risk factors for colon cancer.  Your health care provider may also recommend using home test kits to check for hidden blood in the stool.  A small camera at the end of a tube can be used to examine your colon directly (sigmoidoscopy or colonoscopy). This is done to check for the earliest forms of colorectal cancer.  Routine screening usually begins at age 46.  Direct examination of the colon should be repeated every 5-10 years  through 65 years of age. However, you may need to be screened more often if early forms of precancerous polyps or small growths are found. Skin Cancer  Check your skin from head to toe regularly.  Tell your health care provider about any new moles or changes in moles, especially if there is a change in a mole's shape or color.  Also tell your health care provider if you have a mole that is larger than the size of a pencil eraser.  Always use sunscreen. Apply sunscreen liberally and repeatedly throughout the day.  Protect yourself by wearing long sleeves, pants, a wide-brimmed hat, and sunglasses whenever you are outside. Heart disease, diabetes, and high blood pressure  High blood pressure causes heart disease and increases the risk of stroke. High blood pressure is more likely to develop in: ? People who have blood pressure in the high end of the normal range (130-139/85-89 mm Hg). ? People who are overweight or obese. ? People who are African American.  If you are 37-5 years of age, have your blood pressure checked every 3-5 years. If you are 95 years of age or older, have your blood pressure checked every year. You should have your blood pressure measured twice-once when you are at a hospital or clinic, and once when you are not at a hospital or clinic. Record the average of the two measurements. To check your blood pressure when you are not at a hospital or clinic, you can use: ? An automated blood pressure machine at a pharmacy. ? A home blood pressure monitor.  If you are between 63 years and 74 years old, ask your health care provider if you should take aspirin to prevent strokes.  Have regular diabetes screenings. This involves taking a blood sample to check your fasting blood sugar level. ? If you are at a normal weight and have a low risk for diabetes, have this test once every three years after 65 years of age. ? If you are overweight and have a high risk for diabetes, consider  being tested at a younger age or more often. Preventing infection Hepatitis B  If you have a higher risk for hepatitis B, you should be screened for this virus. You are considered at high risk for hepatitis B if: ? You were born in a country where hepatitis B is common. Ask your health care provider which  countries are considered high risk. ? Your parents were born in a high-risk country, and you have not been immunized against hepatitis B (hepatitis B vaccine). ? You have HIV or AIDS. ? You use needles to inject street drugs. ? You live with someone who has hepatitis B. ? You have had sex with someone who has hepatitis B. ? You get hemodialysis treatment. ? You take certain medicines for conditions, including cancer, organ transplantation, and autoimmune conditions. Hepatitis C  Blood testing is recommended for: ? Everyone born from [birth year] through 1967. ? Anyone with known risk factors for hepatitis C. Sexually transmitted infections (STIs)  You should be screened for sexually transmitted infections (STIs) including gonorrhea and chlamydia if: ? You are sexually active and are younger than 65 years of age. ? You are older than 65 years of age and your health care provider tells you that you are at risk for this type of infection. ? Your sexual activity has changed since you were last screened and you are at an increased risk for chlamydia or gonorrhea. Ask your health care provider if you are at risk.  If you do not have HIV, but are at risk, it may be recommended that you take a prescription medicine daily to prevent HIV infection. This is called pre-exposure prophylaxis (PrEP). You are considered at risk if: ? You are sexually active and do not regularly use condoms or know the HIV status of your partner(s). ? You take drugs by injection. ? You are sexually active with a partner who has HIV. Talk with your health care provider about whether you are at high risk of being infected with  HIV. If you choose to begin PrEP, you should first be tested for HIV. You should then be tested every 3 months for as long as you are taking PrEP. Pregnancy  If you are premenopausal and you may become pregnant, ask your health care provider about preconception counseling.  If you may become pregnant, take 400 to 800 micrograms (mcg) of folic acid every day.  If you want to prevent pregnancy, talk to your health care provider about birth control (contraception). Osteoporosis and menopause  Osteoporosis is a disease in which the bones lose minerals and strength with aging. This can result in serious bone fractures. Your risk for osteoporosis can be identified using a bone density scan.  If you are 45 years of age or older, or if you are at risk for osteoporosis and fractures, ask your health care provider if you should be screened.  Ask your health care provider whether you should take a calcium or vitamin D supplement to lower your risk for osteoporosis.  Menopause may have certain physical symptoms and risks.  Hormone replacement therapy may reduce some of these symptoms and risks. Talk to your health care provider about whether hormone replacement therapy is right for you. Follow these instructions at home:  Schedule regular health, dental, and eye exams.  Stay current with your immunizations.  Do not use any tobacco products including cigarettes, chewing tobacco, or electronic cigarettes.  If you are pregnant, do not drink alcohol.  If you are breastfeeding, limit how much and how often you drink alcohol.  Limit alcohol intake to no more than 1 drink per day for nonpregnant women. One drink equals 12 ounces of beer, 5 ounces of wine, or 1 ounces of hard liquor.  Do not use street drugs.  Do not share needles.  Ask your health care provider for help  if you need support or information about quitting drugs.  Tell your health care provider if you often feel depressed.  Tell  your health care provider if you have ever been abused or do not feel safe at home. This information is not intended to replace advice given to you by your health care provider. Make sure you discuss any questions you have with your health care provider. Document Released: 04/03/2011 Document Revised: 02/24/2016 Document Reviewed: 06/22/2015 [employer] Interactive Patient Education  2019 [employer].

## 2019-03-17 NOTE — Progress Notes (Signed)
Subjective:    Patient ID: Patricia Greene, female    DOB: April 02, 1956, 63 y.o.   MRN: [id number]  HPI Here for welcome to medicare wellness exam and follow-up of chronic medical problems.   I have personally reviewed and have noted 1.         The patient's medical and social history 2.         Their use of alcohol, tobacco or illicit drugs 3.         Their current medications and supplements 4.         The patient's functional ability including ADL's, fall risks, home  safety risk and hearing or visual impairment. 5.         Diet and physical activities 6.         Evidence for depression or mood disorders 7.         Care team reviewed  - Gyn - Dr Perlie Gold, family practice and alternative - Dr Eddie North ( thyroid ), ortho - Dr Maureen Ralphs, Eye - [place], Dr Tye Savoy (retinal specialist),    Back pain:  Shoulder blade to hip - gets very tight, especially after standing in the kitchen for a long time. She takes baclofen at night.  She uses heat or ice at night.  She is interested in doing PT.      Insomnia: She takes trazodone as needed.  At times she will take a baclofen if her back is hurting her more lorazepam if she is having increased anxiety.  She will only take 1 of these medications at a time so she is very careful with them.  Overall she feels her sleep is controlled.  Anxiety: She takes the lorazepam as needed only.  She does not feel that she needs any daily medication.  She does not take the medication often.  Migraine headaches: She has very occasional migraines and will take Maxalt as needed.  Medication works well.  Hypothyroidism: She does follow with an integrative doctor for this.  Are there smokers in your home (other than you)? No  Risk Factors Exercise:   no Dietary issues discussed:  Stress eating, poor diet for the past several months.  She knows she needs to make improvements.    Vitamin and supplement use:  No supplements  Opiod use:   N/A Side effects  from medication: Does medications benefits outweigh risks/side effects:   Cardiac risk factors: advanced age, hypertension, hyperlipidemia, and obesity  Depression Screen  Have you felt down, depressed or hopeless? No  Have you felt little interest or pleasure in doing things?  No  Activities of Daily Living In your present state of health, do you have any difficulty performing the following activities?:  Driving? No Managing money?  No Feeding yourself? No Getting from bed to chair? No Climbing a flight of stairs? No Preparing food and eating?: No Bathing or showering? No Getting dressed: No Getting to/using the toilet? No Moving around from place to place: No In the past year have you fallen or had a near fall?: [place] last July 4th - no major injuries   Are you sexually active?  No  Do you have more than one partner?  N/A  Hearing Difficulties: No Do you often ask people to speak up or repeat themselves? No Do you experience ringing or noises in your ears? No Do you have difficulty understanding soft or whispered voices? No Vision:  Any change in vision:  no             Up to date with eye exam:   yes   Memory:  Do you feel that you have a problem with memory? No  Do you often misplace items? No  Do you feel safe at home?  Yes  Cognitive Testing  Alert, Orientated? Yes  Normal Appearance? Yes  Recall of three objects?  Yes  Can perform simple calculations? Yes  Displays appropriate judgment? Yes  Can read the correct time from a watch face? Yes   Advanced Directives have been discussed with the patient? Yes      Medications and allergies reviewed with patient and updated if appropriate.  Patient Active Problem List   Diagnosis Date Noted  . Obesity 03/17/2019  . 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 Unilateral primary osteoarthritis, left knee 02/21/2018  . Chronic pain of left knee 01/23/2018  . Helicobacter pylori gastritis 10/02/2017  .  Hx of adenomatous polyp of colon 09/27/2017  . Lymphadenopathy of right cervical region 04/06/2017  . Closed wedge compression fracture of T11 vertebra ([place]) 03/15/2017  . Reactive airway disease with wheezing 03/14/2017  . Back muscle spasm 10/04/2016  . H/O breast implant 04/15/2016  . Osteopenia 02/01/2015  . Allergic rhinitis, cause unspecified 03/11/2011  . Vitamin D deficiency 03/13/2010  . Migraine headache 03/13/2010  . CAROTID BRUIT 03/13/2010  . Hypothyroidism 03/09/2010  . INSOMNIA, CHRONIC 02/20/2008  . Anxiety 09/06/2007  . FIBROMYALGIA 09/06/2007  . IRRITABLE BOWEL SYNDROME, HX OF 09/06/2007    Current Outpatient Medications on File Prior to Visit  Medication Sig Dispense Refill  . baclofen (LIORESAL) 20 MG tablet Take 1 tablet (20 mg total) by mouth 4 (four) times daily as needed for muscle spasms. 90 each 5  . LORazepam (ATIVAN) 1 MG tablet Take 1 tablet by mouth twice daily as needed for anxiety 60 tablet 0  . rizatriptan (MAXALT) 5 MG tablet Take 1 tablet (5 mg total) by mouth as needed for migraine. May repeat in 2 hours if needed 10 tablet 0  . thyroid (ARMOUR) 130 MG tablet Take 130 mg by mouth daily.    . traZODone (DESYREL) 50 MG tablet TAKE 1/2 TO 1 (ONE-HALF TO ONE) TABLET BY MOUTH ONCE DAILY AT BEDTIME AS NEEDED FOR SLEEP 90 tablet 0   No current facility-administered medications on file prior to visit.     Past Medical History:  Diagnosis Date  . Allergic rhinitis   . Allergy   . Anxiety state, unspecified   . Arthritis    left thumb  . Carotid bruit   . Cervicalgia   . Diverticulosis   . Fibromyalgia    better since breast implants were removed  . GERD (gastroesophageal reflux disease)   . Helicobacter pylori gastritis 10/02/2017   EGD and bx 12/.2018 Quad tx   . History of migraine headaches   . Hypothyroidism    nodular goiter  . IBS (irritable bowel syndrome)   . Insomnia   . Obesity   . Unspecified vitamin D deficiency     Past  Surgical History:  Procedure Laterality Date  . BREAST ENHANCEMENT SURGERY    . breast implants removed    . CHOLECYSTECTOMY N/A 06/19/2017   Procedure: LAPAROSCOPIC CHOLECYSTECTOMY WITH INTRAOPERATIVE CHOLANGIOGRAM,REPAIR OF DUODENAL INJURY;  Surgeon: Kieth Brightly Arta Bruce, MD;  Location: [facility];  Service: General;  Laterality: N/A;  . [place], 2004 Sammuel Cooper), 08/09/2011   1999, 2004:  diverticulosis  . finger reattachment    . THYROIDECTOMY  1973   right, toxic nodule  . TONSILLECTOMY      Social History   Socioeconomic History  . Marital status: Divorced    Spouse name: Not on file  . Number of children: 0  . Years of education: Not on file  . Highest education level: Not on file  Occupational History  . Occupation: Therapist, sports  Social Needs  . Financial resource strain: Not on file  . Food insecurity    Worry: Not on file    Inability: Not on file  . Transportation needs    Medical: Not on file    Non-medical: Not on file  Tobacco Use  . Smoking status: Never Smoker  . Smokeless tobacco: Never Used  Substance and Sexual Activity  . Alcohol use: No    Comment: rare   . Drug use: No  . Sexual activity: Not on file  Lifestyle  . Physical activity    Days per week: Not on file    Minutes per session: Not on file  . Stress: Not on file  Relationships  . Social Herbalist on phone: Not on file    Gets together: Not on file    Attends religious service: Not on file    Active member of club or organization: Not on file    Attends meetings of clubs or organizations: Not on file    Relationship status: Not on file  Other Topics Concern  . Not on file  Social History Narrative   2-3 caffeine drinks daily    Divorced, no children. 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 Asthma Mother   . Breast cancer Mother   . Allergic rhinitis Mother   . Heart disease  Mother   . Colon cancer Father 60       died at 27  . Colon polyps Sister   . Esophageal cancer Neg Hx   . Stomach cancer Neg Hx   . Rectal cancer Neg Hx     Review of Systems  Constitutional: Negative for chills and fever.  HENT: Negative for hearing loss and tinnitus.   Eyes: Negative for visual disturbance.  Respiratory: Negative for cough, shortness of breath and wheezing.   Cardiovascular: Positive for palpitations (with stress - rare). Negative for chest pain and leg swelling.  Gastrointestinal: Negative for abdominal pain, blood in stool, constipation, diarrhea and nausea.       No gerd  Genitourinary: Negative for dysuria and hematuria.  Musculoskeletal: Positive for arthralgias (knees - good right now) and back pain.  Skin: Negative for color change and rash.  Neurological: Positive for headaches (rare migraine, no regular headaches). Negative for dizziness, light-headedness and numbness.  Psychiatric/Behavioral: Positive for suicidal ideas (trazodone prn only). Negative for dysphoric mood. The patient is nervous/anxious (at times).        Objective:   Vitals:   03/18/19 1509  BP: 118/78  Pulse: 79  Resp: 16  Temp: 98.2 F (36.8 C)  SpO2: 98%   Filed Weights   03/18/19 1509  Weight: 209 lb 12.8 oz (95.2 kg)   Body mass index is 32.86 kg/m.  BP Readings from Last 3 Encounters:  03/18/19 118/78  09/20/18 118/72  09/06/18 120/64    Wt Readings from Last 3 Encounters:  03/18/19 209 lb 12.8 oz (95.2  kg)  09/20/18 215 lb (97.5 kg)  09/06/18 210 lb (95.3 kg)    Hearing Screening   125Hz  250Hz  500Hz  1000Hz  2000Hz  3000Hz  4000Hz  6000Hz  8000Hz   Right ear:           Left ear:             Visual Acuity Screening   Right eye Left eye Both eyes  Without correction:     With correction: 20/25 20/15 20/15     Depression screen [facility] 2/9 03/19/2019 09/06/2018  Decreased Interest 0 0  Down, Depressed, Hopeless 0 0  PHQ - 2 Score 0 0  Altered sleeping 1 -  Tired,  decreased energy 1 -  Change in appetite 1 -  Feeling bad or failure about yourself  0 -  Trouble concentrating 0 -  Moving slowly or fidgety/restless 0 -  Suicidal thoughts 0 -  PHQ-9 Score 3 -   Her positive answers from above are generally related to caring for her mother and being a full-time caregiver.  She is not able to take care of herself the way that she would like to, including exercising regularly and eating more healthy.    Physical Exam Constitutional: She appears well-developed and well-nourished. No distress.  HENT:  Head: Normocephalic and atraumatic.  Right Ear: External ear normal. Normal ear canal and TM Left Ear: External ear normal.  Normal ear canal and TM Mouth/Throat: Oropharynx is clear and moist.  Eyes: Conjunctivae and EOM are normal.  Neck: Neck supple. No tracheal deviation present. No thyromegaly present.  No carotid bruit  Cardiovascular: Normal rate, regular rhythm and normal heart sounds.   No murmur heard.  No edema. Pulmonary/Chest: Effort normal and breath sounds normal. No respiratory distress. She has no wheezes. She has no rales.  Breast: deferred   Abdominal: Soft. She exhibits no distension. There is no tenderness. Musculoskeletal: No back pain with palpation-area of concern is lateral aspects of the back on the right and left Lymphadenopathy: She has no cervical adenopathy.  Skin: Skin is warm and dry. She is not diaphoretic.  Psychiatric: She has a normal mood and affect. Her behavior is normal.        Assessment & Plan:   Health Maintenance  Topic Date Due  . DEXA SCAN  12/22/2018  . PNA vac Low Risk Adult (1 of 2 - PCV13) 03/17/2020 (Originally 03/02/2019)  . INFLUENZA VACCINE  05/03/2019  . MAMMOGRAM  03/04/2020  . TETANUS/TDAP  11/30/2020  . PAP SMEAR-Modifier  01/17/2021  . COLONOSCOPY  09/27/2022  . Hepatitis C Screening  Completed  . HIV Screening  Completed    Health Maintenance  Topic Date Due  . DEXA SCAN   12/22/2018  . PNA vac Low Risk Adult (1 of 2 - PCV13) 03/17/2020 (Originally 03/02/2019)  . INFLUENZA VACCINE  05/03/2019  . MAMMOGRAM  03/04/2020  . TETANUS/TDAP  11/30/2020  . PAP SMEAR-Modifier  01/17/2021  . COLONOSCOPY  09/27/2022  . Hepatitis C Screening  Completed  . HIV Screening  Completed     Wellness Exam: Immunizations  prenvar declined,  Discussed shingrix, others up to date Colonoscopy    Up to date  Mammogram    Due - will schedule Gyn   Dr Creed Copper Dexa    Due she will schedule  Eye exams    Up to date  Hearing loss  none Memory concerns/difficulties   none Independent of ADLs  Fully independent Stressed the importance of regular exercise Discussed importance of  sunscreen use and monitoring skin for changes in moles/freckles. Seatbelt use, healthy diet with lots of fruits and veges. EKG today:  NSR at 67 bpm, normal EKG, no change compared to prior EKG from 03/2017  Patient received copy of preventative screening tests/immunizations recommended for the next 5-10 years.     FU in one year

## 2019-03-18 ENCOUNTER — Encounter: Payer: Self-pay | Admitting: Internal Medicine

## 2019-03-18 ENCOUNTER — Other Ambulatory Visit (INDEPENDENT_AMBULATORY_CARE_PROVIDER_SITE_OTHER): Payer: Medicare Other

## 2019-03-18 ENCOUNTER — Other Ambulatory Visit: Payer: Self-pay

## 2019-03-18 ENCOUNTER — Ambulatory Visit (INDEPENDENT_AMBULATORY_CARE_PROVIDER_SITE_OTHER): Payer: Medicare Other | Admitting: Internal Medicine

## 2019-03-18 VITALS — BP 118/78 | HR 79 | Temp 98.2°F | Resp 16 | Ht 67.0 in | Wt 209.8 lb

## 2019-03-18 DIAGNOSIS — Z0000 Encounter for general adult medical examination without abnormal findings: Secondary | ICD-10-CM

## 2019-03-18 DIAGNOSIS — G4700 Insomnia, unspecified: Secondary | ICD-10-CM

## 2019-03-18 DIAGNOSIS — M62830 Muscle spasm of back: Secondary | ICD-10-CM | POA: Diagnosis not present

## 2019-03-18 DIAGNOSIS — Z6832 Body mass index (BMI) 32.0-32.9, adult: Secondary | ICD-10-CM | POA: Diagnosis not present

## 2019-03-18 DIAGNOSIS — R739 Hyperglycemia, unspecified: Secondary | ICD-10-CM | POA: Diagnosis not present

## 2019-03-18 DIAGNOSIS — M546 Pain in thoracic spine: Secondary | ICD-10-CM | POA: Diagnosis not present

## 2019-03-18 DIAGNOSIS — G8929 Other chronic pain: Secondary | ICD-10-CM

## 2019-03-18 DIAGNOSIS — E890 Postprocedural hypothyroidism: Secondary | ICD-10-CM | POA: Diagnosis not present

## 2019-03-18 DIAGNOSIS — M8580 Other specified disorders of bone density and structure, unspecified site: Secondary | ICD-10-CM | POA: Diagnosis not present

## 2019-03-18 DIAGNOSIS — G43009 Migraine without aura, not intractable, without status migrainosus: Secondary | ICD-10-CM | POA: Diagnosis not present

## 2019-03-18 DIAGNOSIS — F419 Anxiety disorder, unspecified: Secondary | ICD-10-CM | POA: Diagnosis not present

## 2019-03-18 DIAGNOSIS — E6609 Other obesity due to excess calories: Secondary | ICD-10-CM | POA: Diagnosis not present

## 2019-03-18 LAB — CBC WITH DIFFERENTIAL/PLATELET
Basophils Absolute: 0 10*3/uL (ref 0.0–0.1)
Basophils Relative: 0.5 % (ref 0.0–3.0)
Eosinophils Absolute: 0.1 10*3/uL (ref 0.0–0.7)
Eosinophils Relative: 1.2 % (ref 0.0–5.0)
HCT: 40 % (ref 36.0–46.0)
Hemoglobin: 13.1 g/dL (ref 12.0–15.0)
Lymphocytes Relative: 47.5 % — ABNORMAL HIGH (ref 12.0–46.0)
Lymphs Abs: 2.9 10*3/uL (ref 0.7–4.0)
MCHC: 32.8 g/dL (ref 30.0–36.0)
MCV: 94.2 fl (ref 78.0–100.0)
Monocytes Absolute: 0.4 10*3/uL (ref 0.1–1.0)
Monocytes Relative: 7.2 % (ref 3.0–12.0)
Neutro Abs: 2.7 10*3/uL (ref 1.4–7.7)
Neutrophils Relative %: 43.6 % (ref 43.0–77.0)
Platelets: 221 10*3/uL (ref 150.0–400.0)
RBC: 4.24 Mil/uL (ref 3.87–5.11)
RDW: 13.5 % (ref 11.5–15.5)
WBC: 6.2 10*3/uL (ref 4.0–10.5)

## 2019-03-18 LAB — COMPREHENSIVE METABOLIC PANEL
ALT: 11 U/L (ref 0–35)
AST: 13 U/L (ref 0–37)
Albumin: 4.2 g/dL (ref 3.5–5.2)
Alkaline Phosphatase: 62 U/L (ref 39–117)
BUN: 11 mg/dL (ref 6–23)
CO2: 29 mEq/L (ref 19–32)
Calcium: 9.7 mg/dL (ref 8.4–10.5)
Chloride: 104 mEq/L (ref 96–112)
Creatinine, Ser: 0.67 mg/dL (ref 0.40–1.20)
GFR: 88.32 mL/min (ref >60.00)
Glucose, Bld: 85 mg/dL (ref 70–99)
Potassium: 3.9 mEq/L (ref 3.5–5.1)
Sodium: 139 mEq/L (ref 135–145)
Total Bilirubin: 0.6 mg/dL (ref 0.2–1.2)
Total Protein: 6.3 g/dL (ref 6.0–8.3)

## 2019-03-18 LAB — LIPID PANEL
Cholesterol: 138 mg/dL (ref 0–200)
HDL: 49.8 mg/dL (ref >39.00)
LDL Cholesterol: 71 mg/dL (ref 0–99)
NonHDL: 88.49
Total CHOL/HDL Ratio: 3
Triglycerides: 86 mg/dL (ref 0.0–149.0)
VLDL: 17.2 mg/dL (ref 0.0–40.0)

## 2019-03-18 LAB — HEMOGLOBIN A1C: Hgb A1c MFr Bld: 5.6 % (ref 4.6–6.5)

## 2019-03-18 NOTE — Assessment & Plan Note (Signed)
Managed by alternative doctor

## 2019-03-18 NOTE — Assessment & Plan Note (Signed)
Takes trazodone prn Takes baclofen at night prn for pain muscular pain Occasionally takes ativan at night Only takes one of these if she takes them

## 2019-03-18 NOTE — Assessment & Plan Note (Signed)
Controlled Ativan prn

## 2019-03-18 NOTE — Assessment & Plan Note (Signed)
Chronic - since after fall last year Baclofen as needed PT ordered

## 2019-03-18 NOTE — Assessment & Plan Note (Signed)
Discussed weight loss Advised regular exercise Improve diet

## 2019-03-18 NOTE — Assessment & Plan Note (Signed)
dexa due - she will schedule Stressed regular exercise Should take calcium and vitamin d

## 2019-03-18 NOTE — Assessment & Plan Note (Addendum)
Occasional migraine Takes maxalt prn continue

## 2019-03-19 ENCOUNTER — Telehealth: Payer: Self-pay

## 2019-03-19 ENCOUNTER — Encounter: Payer: BC Managed Care – PPO | Admitting: Internal Medicine

## 2019-03-19 NOTE — Telephone Encounter (Signed)
Error

## 2019-04-02 ENCOUNTER — Other Ambulatory Visit: Payer: BC Managed Care – PPO | Admitting: Internal Medicine

## 2019-04-02 ENCOUNTER — Encounter: Payer: BC Managed Care – PPO | Admitting: Internal Medicine

## 2019-04-03 ENCOUNTER — Telehealth: Payer: Self-pay

## 2019-04-03 NOTE — Telephone Encounter (Signed)
FYI

## 2019-04-03 NOTE — Telephone Encounter (Signed)
Copied from [place] [id number]. Topic: General - Other >> Apr 02, 2019 11:16 AM Virl Axe D wrote: Reason for CRM: Pt stated that she would like to hold off on PT at the moment due to Covid 19. She will reach back out to Dr. Quay Burow once she is ready to start PT. Please advise.

## 2019-04-22 ENCOUNTER — Other Ambulatory Visit: Payer: Self-pay | Admitting: Internal Medicine

## 2019-04-22 DIAGNOSIS — H35711 Central serous chorioretinopathy, right eye: Secondary | ICD-10-CM | POA: Diagnosis not present

## 2019-04-22 DIAGNOSIS — H43813 Vitreous degeneration, bilateral: Secondary | ICD-10-CM | POA: Diagnosis not present

## 2019-04-22 DIAGNOSIS — H353211 Exudative age-related macular degeneration, right eye, with active choroidal neovascularization: Secondary | ICD-10-CM | POA: Diagnosis not present

## 2019-04-22 DIAGNOSIS — H43392 Other vitreous opacities, left eye: Secondary | ICD-10-CM | POA: Diagnosis not present

## 2019-04-22 DIAGNOSIS — Z1231 Encounter for screening mammogram for malignant neoplasm of breast: Secondary | ICD-10-CM

## 2019-04-24 DIAGNOSIS — N951 Menopausal and female climacteric states: Secondary | ICD-10-CM | POA: Diagnosis not present

## 2019-04-24 DIAGNOSIS — M2550 Pain in unspecified joint: Secondary | ICD-10-CM | POA: Diagnosis not present

## 2019-04-24 DIAGNOSIS — Z1321 Encounter for screening for nutritional disorder: Secondary | ICD-10-CM | POA: Diagnosis not present

## 2019-04-24 DIAGNOSIS — M797 Fibromyalgia: Secondary | ICD-10-CM | POA: Diagnosis not present

## 2019-04-24 DIAGNOSIS — L659 Nonscarring hair loss, unspecified: Secondary | ICD-10-CM | POA: Diagnosis not present

## 2019-04-24 DIAGNOSIS — K589 Irritable bowel syndrome without diarrhea: Secondary | ICD-10-CM | POA: Diagnosis not present

## 2019-04-29 DIAGNOSIS — B9681 Helicobacter pylori [H. pylori] as the cause of diseases classified elsewhere: Secondary | ICD-10-CM | POA: Diagnosis not present

## 2019-04-29 DIAGNOSIS — N951 Menopausal and female climacteric states: Secondary | ICD-10-CM | POA: Diagnosis not present

## 2019-04-29 DIAGNOSIS — E890 Postprocedural hypothyroidism: Secondary | ICD-10-CM | POA: Diagnosis not present

## 2019-04-29 DIAGNOSIS — M2550 Pain in unspecified joint: Secondary | ICD-10-CM | POA: Diagnosis not present

## 2019-04-29 DIAGNOSIS — E6609 Other obesity due to excess calories: Secondary | ICD-10-CM | POA: Diagnosis not present

## 2019-04-29 DIAGNOSIS — M797 Fibromyalgia: Secondary | ICD-10-CM | POA: Diagnosis not present

## 2019-04-29 DIAGNOSIS — K589 Irritable bowel syndrome without diarrhea: Secondary | ICD-10-CM | POA: Diagnosis not present

## 2019-04-29 DIAGNOSIS — L659 Nonscarring hair loss, unspecified: Secondary | ICD-10-CM | POA: Diagnosis not present

## 2019-05-03 ENCOUNTER — Other Ambulatory Visit: Payer: Self-pay | Admitting: Internal Medicine

## 2019-05-05 NOTE — Telephone Encounter (Signed)
Last refill 01/23/19 Last OV 03/18/19 Next OV NA

## 2019-05-13 NOTE — Telephone Encounter (Signed)
Patient wants ativan sent to  [place], [address] [place] [address]  Phone: [phone] Fax: [phone]   Because its cheaper there. She did not pick up the one sent to [facility].  Please notify when resent.

## 2019-05-14 MED ORDER — LORAZEPAM 1 MG PO TABS: 1.0000 mg | ORAL_TABLET | Freq: Two times a day (BID) | ORAL | 0 refills | Status: DC | PRN

## 2019-05-14 NOTE — Telephone Encounter (Signed)
Please advise. Last RF 01/23/19

## 2019-05-14 NOTE — Addendum Note (Signed)
Addended by: Delice Bison E on: 05/14/2019 09:32 AM   Modules accepted: Orders

## 2019-06-03 DIAGNOSIS — Z1231 Encounter for screening mammogram for malignant neoplasm of breast: Secondary | ICD-10-CM | POA: Diagnosis not present

## 2019-06-03 DIAGNOSIS — Z803 Family history of malignant neoplasm of breast: Secondary | ICD-10-CM | POA: Diagnosis not present

## 2019-06-03 LAB — HM MAMMOGRAPHY

## 2019-06-04 ENCOUNTER — Inpatient Hospital Stay: Admission: RE | Admit: 2019-06-04 | Payer: Medicare Other | Source: Ambulatory Visit

## 2019-06-12 ENCOUNTER — Telehealth: Payer: Self-pay

## 2019-06-12 DIAGNOSIS — M85852 Other specified disorders of bone density and structure, left thigh: Secondary | ICD-10-CM

## 2019-06-12 NOTE — Telephone Encounter (Signed)
Copied from [place] [id number]. Topic: General - Other >> Jun 12, 2019  9:42 AM Rainey Pines A wrote: Patient is requesting callback in regrds to having order placed [facility] with [facility]. Best contact [phone]

## 2019-06-12 NOTE — Telephone Encounter (Signed)
ordered

## 2019-06-12 NOTE — Telephone Encounter (Signed)
Pt needs order placed for bone density to the breast center.

## 2019-06-18 DIAGNOSIS — H43813 Vitreous degeneration, bilateral: Secondary | ICD-10-CM | POA: Diagnosis not present

## 2019-06-18 DIAGNOSIS — H2513 Age-related nuclear cataract, bilateral: Secondary | ICD-10-CM | POA: Diagnosis not present

## 2019-06-18 DIAGNOSIS — H43392 Other vitreous opacities, left eye: Secondary | ICD-10-CM | POA: Diagnosis not present

## 2019-06-18 DIAGNOSIS — H353211 Exudative age-related macular degeneration, right eye, with active choroidal neovascularization: Secondary | ICD-10-CM | POA: Diagnosis not present

## 2019-07-09 ENCOUNTER — Other Ambulatory Visit: Payer: Self-pay | Admitting: Internal Medicine

## 2019-07-09 NOTE — Telephone Encounter (Signed)
Last RF 05/18/19 Last OV 03/18/19

## 2019-07-09 NOTE — Telephone Encounter (Signed)
[place] Controlled Database Checked Last filled: 05/18/19 # 60 LOV w/you: 03/18/19 Next appt w/you: None

## 2019-07-09 NOTE — Telephone Encounter (Signed)
Patient called in stating she would like to make sure script gets sent to [facility]. Please advise.

## 2019-07-30 DIAGNOSIS — R29890 Loss of height: Secondary | ICD-10-CM | POA: Diagnosis not present

## 2019-07-30 DIAGNOSIS — M8589 Other specified disorders of bone density and structure, multiple sites: Secondary | ICD-10-CM | POA: Diagnosis not present

## 2019-07-30 DIAGNOSIS — Z8262 Family history of osteoporosis: Secondary | ICD-10-CM | POA: Diagnosis not present

## 2019-07-30 LAB — HM DEXA SCAN

## 2019-07-31 ENCOUNTER — Telehealth: Payer: Self-pay | Admitting: Internal Medicine

## 2019-07-31 NOTE — Telephone Encounter (Signed)
Call her - let her know her bone density shows osteopenia.  Improvement in bone density in the spine.  Her hips are stable.    No need for any changes  - repeat in 2 years.

## 2019-08-01 ENCOUNTER — Encounter: Payer: Self-pay | Admitting: Internal Medicine

## 2019-08-01 NOTE — Telephone Encounter (Signed)
Pt aware of results and expressed understanding.  

## 2019-08-04 DIAGNOSIS — L578 Other skin changes due to chronic exposure to nonionizing radiation: Secondary | ICD-10-CM | POA: Diagnosis not present

## 2019-08-04 DIAGNOSIS — L814 Other melanin hyperpigmentation: Secondary | ICD-10-CM | POA: Diagnosis not present

## 2019-08-04 DIAGNOSIS — D2260 Melanocytic nevi of unspecified upper limb, including shoulder: Secondary | ICD-10-CM | POA: Diagnosis not present

## 2019-08-04 DIAGNOSIS — D2270 Melanocytic nevi of unspecified lower limb, including hip: Secondary | ICD-10-CM | POA: Diagnosis not present

## 2019-08-04 DIAGNOSIS — D225 Melanocytic nevi of trunk: Secondary | ICD-10-CM | POA: Diagnosis not present

## 2019-08-04 DIAGNOSIS — D1801 Hemangioma of skin and subcutaneous tissue: Secondary | ICD-10-CM | POA: Diagnosis not present

## 2019-08-04 DIAGNOSIS — L821 Other seborrheic keratosis: Secondary | ICD-10-CM | POA: Diagnosis not present

## 2019-08-12 ENCOUNTER — Other Ambulatory Visit: Payer: Self-pay | Admitting: Internal Medicine

## 2019-08-12 NOTE — Telephone Encounter (Signed)
Last OV 03/18/19 Next OV NA Last RF 07/11/19

## 2019-08-15 DIAGNOSIS — H353122 Nonexudative age-related macular degeneration, left eye, intermediate dry stage: Secondary | ICD-10-CM | POA: Diagnosis not present

## 2019-08-15 DIAGNOSIS — H353211 Exudative age-related macular degeneration, right eye, with active choroidal neovascularization: Secondary | ICD-10-CM | POA: Diagnosis not present

## 2019-08-15 DIAGNOSIS — H43392 Other vitreous opacities, left eye: Secondary | ICD-10-CM | POA: Diagnosis not present

## 2019-08-15 DIAGNOSIS — H43813 Vitreous degeneration, bilateral: Secondary | ICD-10-CM | POA: Diagnosis not present

## 2019-08-29 ENCOUNTER — Other Ambulatory Visit: Payer: Self-pay | Admitting: Internal Medicine

## 2019-08-29 NOTE — Telephone Encounter (Signed)
Medication Refill - Medication: rizatriptan (MAXALT) 5 MG tablet  Has the patient contacted their pharmacy? Yes - states they have gotten no response from [place] in a week. (Agent: If no, request that the patient contact the pharmacy for the refill.) (Agent: If yes, when and what did the pharmacy advise?)  Preferred Pharmacy (with phone number or street name):  [place], [place] [phone] (Phone) [phone] (Fax)   Agent: Please be advised that RX refills may take up to 3 business days. We ask that you follow-up with your pharmacy. 3

## 2019-08-29 NOTE — Telephone Encounter (Signed)
Requested medication (s) are due for refill today: yes  Requested medication (s) are on the active medication list: yes  Last refill: 03/11/18  Future visit scheduled: no  Notes to clinic:  RX expired    Requested Prescriptions  Pending Prescriptions Disp Refills   rizatriptan (MAXALT) 5 MG tablet 10 tablet 0    Sig: Take 1 tablet (5 mg total) by mouth as needed for migraine. May repeat in 2 hours if needed     Neurology:  Migraine Therapy - Triptan Passed - 08/29/2019  2:19 PM      Passed - Last BP in normal range    BP Readings from Last 1 Encounters:  03/18/19 118/78         Passed - Valid encounter within last 12 months    Recent Outpatient Visits          5 months ago Wellness examination   [place], MD   11 months ago Hematoma   [place], MD   11 months ago [facility] -Chuck Hint, MD   1 year ago Pain of left breast   [place], Claudina Lick, MD   1 year ago Preventative health care   [facility] -Nican[place] Bake, Claudina Lick, MD

## 2019-09-01 MED ORDER — RIZATRIPTAN BENZOATE 5 MG PO TABS: 5.0000 mg | ORAL_TABLET | ORAL | 0 refills | Status: DC | PRN

## 2019-09-02 ENCOUNTER — Other Ambulatory Visit: Payer: Self-pay

## 2019-09-02 MED ORDER — RIZATRIPTAN BENZOATE 5 MG PO TABS: 5.0000 mg | ORAL_TABLET | ORAL | 0 refills | Status: DC | PRN

## 2019-09-09 DIAGNOSIS — M546 Pain in thoracic spine: Secondary | ICD-10-CM | POA: Diagnosis not present

## 2019-09-09 DIAGNOSIS — S22080S Wedge compression fracture of T11-T12 vertebra, sequela: Secondary | ICD-10-CM | POA: Diagnosis not present

## 2019-09-09 DIAGNOSIS — M419 Scoliosis, unspecified: Secondary | ICD-10-CM | POA: Diagnosis not present

## 2019-09-09 DIAGNOSIS — M545 Low back pain: Secondary | ICD-10-CM | POA: Diagnosis not present

## 2019-09-18 DIAGNOSIS — M545 Low back pain: Secondary | ICD-10-CM | POA: Diagnosis not present

## 2019-09-18 DIAGNOSIS — M40294 Other kyphosis, thoracic region: Secondary | ICD-10-CM | POA: Diagnosis not present

## 2019-09-18 DIAGNOSIS — R2689 Other abnormalities of gait and mobility: Secondary | ICD-10-CM | POA: Diagnosis not present

## 2019-09-18 DIAGNOSIS — M6281 Muscle weakness (generalized): Secondary | ICD-10-CM | POA: Diagnosis not present

## 2019-09-18 DIAGNOSIS — M546 Pain in thoracic spine: Secondary | ICD-10-CM | POA: Diagnosis not present

## 2019-09-23 DIAGNOSIS — R2689 Other abnormalities of gait and mobility: Secondary | ICD-10-CM | POA: Diagnosis not present

## 2019-09-23 DIAGNOSIS — M6281 Muscle weakness (generalized): Secondary | ICD-10-CM | POA: Diagnosis not present

## 2019-09-23 DIAGNOSIS — M40294 Other kyphosis, thoracic region: Secondary | ICD-10-CM | POA: Diagnosis not present

## 2019-09-23 DIAGNOSIS — M2560 Stiffness of unspecified joint, not elsewhere classified: Secondary | ICD-10-CM | POA: Diagnosis not present

## 2019-09-23 DIAGNOSIS — M546 Pain in thoracic spine: Secondary | ICD-10-CM | POA: Diagnosis not present

## 2019-09-23 DIAGNOSIS — M545 Low back pain: Secondary | ICD-10-CM | POA: Diagnosis not present

## 2019-09-29 DIAGNOSIS — M6281 Muscle weakness (generalized): Secondary | ICD-10-CM | POA: Diagnosis not present

## 2019-09-29 DIAGNOSIS — M2560 Stiffness of unspecified joint, not elsewhere classified: Secondary | ICD-10-CM | POA: Diagnosis not present

## 2019-09-29 DIAGNOSIS — M40294 Other kyphosis, thoracic region: Secondary | ICD-10-CM | POA: Diagnosis not present

## 2019-09-29 DIAGNOSIS — R2689 Other abnormalities of gait and mobility: Secondary | ICD-10-CM | POA: Diagnosis not present

## 2019-09-29 DIAGNOSIS — M545 Low back pain: Secondary | ICD-10-CM | POA: Diagnosis not present

## 2019-09-29 DIAGNOSIS — M546 Pain in thoracic spine: Secondary | ICD-10-CM | POA: Diagnosis not present

## 2019-10-01 DIAGNOSIS — R2689 Other abnormalities of gait and mobility: Secondary | ICD-10-CM | POA: Diagnosis not present

## 2019-10-01 DIAGNOSIS — M2560 Stiffness of unspecified joint, not elsewhere classified: Secondary | ICD-10-CM | POA: Diagnosis not present

## 2019-10-01 DIAGNOSIS — M545 Low back pain: Secondary | ICD-10-CM | POA: Diagnosis not present

## 2019-10-01 DIAGNOSIS — M6281 Muscle weakness (generalized): Secondary | ICD-10-CM | POA: Diagnosis not present

## 2019-10-01 DIAGNOSIS — M546 Pain in thoracic spine: Secondary | ICD-10-CM | POA: Diagnosis not present

## 2019-10-01 DIAGNOSIS — M40294 Other kyphosis, thoracic region: Secondary | ICD-10-CM | POA: Diagnosis not present

## 2019-10-06 DIAGNOSIS — M40294 Other kyphosis, thoracic region: Secondary | ICD-10-CM | POA: Diagnosis not present

## 2019-10-06 DIAGNOSIS — M545 Low back pain: Secondary | ICD-10-CM | POA: Diagnosis not present

## 2019-10-06 DIAGNOSIS — M6281 Muscle weakness (generalized): Secondary | ICD-10-CM | POA: Diagnosis not present

## 2019-10-06 DIAGNOSIS — M546 Pain in thoracic spine: Secondary | ICD-10-CM | POA: Diagnosis not present

## 2019-10-06 DIAGNOSIS — R2689 Other abnormalities of gait and mobility: Secondary | ICD-10-CM | POA: Diagnosis not present

## 2019-10-06 DIAGNOSIS — M2560 Stiffness of unspecified joint, not elsewhere classified: Secondary | ICD-10-CM | POA: Diagnosis not present

## 2019-10-10 DIAGNOSIS — H43813 Vitreous degeneration, bilateral: Secondary | ICD-10-CM | POA: Diagnosis not present

## 2019-10-10 DIAGNOSIS — H353211 Exudative age-related macular degeneration, right eye, with active choroidal neovascularization: Secondary | ICD-10-CM | POA: Diagnosis not present

## 2019-10-10 DIAGNOSIS — H43392 Other vitreous opacities, left eye: Secondary | ICD-10-CM | POA: Diagnosis not present

## 2019-10-10 DIAGNOSIS — H353122 Nonexudative age-related macular degeneration, left eye, intermediate dry stage: Secondary | ICD-10-CM | POA: Diagnosis not present

## 2019-10-12 ENCOUNTER — Other Ambulatory Visit: Payer: Self-pay | Admitting: Internal Medicine

## 2019-10-15 DIAGNOSIS — M545 Low back pain: Secondary | ICD-10-CM | POA: Diagnosis not present

## 2019-10-15 DIAGNOSIS — M6281 Muscle weakness (generalized): Secondary | ICD-10-CM | POA: Diagnosis not present

## 2019-10-15 DIAGNOSIS — M546 Pain in thoracic spine: Secondary | ICD-10-CM | POA: Diagnosis not present

## 2019-10-15 DIAGNOSIS — M2560 Stiffness of unspecified joint, not elsewhere classified: Secondary | ICD-10-CM | POA: Diagnosis not present

## 2019-10-15 DIAGNOSIS — M40294 Other kyphosis, thoracic region: Secondary | ICD-10-CM | POA: Diagnosis not present

## 2019-10-15 DIAGNOSIS — R2689 Other abnormalities of gait and mobility: Secondary | ICD-10-CM | POA: Diagnosis not present

## 2019-10-20 DIAGNOSIS — M2560 Stiffness of unspecified joint, not elsewhere classified: Secondary | ICD-10-CM | POA: Diagnosis not present

## 2019-10-20 DIAGNOSIS — M545 Low back pain: Secondary | ICD-10-CM | POA: Diagnosis not present

## 2019-10-20 DIAGNOSIS — R2689 Other abnormalities of gait and mobility: Secondary | ICD-10-CM | POA: Diagnosis not present

## 2019-10-20 DIAGNOSIS — M6281 Muscle weakness (generalized): Secondary | ICD-10-CM | POA: Diagnosis not present

## 2019-10-20 DIAGNOSIS — M40294 Other kyphosis, thoracic region: Secondary | ICD-10-CM | POA: Diagnosis not present

## 2019-10-20 DIAGNOSIS — M546 Pain in thoracic spine: Secondary | ICD-10-CM | POA: Diagnosis not present

## 2019-10-23 DIAGNOSIS — M2560 Stiffness of unspecified joint, not elsewhere classified: Secondary | ICD-10-CM | POA: Diagnosis not present

## 2019-10-23 DIAGNOSIS — M546 Pain in thoracic spine: Secondary | ICD-10-CM | POA: Diagnosis not present

## 2019-10-23 DIAGNOSIS — M40294 Other kyphosis, thoracic region: Secondary | ICD-10-CM | POA: Diagnosis not present

## 2019-10-23 DIAGNOSIS — R2689 Other abnormalities of gait and mobility: Secondary | ICD-10-CM | POA: Diagnosis not present

## 2019-10-23 DIAGNOSIS — M6281 Muscle weakness (generalized): Secondary | ICD-10-CM | POA: Diagnosis not present

## 2019-10-23 DIAGNOSIS — M545 Low back pain: Secondary | ICD-10-CM | POA: Diagnosis not present

## 2019-10-28 DIAGNOSIS — M2560 Stiffness of unspecified joint, not elsewhere classified: Secondary | ICD-10-CM | POA: Diagnosis not present

## 2019-10-28 DIAGNOSIS — M6281 Muscle weakness (generalized): Secondary | ICD-10-CM | POA: Diagnosis not present

## 2019-10-28 DIAGNOSIS — M40294 Other kyphosis, thoracic region: Secondary | ICD-10-CM | POA: Diagnosis not present

## 2019-10-28 DIAGNOSIS — M546 Pain in thoracic spine: Secondary | ICD-10-CM | POA: Diagnosis not present

## 2019-10-28 DIAGNOSIS — R2689 Other abnormalities of gait and mobility: Secondary | ICD-10-CM | POA: Diagnosis not present

## 2019-10-28 DIAGNOSIS — M545 Low back pain: Secondary | ICD-10-CM | POA: Diagnosis not present

## 2019-10-30 DIAGNOSIS — K589 Irritable bowel syndrome without diarrhea: Secondary | ICD-10-CM | POA: Diagnosis not present

## 2019-10-30 DIAGNOSIS — E6609 Other obesity due to excess calories: Secondary | ICD-10-CM | POA: Diagnosis not present

## 2019-10-30 DIAGNOSIS — L659 Nonscarring hair loss, unspecified: Secondary | ICD-10-CM | POA: Diagnosis not present

## 2019-10-30 DIAGNOSIS — N951 Menopausal and female climacteric states: Secondary | ICD-10-CM | POA: Diagnosis not present

## 2019-10-30 DIAGNOSIS — E890 Postprocedural hypothyroidism: Secondary | ICD-10-CM | POA: Diagnosis not present

## 2019-10-30 DIAGNOSIS — B9681 Helicobacter pylori [H. pylori] as the cause of diseases classified elsewhere: Secondary | ICD-10-CM | POA: Diagnosis not present

## 2019-10-30 DIAGNOSIS — M2550 Pain in unspecified joint: Secondary | ICD-10-CM | POA: Diagnosis not present

## 2019-10-30 DIAGNOSIS — M797 Fibromyalgia: Secondary | ICD-10-CM | POA: Diagnosis not present

## 2019-10-31 DIAGNOSIS — M2560 Stiffness of unspecified joint, not elsewhere classified: Secondary | ICD-10-CM | POA: Diagnosis not present

## 2019-10-31 DIAGNOSIS — M40294 Other kyphosis, thoracic region: Secondary | ICD-10-CM | POA: Diagnosis not present

## 2019-10-31 DIAGNOSIS — M546 Pain in thoracic spine: Secondary | ICD-10-CM | POA: Diagnosis not present

## 2019-10-31 DIAGNOSIS — M545 Low back pain: Secondary | ICD-10-CM | POA: Diagnosis not present

## 2019-10-31 DIAGNOSIS — R2689 Other abnormalities of gait and mobility: Secondary | ICD-10-CM | POA: Diagnosis not present

## 2019-10-31 DIAGNOSIS — M6281 Muscle weakness (generalized): Secondary | ICD-10-CM | POA: Diagnosis not present

## 2019-11-03 DIAGNOSIS — M6281 Muscle weakness (generalized): Secondary | ICD-10-CM | POA: Diagnosis not present

## 2019-11-03 DIAGNOSIS — M545 Low back pain: Secondary | ICD-10-CM | POA: Diagnosis not present

## 2019-11-03 DIAGNOSIS — R2689 Other abnormalities of gait and mobility: Secondary | ICD-10-CM | POA: Diagnosis not present

## 2019-11-03 DIAGNOSIS — M40294 Other kyphosis, thoracic region: Secondary | ICD-10-CM | POA: Diagnosis not present

## 2019-11-03 DIAGNOSIS — M546 Pain in thoracic spine: Secondary | ICD-10-CM | POA: Diagnosis not present

## 2019-11-03 DIAGNOSIS — M2560 Stiffness of unspecified joint, not elsewhere classified: Secondary | ICD-10-CM | POA: Diagnosis not present

## 2019-11-13 DIAGNOSIS — M545 Low back pain: Secondary | ICD-10-CM | POA: Diagnosis not present

## 2019-11-13 DIAGNOSIS — M6281 Muscle weakness (generalized): Secondary | ICD-10-CM | POA: Diagnosis not present

## 2019-11-13 DIAGNOSIS — R2689 Other abnormalities of gait and mobility: Secondary | ICD-10-CM | POA: Diagnosis not present

## 2019-11-13 DIAGNOSIS — M2560 Stiffness of unspecified joint, not elsewhere classified: Secondary | ICD-10-CM | POA: Diagnosis not present

## 2019-11-13 DIAGNOSIS — M546 Pain in thoracic spine: Secondary | ICD-10-CM | POA: Diagnosis not present

## 2019-11-13 DIAGNOSIS — M40294 Other kyphosis, thoracic region: Secondary | ICD-10-CM | POA: Diagnosis not present

## 2019-11-24 DIAGNOSIS — M545 Low back pain: Secondary | ICD-10-CM | POA: Diagnosis not present

## 2019-11-24 DIAGNOSIS — M6281 Muscle weakness (generalized): Secondary | ICD-10-CM | POA: Diagnosis not present

## 2019-11-24 DIAGNOSIS — R2689 Other abnormalities of gait and mobility: Secondary | ICD-10-CM | POA: Diagnosis not present

## 2019-11-24 DIAGNOSIS — M2560 Stiffness of unspecified joint, not elsewhere classified: Secondary | ICD-10-CM | POA: Diagnosis not present

## 2019-11-24 DIAGNOSIS — M546 Pain in thoracic spine: Secondary | ICD-10-CM | POA: Diagnosis not present

## 2019-11-24 DIAGNOSIS — M40294 Other kyphosis, thoracic region: Secondary | ICD-10-CM | POA: Diagnosis not present

## 2019-11-25 DIAGNOSIS — M545 Low back pain: Secondary | ICD-10-CM | POA: Diagnosis not present

## 2019-11-25 DIAGNOSIS — M546 Pain in thoracic spine: Secondary | ICD-10-CM | POA: Diagnosis not present

## 2019-11-25 DIAGNOSIS — Z6834 Body mass index (BMI) 34.0-34.9, adult: Secondary | ICD-10-CM | POA: Diagnosis not present

## 2019-11-25 DIAGNOSIS — M5106 Intervertebral disc disorders with myelopathy, lumbar region: Secondary | ICD-10-CM | POA: Diagnosis not present

## 2019-11-25 DIAGNOSIS — S22080S Wedge compression fracture of T11-T12 vertebra, sequela: Secondary | ICD-10-CM | POA: Diagnosis not present

## 2019-11-26 DIAGNOSIS — H43392 Other vitreous opacities, left eye: Secondary | ICD-10-CM | POA: Diagnosis not present

## 2019-11-26 DIAGNOSIS — H353122 Nonexudative age-related macular degeneration, left eye, intermediate dry stage: Secondary | ICD-10-CM | POA: Diagnosis not present

## 2019-11-26 DIAGNOSIS — H353211 Exudative age-related macular degeneration, right eye, with active choroidal neovascularization: Secondary | ICD-10-CM | POA: Diagnosis not present

## 2019-11-26 DIAGNOSIS — H43813 Vitreous degeneration, bilateral: Secondary | ICD-10-CM | POA: Diagnosis not present

## 2019-12-05 DIAGNOSIS — R2689 Other abnormalities of gait and mobility: Secondary | ICD-10-CM | POA: Diagnosis not present

## 2019-12-05 DIAGNOSIS — M546 Pain in thoracic spine: Secondary | ICD-10-CM | POA: Diagnosis not present

## 2019-12-05 DIAGNOSIS — M545 Low back pain: Secondary | ICD-10-CM | POA: Diagnosis not present

## 2019-12-05 DIAGNOSIS — M6281 Muscle weakness (generalized): Secondary | ICD-10-CM | POA: Diagnosis not present

## 2019-12-05 DIAGNOSIS — M2560 Stiffness of unspecified joint, not elsewhere classified: Secondary | ICD-10-CM | POA: Diagnosis not present

## 2019-12-05 DIAGNOSIS — M40294 Other kyphosis, thoracic region: Secondary | ICD-10-CM | POA: Diagnosis not present

## 2019-12-11 DIAGNOSIS — M545 Low back pain: Secondary | ICD-10-CM | POA: Diagnosis not present

## 2019-12-11 DIAGNOSIS — M546 Pain in thoracic spine: Secondary | ICD-10-CM | POA: Diagnosis not present

## 2019-12-12 ENCOUNTER — Other Ambulatory Visit: Payer: Self-pay | Admitting: Internal Medicine

## 2019-12-15 DIAGNOSIS — M6281 Muscle weakness (generalized): Secondary | ICD-10-CM | POA: Diagnosis not present

## 2019-12-15 DIAGNOSIS — M40294 Other kyphosis, thoracic region: Secondary | ICD-10-CM | POA: Diagnosis not present

## 2019-12-15 DIAGNOSIS — M545 Low back pain: Secondary | ICD-10-CM | POA: Diagnosis not present

## 2019-12-15 DIAGNOSIS — M546 Pain in thoracic spine: Secondary | ICD-10-CM | POA: Diagnosis not present

## 2019-12-15 DIAGNOSIS — R2689 Other abnormalities of gait and mobility: Secondary | ICD-10-CM | POA: Diagnosis not present

## 2019-12-15 DIAGNOSIS — M2560 Stiffness of unspecified joint, not elsewhere classified: Secondary | ICD-10-CM | POA: Diagnosis not present

## 2019-12-16 NOTE — Telephone Encounter (Signed)
Last OV 03/18/19 Next OV NA Last RF 08/19/19

## 2019-12-24 ENCOUNTER — Other Ambulatory Visit: Payer: Self-pay

## 2019-12-24 ENCOUNTER — Ambulatory Visit (INDEPENDENT_AMBULATORY_CARE_PROVIDER_SITE_OTHER): Payer: Medicare Other | Admitting: Internal Medicine

## 2019-12-24 ENCOUNTER — Encounter: Payer: Self-pay | Admitting: Internal Medicine

## 2019-12-24 VITALS — BP 126/76 | HR 68 | Temp 97.8°F | Resp 16 | Ht 67.0 in | Wt 220.0 lb

## 2019-12-24 DIAGNOSIS — N6322 Unspecified lump in the left breast, upper inner quadrant: Secondary | ICD-10-CM | POA: Insufficient documentation

## 2019-12-24 NOTE — Patient Instructions (Signed)
A diagnostic mammogram was ordered.  An [place] of your breast was ordered if needed.

## 2019-12-24 NOTE — Progress Notes (Signed)
Subjective:    Patient ID: Patricia Greene, female    DOB: 1956/05/24, 64 y.o.   MRN: [id number]  HPI The patient is here for an acute visit.  Breast lump on left: Recently she has noticed a little bit of discomfort in both of her breasts from the top of the breast down toward the nipple.  She thought this may be from taking care of her mother who is currently bed confined.  This morning she had a little discomfort in the left breast and felt an oblong lump in the medial, upper breast.  She does not recall feeling it in the past.  She denies any personal history of cysts.  Her mother does have a history of breast cancer.  She has a history of implants, but does removed 2 years ago.  She denies any changes in the skin of the breast or any nipple discharge.       Medications and allergies reviewed with patient and updated if appropriate.  Patient Active Problem List   Diagnosis Date Noted  . Obesity 03/17/2019  . 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 Unilateral primary osteoarthritis, left knee 02/21/2018  . Chronic pain of left knee 01/23/2018  . Helicobacter pylori gastritis 10/02/2017  . Hx of adenomatous polyp of colon 09/27/2017  . Lymphadenopathy of right cervical region 04/06/2017  . Closed wedge compression fracture of T11 vertebra ([place]) 03/15/2017  . Reactive airway disease with wheezing 03/14/2017  . Back muscle spasm 10/04/2016  . H/O breast implant 04/15/2016  . Osteopenia 02/01/2015  . Allergic rhinitis, cause unspecified 03/11/2011  . Vitamin D deficiency 03/13/2010  . Migraine headache 03/13/2010  . CAROTID BRUIT 03/13/2010  . Hypothyroidism 03/09/2010  . INSOMNIA, CHRONIC 02/20/2008  . Anxiety 09/06/2007  . FIBROMYALGIA 09/06/2007  . IRRITABLE BOWEL SYNDROME, HX OF 09/06/2007    Current Outpatient Medications on File Prior to Visit  Medication Sig Dispense Refill  . baclofen (LIORESAL) 20 MG tablet TAKE ONE TABLET BY MOUTH FOUR TIMES A DAY 90 tablet 0    . LORazepam (ATIVAN) 1 MG tablet TAKE ONE TABLET BY MOUTH TWICE A DAY FOR ANXIETY 60 tablet 0  . rizatriptan (MAXALT) 5 MG tablet Take 1 tablet (5 mg total) by mouth as needed for migraine. May repeat in 2 hours if needed 10 tablet 0  . thyroid (ARMOUR) 130 MG tablet Take 130 mg by mouth daily.     No current facility-administered medications on file prior to visit.    Past Medical History:  Diagnosis Date  . Allergic rhinitis   . Allergy   . Anxiety state, unspecified   . Arthritis    left thumb  . Carotid bruit   . Cervicalgia   . Diverticulosis   . Fibromyalgia    better since breast implants were removed  . GERD (gastroesophageal reflux disease)   . Helicobacter pylori gastritis 10/02/2017   EGD and bx 12/.2018 Quad tx   . History of migraine headaches   . Hypothyroidism    nodular goiter  . IBS (irritable bowel syndrome)   . Insomnia   . Obesity   . Unspecified vitamin D deficiency     Past Surgical History:  Procedure Laterality Date  . BREAST ENHANCEMENT SURGERY    . breast implants removed    . CHOLECYSTECTOMY N/A 06/19/2017   Procedure: LAPAROSCOPIC CHOLECYSTECTOMY WITH INTRAOPERATIVE CHOLANGIOGRAM,REPAIR OF DUODENAL INJURY;  Surgeon: Kieth Brightly, Arta Bruce, MD;  Location: [facility];  Service: General;  Laterality: N/A;  .  COLONOSCOPY  1999, 2004 Sammuel Cooper), 08/09/2011   1999, 2004: diverticulosis  . finger reattachment    . THYROIDECTOMY  1973   right, toxic nodule  . TONSILLECTOMY      Social History   Socioeconomic History  . Marital status: Divorced    Spouse name: Not on file  . Number of children: 0  . Years of education: Not on file  . Highest education level: Not on file  Occupational History  . Occupation: Therapist, sports  Tobacco Use  . Smoking status: Never Smoker  . Smokeless tobacco: Never Used  Substance and Sexual Activity  . Alcohol use: No    Comment: rare   . Drug use: No  . Sexual activity: Not on file  Other Topics Concern  . Not on file   Social History Narrative   2-3 caffeine drinks daily    Divorced, no children. 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 Difficulty of Paying Living Expenses:   Food Insecurity:   . Worried About Charity fundraiser in the Last Year:   . Arboriculturist in the Last Year:   Transportation Needs:   . Film/video editor (Medical):   Marland Kitchen Lack of Transportation (Non-Medical):   Physical Activity:   . Days of Exercise per Week:   . Minutes of Exercise per Session:   Stress:   . Feeling of Stress :   Social Connections:   . Frequency of Communication with Friends and Family:   . Frequency of Social Gatherings with Friends and Family:   . Attends Religious Services:   . Active Member of Clubs or Organizations:   . Attends Archivist Meetings:   Marland Kitchen Marital Status:     Family History  Problem Relation Age of Onset  . Asthma Mother   . Breast cancer Mother   . Allergic rhinitis Mother   . Heart disease Mother   . Colon cancer Father 20       died at 15  . Colon polyps Sister   . Esophageal cancer Neg Hx   . Stomach cancer Neg Hx   . Rectal cancer Neg Hx     Review of Systems Per HPI    Objective:   Vitals:   12/24/19 1525  BP: 126/76  Pulse: 68  Resp: 16  Temp: 97.8 F (36.6 C)  SpO2: 98%   BP Readings from Last 3 Encounters:  12/24/19 126/76  03/18/19 118/78  09/20/18 118/72   Wt Readings from Last 3 Encounters:  12/24/19 220 lb (99.8 kg)  03/18/19 209 lb 12.8 oz (95.2 kg)  09/20/18 215 lb (97.5 kg)   Body mass index is 34.46 kg/m.   Physical Exam Constitutional:      General: She is not in acute distress.    Appearance: Normal appearance. She is not ill-appearing.  Chest:     Breasts:        Right: Normal. No swelling, mass, nipple discharge, skin change or tenderness.        Left: Mass (Mildly tender, oblong lump 10:00) and  tenderness present. No swelling, nipple discharge or skin change.    Lymphadenopathy:     Upper Body:     Right upper body: No axillary or pectoral adenopathy.     Left upper body: No axillary or pectoral adenopathy.  Skin:    General: Skin is warm  and dry.  Neurological:     Mental Status: She is alert.            Assessment & Plan:    See Problem List for Assessment and Plan of chronic medical problems.    This visit occurred during the SARS-CoV-2 public health emergency.  Safety protocols were in place, including screening questions prior to the visit, additional usage of staff PPE, and extensive cleaning of exam room while observing appropriate contact time as indicated for disinfecting solutions.

## 2019-12-24 NOTE — Assessment & Plan Note (Signed)
Acute Noticed left tender breast lump 10:00 medially this morning Last mammogram on file 03/2018 Mother had breast cancer No personal history of cysts, personal history of implants that were removed 2 years ago Palpable lump 10:00 medially, no other abnormal findings on exam Stat diagnostic mammogram and ultrasound of left breast
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
## 2019-12-30 ENCOUNTER — Other Ambulatory Visit: Payer: Self-pay

## 2019-12-30 ENCOUNTER — Encounter: Payer: Self-pay | Admitting: Internal Medicine

## 2019-12-30 ENCOUNTER — Ambulatory Visit (INDEPENDENT_AMBULATORY_CARE_PROVIDER_SITE_OTHER): Payer: Medicare Other | Admitting: Internal Medicine

## 2019-12-30 VITALS — BP 132/80 | HR 71 | Temp 98.3°F | Resp 16 | Ht 67.0 in

## 2019-12-30 DIAGNOSIS — M62830 Muscle spasm of back: Secondary | ICD-10-CM

## 2019-12-30 DIAGNOSIS — T300 Burn of unspecified body region, unspecified degree: Secondary | ICD-10-CM | POA: Insufficient documentation

## 2019-12-30 MED ORDER — SILVER SULFADIAZINE 1 % EX CREA: 1.0000 "application " | TOPICAL_CREAM | Freq: Every day | CUTANEOUS | 0 refills | Status: DC

## 2019-12-30 MED ORDER — METHOCARBAMOL 500 MG PO TABS: 500.0000 mg | ORAL_TABLET | Freq: Four times a day (QID) | ORAL | 1 refills | Status: DC | PRN

## 2019-12-30 NOTE — Patient Instructions (Signed)
Use the silver sulfadiazine cream 1-2 times a day    Try the methocarbamol for your back.   A referral was ordered for the wound clinic.
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
## 2019-12-30 NOTE — Assessment & Plan Note (Signed)
Acute on chronic Having increased back pain secondary to muscle spasms from trying to lift up her mother Taking baclofen as needed, but this makes her drowsy and therefore is not able to take it as often as she should Trial of methocarbamol to see if that helps without making her too drowsy

## 2019-12-31 DIAGNOSIS — M546 Pain in thoracic spine: Secondary | ICD-10-CM | POA: Diagnosis not present

## 2019-12-31 DIAGNOSIS — R2689 Other abnormalities of gait and mobility: Secondary | ICD-10-CM | POA: Diagnosis not present

## 2019-12-31 DIAGNOSIS — M6281 Muscle weakness (generalized): Secondary | ICD-10-CM | POA: Diagnosis not present

## 2019-12-31 DIAGNOSIS — M2560 Stiffness of unspecified joint, not elsewhere classified: Secondary | ICD-10-CM | POA: Diagnosis not present

## 2019-12-31 DIAGNOSIS — M545 Low back pain: Secondary | ICD-10-CM | POA: Diagnosis not present

## 2019-12-31 DIAGNOSIS — M40294 Other kyphosis, thoracic region: Secondary | ICD-10-CM | POA: Diagnosis not present

## 2020-01-01 DIAGNOSIS — T23231A Burn of second degree of multiple right fingers (nail), not including thumb, initial encounter: Secondary | ICD-10-CM | POA: Diagnosis not present

## 2020-01-01 DIAGNOSIS — T23231D Burn of second degree of multiple right fingers (nail), not including thumb, subsequent encounter: Secondary | ICD-10-CM | POA: Diagnosis not present

## 2020-01-05 DIAGNOSIS — Z20822 Contact with and (suspected) exposure to covid-19: Secondary | ICD-10-CM | POA: Diagnosis not present

## 2020-01-06 DIAGNOSIS — M40294 Other kyphosis, thoracic region: Secondary | ICD-10-CM | POA: Diagnosis not present

## 2020-01-06 DIAGNOSIS — M6281 Muscle weakness (generalized): Secondary | ICD-10-CM | POA: Diagnosis not present

## 2020-01-06 DIAGNOSIS — M546 Pain in thoracic spine: Secondary | ICD-10-CM | POA: Diagnosis not present

## 2020-01-06 DIAGNOSIS — M2560 Stiffness of unspecified joint, not elsewhere classified: Secondary | ICD-10-CM | POA: Diagnosis not present

## 2020-01-06 DIAGNOSIS — M545 Low back pain: Secondary | ICD-10-CM | POA: Diagnosis not present

## 2020-01-06 DIAGNOSIS — R2689 Other abnormalities of gait and mobility: Secondary | ICD-10-CM | POA: Diagnosis not present

## 2020-01-08 ENCOUNTER — Encounter: Payer: Self-pay | Admitting: Internal Medicine

## 2020-01-12 DIAGNOSIS — M546 Pain in thoracic spine: Secondary | ICD-10-CM | POA: Diagnosis not present

## 2020-01-12 DIAGNOSIS — M545 Low back pain: Secondary | ICD-10-CM | POA: Diagnosis not present

## 2020-01-12 DIAGNOSIS — R2689 Other abnormalities of gait and mobility: Secondary | ICD-10-CM | POA: Diagnosis not present

## 2020-01-12 DIAGNOSIS — M2560 Stiffness of unspecified joint, not elsewhere classified: Secondary | ICD-10-CM | POA: Diagnosis not present

## 2020-01-12 DIAGNOSIS — M40294 Other kyphosis, thoracic region: Secondary | ICD-10-CM | POA: Diagnosis not present

## 2020-01-12 DIAGNOSIS — M6281 Muscle weakness (generalized): Secondary | ICD-10-CM | POA: Diagnosis not present

## 2020-01-14 DIAGNOSIS — M545 Low back pain: Secondary | ICD-10-CM | POA: Diagnosis not present

## 2020-01-14 DIAGNOSIS — M5106 Intervertebral disc disorders with myelopathy, lumbar region: Secondary | ICD-10-CM | POA: Diagnosis not present

## 2020-01-14 DIAGNOSIS — Z6834 Body mass index (BMI) 34.0-34.9, adult: Secondary | ICD-10-CM | POA: Diagnosis not present

## 2020-01-14 DIAGNOSIS — M546 Pain in thoracic spine: Secondary | ICD-10-CM | POA: Diagnosis not present

## 2020-01-15 DIAGNOSIS — N6322 Unspecified lump in the left breast, upper inner quadrant: Secondary | ICD-10-CM | POA: Diagnosis not present

## 2020-01-15 DIAGNOSIS — R928 Other abnormal and inconclusive findings on diagnostic imaging of breast: Secondary | ICD-10-CM | POA: Diagnosis not present

## 2020-01-15 LAB — HM MAMMOGRAPHY

## 2020-01-21 DIAGNOSIS — M6281 Muscle weakness (generalized): Secondary | ICD-10-CM | POA: Diagnosis not present

## 2020-01-21 DIAGNOSIS — M545 Low back pain: Secondary | ICD-10-CM | POA: Diagnosis not present

## 2020-01-21 DIAGNOSIS — M40294 Other kyphosis, thoracic region: Secondary | ICD-10-CM | POA: Diagnosis not present

## 2020-01-21 DIAGNOSIS — H353122 Nonexudative age-related macular degeneration, left eye, intermediate dry stage: Secondary | ICD-10-CM | POA: Diagnosis not present

## 2020-01-21 DIAGNOSIS — H43392 Other vitreous opacities, left eye: Secondary | ICD-10-CM | POA: Diagnosis not present

## 2020-01-21 DIAGNOSIS — H353211 Exudative age-related macular degeneration, right eye, with active choroidal neovascularization: Secondary | ICD-10-CM | POA: Diagnosis not present

## 2020-01-21 DIAGNOSIS — R2689 Other abnormalities of gait and mobility: Secondary | ICD-10-CM | POA: Diagnosis not present

## 2020-01-21 DIAGNOSIS — M546 Pain in thoracic spine: Secondary | ICD-10-CM | POA: Diagnosis not present

## 2020-01-21 DIAGNOSIS — M2560 Stiffness of unspecified joint, not elsewhere classified: Secondary | ICD-10-CM | POA: Diagnosis not present

## 2020-01-21 DIAGNOSIS — H43813 Vitreous degeneration, bilateral: Secondary | ICD-10-CM | POA: Diagnosis not present

## 2020-01-29 ENCOUNTER — Encounter: Payer: Self-pay | Admitting: Internal Medicine

## 2020-02-04 ENCOUNTER — Other Ambulatory Visit: Payer: Self-pay | Admitting: Internal Medicine

## 2020-02-05 NOTE — Telephone Encounter (Signed)
Last OV 03/17/20 Next OV NA Last RF 12/16/19

## 2020-03-30 DIAGNOSIS — H353122 Nonexudative age-related macular degeneration, left eye, intermediate dry stage: Secondary | ICD-10-CM | POA: Diagnosis not present

## 2020-03-30 DIAGNOSIS — H43813 Vitreous degeneration, bilateral: Secondary | ICD-10-CM | POA: Diagnosis not present

## 2020-03-30 DIAGNOSIS — H43392 Other vitreous opacities, left eye: Secondary | ICD-10-CM | POA: Diagnosis not present

## 2020-03-30 DIAGNOSIS — H353211 Exudative age-related macular degeneration, right eye, with active choroidal neovascularization: Secondary | ICD-10-CM | POA: Diagnosis not present

## 2020-04-27 DIAGNOSIS — K589 Irritable bowel syndrome without diarrhea: Secondary | ICD-10-CM | POA: Diagnosis not present

## 2020-04-27 DIAGNOSIS — B9681 Helicobacter pylori [H. pylori] as the cause of diseases classified elsewhere: Secondary | ICD-10-CM | POA: Diagnosis not present

## 2020-04-27 DIAGNOSIS — M797 Fibromyalgia: Secondary | ICD-10-CM | POA: Diagnosis not present

## 2020-04-27 DIAGNOSIS — N951 Menopausal and female climacteric states: Secondary | ICD-10-CM | POA: Diagnosis not present

## 2020-04-27 DIAGNOSIS — E6609 Other obesity due to excess calories: Secondary | ICD-10-CM | POA: Diagnosis not present

## 2020-04-27 DIAGNOSIS — L659 Nonscarring hair loss, unspecified: Secondary | ICD-10-CM | POA: Diagnosis not present

## 2020-04-27 DIAGNOSIS — M2550 Pain in unspecified joint: Secondary | ICD-10-CM | POA: Diagnosis not present

## 2020-04-27 DIAGNOSIS — E890 Postprocedural hypothyroidism: Secondary | ICD-10-CM | POA: Diagnosis not present

## 2020-04-28 ENCOUNTER — Ambulatory Visit: Payer: Medicare Other | Admitting: Internal Medicine

## 2020-05-04 DIAGNOSIS — H43813 Vitreous degeneration, bilateral: Secondary | ICD-10-CM | POA: Diagnosis not present

## 2020-05-04 DIAGNOSIS — H353122 Nonexudative age-related macular degeneration, left eye, intermediate dry stage: Secondary | ICD-10-CM | POA: Diagnosis not present

## 2020-05-04 DIAGNOSIS — H43392 Other vitreous opacities, left eye: Secondary | ICD-10-CM | POA: Diagnosis not present

## 2020-05-04 DIAGNOSIS — H353211 Exudative age-related macular degeneration, right eye, with active choroidal neovascularization: Secondary | ICD-10-CM | POA: Diagnosis not present

## 2020-05-19 ENCOUNTER — Telehealth: Payer: Self-pay | Admitting: Internal Medicine

## 2020-05-19 NOTE — Telephone Encounter (Signed)
Left message for patient today. 

## 2020-05-19 NOTE — Telephone Encounter (Signed)
Sounds more like a local reaction.  It is not a serious reaction and I do think she should get the second vaccine to get more protection from the virus.

## 2020-05-19 NOTE — Telephone Encounter (Signed)
New Message:   Pt is calling and states she was exposed to Covid in March and states she got the vaccination a couple of weeks ago. Pt states at the site of the injection it was red, big, hard, and hot. She states it was the size of two quarters. Pt states she would like to discuss this with someone cause she is not sure if it a allergic reaction and if she should get the second vaccination. Please advise.

## 2020-05-25 ENCOUNTER — Encounter: Payer: Medicare Other | Admitting: Internal Medicine

## 2020-05-26 DIAGNOSIS — M79671 Pain in right foot: Secondary | ICD-10-CM | POA: Diagnosis not present

## 2020-05-26 DIAGNOSIS — M7661 Achilles tendinitis, right leg: Secondary | ICD-10-CM | POA: Diagnosis not present

## 2020-05-26 DIAGNOSIS — M722 Plantar fascial fibromatosis: Secondary | ICD-10-CM | POA: Diagnosis not present

## 2020-05-27 ENCOUNTER — Other Ambulatory Visit: Payer: Self-pay | Admitting: Internal Medicine

## 2020-05-31 DIAGNOSIS — M79671 Pain in right foot: Secondary | ICD-10-CM | POA: Diagnosis not present

## 2020-05-31 DIAGNOSIS — M6281 Muscle weakness (generalized): Secondary | ICD-10-CM | POA: Diagnosis not present

## 2020-06-02 ENCOUNTER — Encounter: Payer: Medicare Other | Admitting: Internal Medicine

## 2020-06-02 DIAGNOSIS — M79671 Pain in right foot: Secondary | ICD-10-CM | POA: Diagnosis not present

## 2020-06-02 DIAGNOSIS — M6281 Muscle weakness (generalized): Secondary | ICD-10-CM | POA: Diagnosis not present

## 2020-06-14 DIAGNOSIS — M79671 Pain in right foot: Secondary | ICD-10-CM | POA: Diagnosis not present

## 2020-06-14 DIAGNOSIS — M6281 Muscle weakness (generalized): Secondary | ICD-10-CM | POA: Diagnosis not present

## 2020-06-15 DIAGNOSIS — H43813 Vitreous degeneration, bilateral: Secondary | ICD-10-CM | POA: Diagnosis not present

## 2020-06-15 DIAGNOSIS — H353211 Exudative age-related macular degeneration, right eye, with active choroidal neovascularization: Secondary | ICD-10-CM | POA: Diagnosis not present

## 2020-06-15 DIAGNOSIS — H353122 Nonexudative age-related macular degeneration, left eye, intermediate dry stage: Secondary | ICD-10-CM | POA: Diagnosis not present

## 2020-06-17 DIAGNOSIS — M6281 Muscle weakness (generalized): Secondary | ICD-10-CM | POA: Diagnosis not present

## 2020-06-17 DIAGNOSIS — M79671 Pain in right foot: Secondary | ICD-10-CM | POA: Diagnosis not present

## 2020-06-21 DIAGNOSIS — M79671 Pain in right foot: Secondary | ICD-10-CM | POA: Diagnosis not present

## 2020-06-21 DIAGNOSIS — M6281 Muscle weakness (generalized): Secondary | ICD-10-CM | POA: Diagnosis not present

## 2020-06-23 DIAGNOSIS — M79671 Pain in right foot: Secondary | ICD-10-CM | POA: Diagnosis not present

## 2020-06-23 DIAGNOSIS — M6281 Muscle weakness (generalized): Secondary | ICD-10-CM | POA: Diagnosis not present

## 2020-06-28 DIAGNOSIS — M6281 Muscle weakness (generalized): Secondary | ICD-10-CM | POA: Diagnosis not present

## 2020-06-28 DIAGNOSIS — M79671 Pain in right foot: Secondary | ICD-10-CM | POA: Diagnosis not present

## 2020-06-29 DIAGNOSIS — M79642 Pain in left hand: Secondary | ICD-10-CM | POA: Diagnosis not present

## 2020-07-01 DIAGNOSIS — M79671 Pain in right foot: Secondary | ICD-10-CM | POA: Diagnosis not present

## 2020-07-01 DIAGNOSIS — M6281 Muscle weakness (generalized): Secondary | ICD-10-CM | POA: Diagnosis not present

## 2020-07-05 DIAGNOSIS — M79671 Pain in right foot: Secondary | ICD-10-CM | POA: Diagnosis not present

## 2020-07-05 DIAGNOSIS — M25571 Pain in right ankle and joints of right foot: Secondary | ICD-10-CM | POA: Diagnosis not present

## 2020-07-05 DIAGNOSIS — M722 Plantar fascial fibromatosis: Secondary | ICD-10-CM | POA: Diagnosis not present

## 2020-07-05 DIAGNOSIS — M7661 Achilles tendinitis, right leg: Secondary | ICD-10-CM | POA: Diagnosis not present

## 2020-07-15 ENCOUNTER — Ambulatory Visit (INDEPENDENT_AMBULATORY_CARE_PROVIDER_SITE_OTHER): Payer: Medicare Other | Admitting: Nurse Practitioner

## 2020-07-15 ENCOUNTER — Encounter: Payer: Self-pay | Admitting: Nurse Practitioner

## 2020-07-15 VITALS — BP 128/76 | HR 67 | Ht 67.0 in | Wt 214.0 lb

## 2020-07-15 DIAGNOSIS — R194 Change in bowel habit: Secondary | ICD-10-CM | POA: Diagnosis not present

## 2020-07-15 DIAGNOSIS — Z86010 Personal history of colonic polyps: Secondary | ICD-10-CM | POA: Diagnosis not present

## 2020-07-15 NOTE — Patient Instructions (Signed)
If you are age 66 or older, your body mass index should be between 23-30. Your Body mass index is 33.52 kg/m. If this is out of the aforementioned range listed, please consider follow up with your Primary Care Provider.  If you are age 66 or younger, your body mass index should be between 19-25. Your Body mass index is 33.52 kg/m. If this is out of the aformentioned range listed, please consider follow up with your Primary Care Provider.   You have been scheduled for a colonoscopy. Please follow written instructions given to you at your visit today.  Please pick up your prep supplies at the pharmacy within the next 1-3 days. If you use inhalers (even only as needed), please bring them with you on the day of your procedure.

## 2020-07-15 NOTE — Progress Notes (Signed)
ASSESSMENT AND PLAN    # Bowel changes. Intermittent abdominal pain with associated diarrhea.   --Suspect she has IBS which we talked about. She isn't due for surveillance colonoscopy until Dec 2023 which we discussed. She is very concerned about symptoms given her personal history of colon polyps and [facility] of colon cancer. She would like to proceed with earlier colonoscopy. Patient will be scheduled for a colonoscopy. The risks and benefits of colonoscopy with possible polypectomy / biopsies were discussed and the patient agrees to proceed.  --We discussed a food diary to try and identify triggers --Offered low dose of Bentyl but she wants to wait and try food diary first.     [place]     Primary Gastroenterologist : Silvano Rusk, MD  Chief Complaint : bowel changes and abdominal pain  Su ELMIRA OLKOWSKI is a 64 y.o. 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 pylori gastritis, cholecystectomy  Patient was last seen 2018, at that time for evaluation of dysphagia.  She underwent an EGD which showed a tortuous esophagus.  Esophagus was dilated with a Maloney dilator.  Patient had her gallbladder out a couple of years ago. Following that she developed postprandial diarrhea,  especially with greasy foods. Her bowel movements eventually improved but then about a year ago she began having intermittent episodes of intense abdominal pain followed by diarrhea.  The abdominal pain is dull, it generally starts around the umbilicus, radiates toward the left and downward across lower abdomen.  The pain subsides after defecation. She cannot relate these episodes to any particular food but say she has a lactose intolerance. Episodes are occurring more frequently and has her concerned. She hasn't seen any blood in her stool. Her weight is stable.     Previous Endoscopic Evaluations / Pertinent Studies:   Dec 2018 EGD for dysphagia --Tortuous esophagus, status post Maloney dilation.  A single nonbleeding 8 mm erosion in the prepyloric area  Dec 2018 colonoscopy for family history colon cancer  --Complete exam with a good prep.  A 5 mm cecal polyp was removed.  Exam otherwise normal.  Polyp was a tubular adenoma.  Recommended to have 5-year surveillance colonoscopy given family history.     Past Medical History:  Diagnosis Date  . Allergic rhinitis   . Allergy   . Anxiety state, unspecified   . Arthritis    left thumb  . Carotid bruit   . Cervicalgia   . Diverticulosis   . Fibromyalgia    better since breast implants were removed  . GERD (gastroesophageal reflux disease)   . Helicobacter pylori gastritis 10/02/2017   EGD and bx 12/.2018 Quad tx   . History of migraine headaches   . Hypothyroidism    nodular goiter  . IBS (irritable bowel syndrome)   . Insomnia   . Obesity   . Unspecified vitamin D deficiency     Current Medications, Allergies, Past Surgical History, Family History and Social History were reviewed in [facility] record.   Current Outpatient Medications  Medication Sig Dispense Refill  . baclofen (LIORESAL) 20 MG tablet TAKE ONE TABLET BY MOUTH FOUR TIMES A DAY (Patient taking differently: Take 20 mg by mouth as needed. ) 90 tablet 0  . LORazepam (ATIVAN) 1 MG tablet TAKE ONE TABLET BY MOUTH TWICE A DAY AS NEEDED FOR ANXIETY 60  tablet 0  . methocarbamol (ROBAXIN) 500 MG tablet Take 1 tablet (500 mg total) by mouth every 6 (six) hours as needed for muscle spasms. 90 tablet 1  . thyroid (ARMOUR) 120 MG tablet Take 120 mg by mouth daily.      No current facility-administered medications for this visit.    Review of Systems: No chest pain. No shortness of breath. No urinary complaints.   PHYSICAL EXAM :    Wt Readings from Last 3 Encounters:  07/15/20 214 lb (97.1 kg)   12/24/19 220 lb (99.8 kg)  03/18/19 209 lb 12.8 oz (95.2 kg)    BP 128/76   Pulse 67   Ht 5\' 7"  (1.702 m)   Wt 214 lb (97.1 kg)   SpO2 98%   BMI 33.52 kg/m  Constitutional:  Pleasant female in no acute distress. Psychiatric: Normal mood and affect. Behavior is normal. EENT: Pupils normal.  Conjunctivae are normal. No scleral icterus. Neck supple.  Cardiovascular: Normal rate, regular rhythm. No edema Pulmonary/chest: Effort normal and breath sounds normal. No wheezing, rales or rhonchi. Abdominal: Soft, nondistended, nontender. Bowel sounds active throughout. There are no masses palpable. No hepatomegaly. Neurological: Alert and oriented to person place and time. Skin: Skin is warm and dry. No rashes noted.  Tye Savoy, NP  07/15/2020, 2:19 PM  Cc:  Binnie Rail, MD

## 2020-07-16 ENCOUNTER — Other Ambulatory Visit: Payer: Self-pay | Admitting: Internal Medicine

## 2020-07-26 DIAGNOSIS — M7661 Achilles tendinitis, right leg: Secondary | ICD-10-CM | POA: Diagnosis not present

## 2020-07-26 DIAGNOSIS — M25571 Pain in right ankle and joints of right foot: Secondary | ICD-10-CM | POA: Diagnosis not present

## 2020-07-30 DIAGNOSIS — S92134A Nondisplaced fracture of posterior process of right talus, initial encounter for closed fracture: Secondary | ICD-10-CM | POA: Diagnosis not present

## 2020-08-03 DIAGNOSIS — H353122 Nonexudative age-related macular degeneration, left eye, intermediate dry stage: Secondary | ICD-10-CM | POA: Diagnosis not present

## 2020-08-03 DIAGNOSIS — H43392 Other vitreous opacities, left eye: Secondary | ICD-10-CM | POA: Diagnosis not present

## 2020-08-03 DIAGNOSIS — H43813 Vitreous degeneration, bilateral: Secondary | ICD-10-CM | POA: Diagnosis not present

## 2020-08-03 DIAGNOSIS — H353211 Exudative age-related macular degeneration, right eye, with active choroidal neovascularization: Secondary | ICD-10-CM | POA: Diagnosis not present

## 2020-08-03 NOTE — Patient Instructions (Addendum)
  Blood work was ordered.    All other Health Maintenance issues reviewed.   All recommended immunizations and age-appropriate screenings are up-to-date or discussed.   A bone density scan was ordered for [facility].   No immunization administered today.    Medications reviewed and updated.  Changes include :  none      Please followup in 1 year, sooner if needed

## 2020-08-03 NOTE — Progress Notes (Signed)
Subjective:    Patient ID: Patricia Greene, female    DOB: 01/17/1956, 64 y.o.   MRN: [id number]  HPI The patient is here for follow up of their chronic medical problems, including anxiety, insomnia, back muscle spasms, obesity  She is still caring for her mother and has been doing so for several years.  This has been stressful as her mother's health has declined over the years.  He has not had time to really concentrate on her health.  She does have chronic back issues, especially when she is pushing her mother in the wheelchair or caring for her.  She takes no Robaxin during the day and baclofen at night as needed.  She does not take these on a daily basis, but this does help.  She does feel anxious at times and will take the lorazepam as needed only.  Overall she feels she is doing well with managing her stress.  She did recently see orthopedics for Achilles tendinitis and plantar fasciitis and due to increased pain ended up having imaging and she has a fracture in the heel, which is nontraumatic.  Orthopedic advised her that this is not a stress fracture and that she should have her bone density rechecked.  She has not been taking her vitamin D and calcium on a regular basis.  Medications and allergies reviewed with patient and updated if appropriate.  Patient Active Problem List   Diagnosis Date Noted  . Hyperglycemia 08/04/2020  . Breast lump on left side at 10 o'clock position 12/24/2019  . Obesity 03/17/2019  . 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 Unilateral primary osteoarthritis, left knee 02/21/2018  . Chronic pain of left knee 01/23/2018  . Helicobacter pylori gastritis 10/02/2017  . Hx of adenomatous polyp of colon 09/27/2017  . Closed wedge compression fracture of T11 vertebra ([place]) 03/15/2017  . Reactive airway disease with wheezing 03/14/2017  . Back muscle spasm 10/04/2016  . H/O breast implant 04/15/2016  . Osteopenia 02/01/2015  . Allergic rhinitis, cause  unspecified 03/11/2011  . Vitamin D deficiency 03/13/2010  . Migraine headache 03/13/2010  . CAROTID BRUIT 03/13/2010  . Hypothyroidism 03/09/2010  . INSOMNIA, CHRONIC 02/20/2008  . Anxiety 09/06/2007  . FIBROMYALGIA 09/06/2007  . IRRITABLE BOWEL SYNDROME, HX OF 09/06/2007    Current Outpatient Medications on File Prior to Visit  Medication Sig Dispense Refill  . baclofen (LIORESAL) 20 MG tablet TAKE ONE TABLET BY MOUTH FOUR TIMES A DAY (Patient taking differently: Take 20 mg by mouth as needed. ) 90 tablet 0  . celecoxib (CELEBREX) 200 MG capsule     . HYDROcodone-acetaminophen (NORCO/VICODIN) 5-325 MG tablet hydrocodone 5 mg-acetaminophen 325 mg tablet  Take 1 tablet every 4-6 hours by oral route as needed for pain for 5 days.    Marland Kitchen LORazepam (ATIVAN) 1 MG tablet TAKE ONE TABLET BY MOUTH TWICE A DAY AS NEEDED FOR ANXIETY 60 tablet 0  . methocarbamol (ROBAXIN) 500 MG tablet Take 1 tablet (500 mg total) by mouth every 6 (six) hours as needed for muscle spasms. 90 tablet 1  . thyroid (ARMOUR) 120 MG tablet Take 120 mg by mouth daily.      No current facility-administered medications on file prior to visit.    Past Medical History:  Diagnosis Date  . Allergic rhinitis   . Allergy   . Anxiety state, unspecified   . Arthritis    left thumb  . Carotid bruit   . Cervicalgia   . Diverticulosis   .  Fibromyalgia    better since breast implants were removed  . GERD (gastroesophageal reflux disease)   . Helicobacter pylori gastritis 10/02/2017   EGD and bx 12/.2018 Quad tx   . History of migraine headaches   . Hypothyroidism    nodular goiter  . IBS (irritable bowel syndrome)   . Insomnia   . Obesity   . Unspecified vitamin D deficiency     Past Surgical History:  Procedure Laterality Date  . BREAST ENHANCEMENT SURGERY    . breast implants removed    . CHOLECYSTECTOMY N/A 06/19/2017   Procedure: LAPAROSCOPIC CHOLECYSTECTOMY WITH INTRAOPERATIVE CHOLANGIOGRAM,REPAIR OF DUODENAL  INJURY;  Surgeon: Kieth Brightly, Arta Bruce, MD;  Location: [facility];  Service: General;  Laterality: N/A;  . [place], 2004 Sammuel Cooper), 08/09/2011   1999, 2004: diverticulosis  . finger reattachment    . THYROIDECTOMY  1973   right, toxic nodule  . TONSILLECTOMY      Social History   Socioeconomic History  . Marital status: Divorced    Spouse name: Not on file  . Number of children: 0  . Years of education: Not on file  . Highest education level: Not on file  Occupational History  . Occupation: Therapist, sports  Tobacco Use  . Smoking status: Never Smoker  . Smokeless tobacco: Never Used  Vaping Use  . Vaping Use: Never used  Substance and Sexual Activity  . Alcohol use: No  . Drug use: No  . Sexual activity: Not on file  Other Topics Concern  . Not on file  Social History Narrative   2-3 caffeine drinks daily    Divorced, no children. 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 Difficulty of Paying Living Expenses: Not on file  Food Insecurity:   . Worried About Charity fundraiser in the Last Year: Not on file  . Ran Out of Food in the Last Year: Not on file  Transportation Needs:   . Lack of Transportation (Medical): Not on file  . Lack of Transportation (Non-Medical): Not on file  Physical Activity:   . Days of Exercise per Week: Not on file  . Minutes of Exercise per Session: Not on file  Stress:   . Feeling of Stress : Not on file  Social Connections:   . Frequency of Communication with Friends and Family: Not on file  . Frequency of Social Gatherings with Friends and Family: Not on file  . Attends Religious Services: Not on file  . Active Member of Clubs or Organizations: Not on file  . Attends Archivist Meetings: Not on file  . Marital Status: Not on file    Family History  Problem Relation Age of Onset  . Asthma Mother   . Breast cancer Mother     . Allergic rhinitis Mother   . Heart disease Mother   . Colon cancer Father 24       died at 14  . Colon polyps Sister   . Esophageal cancer Neg Hx   . Stomach cancer Neg Hx   . Rectal cancer Neg Hx   . Pancreatic cancer Neg Hx   . Liver disease Neg Hx     Review of Systems  Constitutional: Negative for chills and fever.  Eyes: Negative for visual disturbance.  Respiratory: Negative for cough, shortness of breath and wheezing.   Cardiovascular: Positive for leg swelling (  occ, mild with increase salt). Negative for chest pain and palpitations.  Gastrointestinal: Positive for abdominal pain (lower abd). Negative for constipation, diarrhea and nausea.       Occ gerd  Genitourinary: Negative for dysuria and hematuria.  Musculoskeletal: Positive for arthralgias and back pain.  Neurological: Negative for light-headedness and headaches.  Psychiatric/Behavioral: Positive for dysphoric mood. The patient is nervous/anxious.        Objective:   Vitals:   08/04/20 1102  BP: 122/76  Pulse: 79  Temp: 98.5 F (36.9 C)  SpO2: 97%   BP Readings from Last 3 Encounters:  08/04/20 122/76  07/15/20 128/76  12/30/19 132/80   Wt Readings from Last 3 Encounters:  07/15/20 214 lb (97.1 kg)  12/24/19 220 lb (99.8 kg)  03/18/19 209 lb 12.8 oz (95.2 kg)   Body mass index is 33.52 kg/m.   Physical Exam    Constitutional: Appears well-developed and well-nourished. No distress.  HENT:  Head: Normocephalic and atraumatic.  Neck: Neck supple. No tracheal deviation present. No thyromegaly present.  No cervical lymphadenopathy Cardiovascular: Normal rate, regular rhythm and normal heart sounds.   No murmur heard. No carotid bruit .  Trace edema right lower extremity, no edema left lower extremity edema Pulmonary/Chest: Effort normal and breath sounds normal. No respiratory distress. No has no wheezes. No rales.  Abd: soft, NT, ND Skin: Skin is warm and dry. Not diaphoretic.  Psychiatric:  Normal mood and affect. Behavior is normal.      Assessment & Plan:   Colonoscopy on Monday for bowel changes and lower abdominal pain.    See Problem List for Assessment and Plan of chronic medical problems.    This visit occurred during the SARS-CoV-2 public health emergency.  Safety protocols were in place, including screening questions prior to the visit, additional usage of staff PPE, and extensive cleaning of exam room while observing appropriate contact time as indicated for disinfecting solutions.

## 2020-08-04 ENCOUNTER — Ambulatory Visit (INDEPENDENT_AMBULATORY_CARE_PROVIDER_SITE_OTHER): Payer: Medicare Other | Admitting: Internal Medicine

## 2020-08-04 ENCOUNTER — Encounter: Payer: Self-pay | Admitting: Internal Medicine

## 2020-08-04 ENCOUNTER — Other Ambulatory Visit: Payer: Self-pay

## 2020-08-04 VITALS — BP 122/76 | HR 79 | Temp 98.5°F | Ht 67.0 in

## 2020-08-04 DIAGNOSIS — M62830 Muscle spasm of back: Secondary | ICD-10-CM | POA: Diagnosis not present

## 2020-08-04 DIAGNOSIS — R739 Hyperglycemia, unspecified: Secondary | ICD-10-CM | POA: Insufficient documentation

## 2020-08-04 DIAGNOSIS — F419 Anxiety disorder, unspecified: Secondary | ICD-10-CM

## 2020-08-04 DIAGNOSIS — E559 Vitamin D deficiency, unspecified: Secondary | ICD-10-CM

## 2020-08-04 DIAGNOSIS — E6609 Other obesity due to excess calories: Secondary | ICD-10-CM

## 2020-08-04 DIAGNOSIS — R7303 Prediabetes: Secondary | ICD-10-CM | POA: Insufficient documentation

## 2020-08-04 DIAGNOSIS — G4700 Insomnia, unspecified: Secondary | ICD-10-CM

## 2020-08-04 DIAGNOSIS — E890 Postprocedural hypothyroidism: Secondary | ICD-10-CM

## 2020-08-04 DIAGNOSIS — M85852 Other specified disorders of bone density and structure, left thigh: Secondary | ICD-10-CM | POA: Diagnosis not present

## 2020-08-04 DIAGNOSIS — Z6832 Body mass index (BMI) 32.0-32.9, adult: Secondary | ICD-10-CM

## 2020-08-04 LAB — TSH: TSH: 0.25 u[IU]/mL — ABNORMAL LOW (ref 0.35–4.50)

## 2020-08-04 LAB — CBC WITH DIFFERENTIAL/PLATELET
Basophils Absolute: 0 10*3/uL (ref 0.0–0.1)
Basophils Relative: 0.3 % (ref 0.0–3.0)
Eosinophils Absolute: 0.1 10*3/uL (ref 0.0–0.7)
Eosinophils Relative: 1.6 % (ref 0.0–5.0)
HCT: 39 % (ref 36.0–46.0)
Hemoglobin: 13.1 g/dL (ref 12.0–15.0)
Lymphocytes Relative: 43.2 % (ref 12.0–46.0)
Lymphs Abs: 1.9 10*3/uL (ref 0.7–4.0)
MCHC: 33.5 g/dL (ref 30.0–36.0)
MCV: 93 fl (ref 78.0–100.0)
Monocytes Absolute: 0.3 10*3/uL (ref 0.1–1.0)
Monocytes Relative: 6.1 % (ref 3.0–12.0)
Neutro Abs: 2.1 10*3/uL (ref 1.4–7.7)
Neutrophils Relative %: 48.8 % (ref 43.0–77.0)
Platelets: 214 10*3/uL (ref 150.0–400.0)
RBC: 4.19 Mil/uL (ref 3.87–5.11)
RDW: 13.8 % (ref 11.5–15.5)
WBC: 4.4 10*3/uL (ref 4.0–10.5)

## 2020-08-04 LAB — COMPREHENSIVE METABOLIC PANEL
ALT: 13 U/L (ref 0–35)
AST: 17 U/L (ref 0–37)
Albumin: 4.3 g/dL (ref 3.5–5.2)
Alkaline Phosphatase: 54 U/L (ref 39–117)
BUN: 13 mg/dL (ref 6–23)
CO2: 27 mEq/L (ref 19–32)
Calcium: 9.4 mg/dL (ref 8.4–10.5)
Chloride: 104 mEq/L (ref 96–112)
Creatinine, Ser: 0.65 mg/dL (ref 0.40–1.20)
GFR: 91.74 mL/min (ref >60.00)
Glucose, Bld: 84 mg/dL (ref 70–99)
Potassium: 3.7 mEq/L (ref 3.5–5.1)
Sodium: 139 mEq/L (ref 135–145)
Total Bilirubin: 0.6 mg/dL (ref 0.2–1.2)
Total Protein: 6.5 g/dL (ref 6.0–8.3)

## 2020-08-04 LAB — HEMOGLOBIN A1C: Hgb A1c MFr Bld: 5.6 % (ref 4.6–6.5)

## 2020-08-04 LAB — T4, FREE: Free T4: 0.7 ng/dL (ref 0.60–1.60)

## 2020-08-04 LAB — VITAMIN D 25 HYDROXY (VIT D DEFICIENCY, FRACTURES): VITD: 34.18 ng/mL (ref 30.00–100.00)

## 2020-08-04 MED ORDER — BACLOFEN 20 MG PO TABS
20.0000 mg | ORAL_TABLET | Freq: Every evening | ORAL | 5 refills | Status: DC | PRN
Start: 2020-08-04 — End: 2021-09-14

## 2020-08-04 MED ORDER — METHOCARBAMOL 500 MG PO TABS: 500.0000 mg | ORAL_TABLET | Freq: Four times a day (QID) | ORAL | 1 refills | Status: DC | PRN

## 2020-08-04 NOTE — Assessment & Plan Note (Signed)
Chronic Check a1c Low sugar / carb diet Stressed regular exercise  

## 2020-08-04 NOTE — Assessment & Plan Note (Signed)
Chronic Taking vitamin d but not regularly Check vitamin d level Stressed that she needs to be taking this on a regular basis

## 2020-08-04 NOTE — Assessment & Plan Note (Signed)
Chronic Intermittent Occasionally takes baclofen at night for muscle spasms, which also helps her sleep Occasionally takes Ativan if she is not taking the baclofen for anxiety and sleep Continue above

## 2020-08-04 NOTE — Assessment & Plan Note (Signed)
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 Consuming all of her time Low sugar/carbohydrate diet, decrease portions

## 2020-08-04 NOTE — Assessment & Plan Note (Signed)
Chronic, Intermittent She has frequent flares because of caring for her mother Takes methocarbamol during the day which she tolerates well and it does not make her drowsy and takes baclofen at night as needed She does not take these on a regular basis-only when needed Continue methocarbamol 500 mg 1-3 times during the day as needed for muscle spasms Continue baclofen 20 mg at bedtime as needed for muscle spasms She does not take the abdomen when she takes these medications

## 2020-08-04 NOTE — Assessment & Plan Note (Signed)
Chronic Last DEXA 1 year ago, but has a new fracture in her heel that is atraumatic and not a stress fracture therefore will order another DEXA scan Not taking calcium and vitamin D regularly-stressed trying to take this on a regular basis Currently not able to exercise due to foot issues

## 2020-08-04 NOTE — Assessment & Plan Note (Signed)
Chronic Intermittent Overall controlled Takes Ativan once daily as needed and does not take it often Continue lorazepam 0.5-1 mg daily as needed

## 2020-08-04 NOTE — Assessment & Plan Note (Signed)
Chronic Following with endocrine, but has not seen him recently Currently taking Armour Thyroid 120 mg daily Clinically euthyroid Check TSH, free T4

## 2020-08-06 ENCOUNTER — Telehealth: Payer: Self-pay

## 2020-08-06 NOTE — Telephone Encounter (Signed)
Left VM to remind pt of procedure on Monday with Dr. Carlean Purl at [facility].

## 2020-08-09 ENCOUNTER — Ambulatory Visit: Payer: Medicare Other | Admitting: Internal Medicine

## 2020-08-09 ENCOUNTER — Other Ambulatory Visit: Payer: Self-pay

## 2020-08-09 DIAGNOSIS — R194 Change in bowel habit: Secondary | ICD-10-CM

## 2020-08-09 NOTE — Progress Notes (Signed)
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 We canceled her appt according to the [facility] policy and rescheduled her with Dr. Carlean Purl on 08/23/20 3pm. She will go for her COVID test on Friday 08/20/20.

## 2020-08-11 DIAGNOSIS — M85852 Other specified disorders of bone density and structure, left thigh: Secondary | ICD-10-CM | POA: Diagnosis not present

## 2020-08-11 DIAGNOSIS — M85851 Other specified disorders of bone density and structure, right thigh: Secondary | ICD-10-CM | POA: Diagnosis not present

## 2020-08-11 LAB — HM DEXA SCAN

## 2020-08-13 ENCOUNTER — Encounter: Payer: Self-pay | Admitting: Internal Medicine

## 2020-08-20 ENCOUNTER — Other Ambulatory Visit: Payer: Self-pay | Admitting: Internal Medicine

## 2020-08-20 DIAGNOSIS — Z1159 Encounter for screening for other viral diseases: Secondary | ICD-10-CM | POA: Diagnosis not present

## 2020-08-20 LAB — SARS CORONAVIRUS 2 (TAT 6-24 HRS): SARS Coronavirus 2: NEGATIVE

## 2020-08-23 ENCOUNTER — Other Ambulatory Visit: Payer: Self-pay

## 2020-08-23 ENCOUNTER — Ambulatory Visit (AMBULATORY_SURGERY_CENTER): Payer: Medicare Other | Admitting: Internal Medicine

## 2020-08-23 ENCOUNTER — Other Ambulatory Visit: Payer: Self-pay | Admitting: Internal Medicine

## 2020-08-23 ENCOUNTER — Encounter: Payer: Self-pay | Admitting: Internal Medicine

## 2020-08-23 VITALS — BP 100/53 | HR 54 | Temp 97.0°F | Resp 12 | Ht 67.0 in | Wt 214.0 lb

## 2020-08-23 DIAGNOSIS — D123 Benign neoplasm of transverse colon: Secondary | ICD-10-CM | POA: Diagnosis not present

## 2020-08-23 DIAGNOSIS — D125 Benign neoplasm of sigmoid colon: Secondary | ICD-10-CM

## 2020-08-23 DIAGNOSIS — R194 Change in bowel habit: Secondary | ICD-10-CM

## 2020-08-23 DIAGNOSIS — Z1211 Encounter for screening for malignant neoplasm of colon: Secondary | ICD-10-CM | POA: Diagnosis not present

## 2020-08-23 HISTORY — PX: COLONOSCOPY: SHX174

## 2020-08-23 MED ORDER — HYOSCYAMINE SULFATE SL 0.125 MG SL SUBL: SUBLINGUAL_TABLET | SUBLINGUAL | 1 refills | Status: DC

## 2020-08-23 MED ORDER — SODIUM CHLORIDE 0.9 % IV SOLN: 500.0000 mL | Freq: Once | INTRAVENOUS | Status: DC

## 2020-08-23 NOTE — Progress Notes (Signed)
A and O x3. Report to RN. Tolerated MAC anesthesia well. 

## 2020-08-23 NOTE — Op Note (Signed)
[place] Patient Name: Patricia Greene Procedure Date: 08/23/2020 3:10 PM MRN: [id number] Endoscopist: Gatha Mayer , MD Age: 64 Referring MD:  Date of Birth: 05-12-1956 Gender: Female Account #: [id number] Procedure:                Colonoscopy Indications:              Periumbilical abdominal pain, Change in bowel habits Medicines:                Propofol per Anesthesia, Monitored Anesthesia Care Procedure:                Pre-Anesthesia Assessment:                           - Prior to the procedure, a History and Physical                            was performed, and patient medications and                            allergies were reviewed. The patient's tolerance of                            previous anesthesia was also reviewed. The risks                            and benefits of the procedure and the sedation                            options and risks were discussed with the patient.                            All questions were answered, and informed consent                            was obtained. Prior Anticoagulants: The patient has                            taken no previous anticoagulant or antiplatelet                            agents. ASA Grade Assessment: II - A patient with                            mild systemic disease. After reviewing the risks                            and benefits, the patient was deemed in                            satisfactory condition to undergo the procedure.                           After obtaining informed consent, the colonoscope  was passed under direct vision. Throughout the                            procedure, the patient's blood pressure, pulse, and                            oxygen saturations were monitored continuously. The                            Colonoscope was introduced through the anus and                            advanced to the the cecum, identified by                             appendiceal orifice and ileocecal valve. The                            colonoscopy was performed without difficulty. The                            patient tolerated the procedure well. The quality                            of the bowel preparation was good. The ileocecal                            valve, appendiceal orifice, and rectum were                            photographed. Scope In: 3:20:26 PM Scope Out: 3:41:22 PM Scope Withdrawal Time: 0 hours 16 minutes 36 seconds  Total Procedure Duration: 0 hours 20 minutes 56 seconds  Findings:                 The perianal and digital rectal examinations were                            normal.                           Two sessile polyps were found in the sigmoid colon                            and transverse colon. The polyps were diminutive in                            size. These polyps were removed with a cold snare.                            Resection and retrieval were complete. Verification                            of patient identification for the specimen was  done. Estimated blood loss was minimal.                           The exam was otherwise without abnormality on                            direct and retroflexion views. Complications:            No immediate complications. Estimated Blood Loss:     Estimated blood loss was minimal. Impression:               - Two diminutive polyps in the sigmoid colon and in                            the transverse colon, removed with a cold snare.                            Resected and retrieved.                           - The examination was otherwise normal on direct                            and retroflexion views. Recommendation:           - Patient has a contact number available for                            emergencies. The signs and symptoms of potential                            delayed complications were discussed with the                             patient. Return to normal activities tomorrow.                            Written discharge instructions were provided to the                            patient.                           - Resume previous diet.                           - Continue present medications.                           - Repeat colonoscopy in 5 years. Hx adenoma and FHx                            CRCA father late 57's                           - hyoscyamine 0.125 mg SL prn abdominal pain Gatha Mayer, MD 08/23/2020 3:57:57  PM This report has been signed electronically.

## 2020-08-23 NOTE — Progress Notes (Signed)
VS by CW. ?

## 2020-08-23 NOTE — Patient Instructions (Addendum)
I found and removed 2 tiny polyps. Nothing bad going on - suspect IBS is acting up.  I will let you know pathology results and when to have another routine colonoscopy by mail and/or My Chart.  Try hyoscyamine for cramps and abdominal pain. You will do better on the cost if you use a Good Rx coupon to buy it as you will need to pay cash - but should not be too expensive.  I appreciate the opportunity to care for you. Gatha Mayer, MD, FACG  YOU HAD AN ENDOSCOPIC PROCEDURE TODAY AT [facility]:   Refer to the procedure report that was given to you for any specific questions about what was found during the examination.  If the procedure report does not answer your questions, please call your gastroenterologist to clarify.  If you requested that your care partner not be given the details of your procedure findings, then the procedure report has been included in a sealed envelope for you to review at your convenience later.  YOU SHOULD EXPECT: Some feelings of bloating in the abdomen. Passage of more gas than usual.  Walking can help get rid of the air that was put into your GI tract during the procedure and reduce the bloating. If you had a lower endoscopy (such as a colonoscopy or flexible sigmoidoscopy) you may notice spotting of blood in your stool or on the toilet paper. If you underwent a bowel prep for your procedure, you may not have a normal bowel movement for a few days.  Please Note:  You might notice some irritation and congestion in your nose or some drainage.  This is from the oxygen used during your procedure.  There is no need for concern and it should clear up in a day or so.  SYMPTOMS TO REPORT IMMEDIATELY:   Following lower endoscopy (colonoscopy or flexible sigmoidoscopy):  Excessive amounts of blood in the stool  Significant tenderness or worsening of abdominal pains  Swelling of the abdomen that is new, acute  Fever of 100F or higher  For urgent or  emergent issues, a gastroenterologist can be reached at any hour by calling [phone]. Do not use MyChart messaging for urgent concerns.    DIET:  We do recommend a small meal at first, but then you may proceed to your regular diet.  Drink plenty of fluids but you should avoid alcoholic beverages for 24 hours.  ACTIVITY:  You should plan to take it easy for the rest of today and you should NOT DRIVE or use heavy machinery until tomorrow (because of the sedation medicines used during the test).    FOLLOW UP: Our staff will call the number listed on your records 48-72 hours following your procedure to check on you and address any questions or concerns that you may have regarding the information given to you following your procedure. If we do not reach you, we will leave a message.  We will attempt to reach you two times.  During this call, we will ask if you have developed any symptoms of COVID 19. If you develop any symptoms (ie: fever, flu-like symptoms, shortness of breath, cough etc.) before then, please call [phone].  If you test positive for Covid 19 in the 2 weeks post procedure, please call and report this information to [place].    If any biopsies were taken you will be contacted by phone or by letter within the next 1-3 weeks.  Please call us at [phone]  [phone] if you have not heard about the biopsies in 3 weeks.    SIGNATURES/CONFIDENTIALITY: You and/or your care partner have signed paperwork which will be entered into your electronic medical record.  These signatures attest to the fact that that the information above on your After Visit Summary has been reviewed and is understood.  Full responsibility of the confidentiality of this discharge information lies with you and/or your care-partner.

## 2020-08-23 NOTE — Progress Notes (Signed)
Called to room to assist during endoscopic procedure.  Patient ID and intended procedure confirmed with present staff. Received instructions for my participation in the procedure from the performing physician.  

## 2020-08-24 ENCOUNTER — Telehealth: Payer: Self-pay | Admitting: *Deleted

## 2020-08-24 NOTE — Telephone Encounter (Signed)
  Follow up Call-  Call back number 08/23/2020  Post procedure Call Back phone  # [phone]  Permission to leave phone message Yes  Some recent data might be hidden     Patient questions:  Do you have a fever, pain , or abdominal swelling? No. Pain Score  0 *  Have you tolerated food without any problems? Yes.    Have you been able to return to your normal activities? Yes.    Do you have any questions about your discharge instructions: Diet   No. Medications  No. Follow up visit  No.  Do you have questions or concerns about your Care? No.  Actions: * If pain score is 4 or above: No action needed, pain <4.  1. Have you developed a fever since your procedure? no  2.   Have you had an respiratory symptoms (SOB or cough) since your procedure? no  3.   Have you tested positive for COVID 19 since your procedure no  4.   Have you had any family members/close contacts diagnosed with the COVID 19 since your procedure?  no   If yes to any of these questions please route to Joylene John, RN and Joella Prince, RN

## 2020-08-31 DIAGNOSIS — M79671 Pain in right foot: Secondary | ICD-10-CM | POA: Diagnosis not present

## 2020-08-31 DIAGNOSIS — S92134D Nondisplaced fracture of posterior process of right talus, subsequent encounter for fracture with routine healing: Secondary | ICD-10-CM | POA: Diagnosis not present

## 2020-09-03 DIAGNOSIS — L821 Other seborrheic keratosis: Secondary | ICD-10-CM | POA: Diagnosis not present

## 2020-09-03 DIAGNOSIS — D1801 Hemangioma of skin and subcutaneous tissue: Secondary | ICD-10-CM | POA: Diagnosis not present

## 2020-09-03 DIAGNOSIS — D239 Other benign neoplasm of skin, unspecified: Secondary | ICD-10-CM | POA: Diagnosis not present

## 2020-09-03 DIAGNOSIS — L814 Other melanin hyperpigmentation: Secondary | ICD-10-CM | POA: Diagnosis not present

## 2020-09-03 DIAGNOSIS — Z86018 Personal history of other benign neoplasm: Secondary | ICD-10-CM | POA: Diagnosis not present

## 2020-09-03 DIAGNOSIS — L738 Other specified follicular disorders: Secondary | ICD-10-CM | POA: Diagnosis not present

## 2020-09-03 DIAGNOSIS — L720 Epidermal cyst: Secondary | ICD-10-CM | POA: Diagnosis not present

## 2020-09-03 DIAGNOSIS — D229 Melanocytic nevi, unspecified: Secondary | ICD-10-CM | POA: Diagnosis not present

## 2020-09-05 ENCOUNTER — Encounter: Payer: Self-pay | Admitting: Internal Medicine

## 2020-09-09 ENCOUNTER — Encounter: Payer: Self-pay | Admitting: Internal Medicine

## 2020-09-09 NOTE — Progress Notes (Signed)
Outside notes received. Information abstracted. Notes sent to scan.  

## 2020-09-21 DIAGNOSIS — Z20822 Contact with and (suspected) exposure to covid-19: Secondary | ICD-10-CM | POA: Diagnosis not present

## 2020-09-21 DIAGNOSIS — M791 Myalgia, unspecified site: Secondary | ICD-10-CM | POA: Diagnosis not present

## 2020-09-22 DIAGNOSIS — H353122 Nonexudative age-related macular degeneration, left eye, intermediate dry stage: Secondary | ICD-10-CM | POA: Diagnosis not present

## 2020-09-22 DIAGNOSIS — H43392 Other vitreous opacities, left eye: Secondary | ICD-10-CM | POA: Diagnosis not present

## 2020-09-22 DIAGNOSIS — H353211 Exudative age-related macular degeneration, right eye, with active choroidal neovascularization: Secondary | ICD-10-CM | POA: Diagnosis not present

## 2020-09-22 DIAGNOSIS — H43813 Vitreous degeneration, bilateral: Secondary | ICD-10-CM | POA: Diagnosis not present

## 2020-09-28 DIAGNOSIS — R928 Other abnormal and inconclusive findings on diagnostic imaging of breast: Secondary | ICD-10-CM | POA: Diagnosis not present

## 2020-09-28 DIAGNOSIS — M79671 Pain in right foot: Secondary | ICD-10-CM | POA: Diagnosis not present

## 2020-09-28 DIAGNOSIS — S92134D Nondisplaced fracture of posterior process of right talus, subsequent encounter for fracture with routine healing: Secondary | ICD-10-CM | POA: Diagnosis not present

## 2020-09-28 LAB — HM MAMMOGRAPHY

## 2020-09-29 ENCOUNTER — Other Ambulatory Visit: Payer: Self-pay | Admitting: Sports Medicine

## 2020-09-29 DIAGNOSIS — S92134D Nondisplaced fracture of posterior process of right talus, subsequent encounter for fracture with routine healing: Secondary | ICD-10-CM

## 2020-10-14 ENCOUNTER — Ambulatory Visit
Admission: RE | Admit: 2020-10-14 | Discharge: 2020-10-14 | Disposition: A | Discharge status: Home / Self Care | Payer: Medicare Other | Source: Ambulatory Visit | Attending: Sports Medicine | Admitting: Sports Medicine

## 2020-10-14 ENCOUNTER — Other Ambulatory Visit: Payer: Self-pay

## 2020-10-14 DIAGNOSIS — S92134A Nondisplaced fracture of posterior process of right talus, initial encounter for closed fracture: Secondary | ICD-10-CM | POA: Diagnosis not present

## 2020-10-14 DIAGNOSIS — S92134D Nondisplaced fracture of posterior process of right talus, subsequent encounter for fracture with routine healing: Secondary | ICD-10-CM

## 2020-10-14 DIAGNOSIS — Q666 Other congenital valgus deformities of feet: Secondary | ICD-10-CM | POA: Diagnosis not present

## 2020-10-14 DIAGNOSIS — S92101D Unspecified fracture of right talus, subsequent encounter for fracture with routine healing: Secondary | ICD-10-CM | POA: Diagnosis not present

## 2020-10-16 ENCOUNTER — Other Ambulatory Visit: Payer: Medicare Other

## 2020-10-16 DIAGNOSIS — Z20822 Contact with and (suspected) exposure to covid-19: Secondary | ICD-10-CM | POA: Diagnosis not present

## 2020-10-18 ENCOUNTER — Encounter: Payer: Self-pay | Admitting: Internal Medicine

## 2020-10-18 MED ORDER — METHOCARBAMOL 500 MG PO TABS: 500.0000 mg | ORAL_TABLET | Freq: Four times a day (QID) | ORAL | 1 refills | Status: DC | PRN

## 2020-10-18 MED ORDER — LORAZEPAM 1 MG PO TABS: ORAL_TABLET | ORAL | 0 refills | Status: DC

## 2020-10-19 LAB — NOVEL CORONAVIRUS, NAA: SARS-CoV-2, NAA: DETECTED — AB

## 2020-10-29 ENCOUNTER — Encounter: Payer: Self-pay | Admitting: Internal Medicine

## 2020-10-29 NOTE — Progress Notes (Signed)
Outside notes received. Information abstracted. Notes sent to scan.  

## 2020-11-11 DIAGNOSIS — M79671 Pain in right foot: Secondary | ICD-10-CM | POA: Diagnosis not present

## 2020-11-11 DIAGNOSIS — S92134K Nondisplaced fracture of posterior process of right talus, subsequent encounter for fracture with nonunion: Secondary | ICD-10-CM | POA: Diagnosis not present

## 2020-11-17 DIAGNOSIS — H353122 Nonexudative age-related macular degeneration, left eye, intermediate dry stage: Secondary | ICD-10-CM | POA: Diagnosis not present

## 2020-11-17 DIAGNOSIS — H43392 Other vitreous opacities, left eye: Secondary | ICD-10-CM | POA: Diagnosis not present

## 2020-11-17 DIAGNOSIS — H43813 Vitreous degeneration, bilateral: Secondary | ICD-10-CM | POA: Diagnosis not present

## 2020-11-17 DIAGNOSIS — H353211 Exudative age-related macular degeneration, right eye, with active choroidal neovascularization: Secondary | ICD-10-CM | POA: Diagnosis not present

## 2020-11-23 ENCOUNTER — Ambulatory Visit (INDEPENDENT_AMBULATORY_CARE_PROVIDER_SITE_OTHER): Payer: Medicare Other

## 2020-11-23 ENCOUNTER — Other Ambulatory Visit: Payer: Self-pay

## 2020-11-23 VITALS — BP 120/82 | HR 70 | Temp 98.3°F | Ht 67.0 in | Wt 205.6 lb

## 2020-11-23 DIAGNOSIS — Z0000 Encounter for general adult medical examination without abnormal findings: Secondary | ICD-10-CM | POA: Diagnosis not present

## 2020-11-23 NOTE — Patient Instructions (Signed)
Patricia Greene , Thank you for taking time to come for your Medicare Wellness Visit. I appreciate your ongoing commitment to your health goals. Please review the following plan we discussed and let me know if I can assist you in the future.   Screening recommendations/referrals: Colonoscopy: 08/23/2020; due every 5 years  Mammogram: 09/28/2020 Bone Density: 08/11/2020; due every 2 years Recommended yearly ophthalmology/optometry visit for glaucoma screening and checkup Recommended yearly dental visit for hygiene and checkup  Vaccinations: Influenza vaccine: declined Pneumococcal vaccine: declined Tdap vaccine: 05/21/2012; due every 10 years Shingles vaccine: declined   Covid-19: declined  Advanced directives: Advance directive discussed with you today. Even though you declined this today please call our office should you change your mind and we can give you the proper paperwork for you to fill out.  Conditions/risks identified: Yes; Reviewed health maintenance screenings with patient today and relevant education, vaccines, and/or referrals were provided. Please continue to do your personal lifestyle choices by: daily care of teeth and gums, regular physical activity (goal should be 5 days a week for 30 minutes), eat a healthy diet, avoid tobacco and drug use, limiting any alcohol intake, taking a low-dose aspirin (if not allergic or have been advised by your provider otherwise) and taking vitamins and minerals as recommended by your provider. Continue doing brain stimulating activities (puzzles, reading, adult coloring books, staying active) to keep memory sharp. Continue to eat heart healthy diet (full of fruits, vegetables, whole grains, lean protein, water--limit salt, fat, and sugar intake) and increase physical activity as tolerated.  Next appointment: Please schedule your next Medicare Wellness Visit with your Nurse Health Advisor in 1 year by calling [phone].  Preventive Care 81 Years  and Older, Female Preventive care refers to lifestyle choices and visits with your health care provider that can promote health and wellness. What does preventive care include?  A yearly physical exam. This is also called an annual well check.  Dental exams once or twice a year.  Routine eye exams. Ask your health care provider how often you should have your eyes checked.  Personal lifestyle choices, including:  Daily care of your teeth and gums.  Regular physical activity.  Eating a healthy diet.  Avoiding tobacco and drug use.  Limiting alcohol use.  Practicing safe sex.  Taking low-dose aspirin every day.  Taking vitamin and mineral supplements as recommended by your health care provider. What happens during an annual well check? The services and screenings done by your health care provider during your annual well check will depend on your age, overall health, lifestyle risk factors, and family history of disease. Counseling  Your health care provider may ask you questions about your:  Alcohol use.  Tobacco use.  Drug use.  Emotional well-being.  Home and relationship well-being.  Sexual activity.  Eating habits.  History of falls.  Memory and ability to understand (cognition).  Work and work Statistician.  Reproductive health. Screening  You may have the following tests or measurements:  Height, weight, and BMI.  Blood pressure.  Lipid and cholesterol levels. These may be checked every 5 years, or more frequently if you are over 9 years old.  Skin check.  Lung cancer screening. You may have this screening every year starting at age 28 if you have a 30-pack-year history of smoking and currently smoke or have quit within the past 15 years.  Fecal occult blood test (FOBT) of the stool. You may have this test every year starting at age  50.  Flexible sigmoidoscopy or colonoscopy. You may have a sigmoidoscopy every 5 years or a colonoscopy every 10  years starting at age 49.  Hepatitis C blood test.  Hepatitis B blood test.  Sexually transmitted disease (STD) testing.  Diabetes screening. This is done by checking your blood sugar (glucose) after you have not eaten for a while (fasting). You may have this done every 1-3 years.  Bone density scan. This is done to screen for osteoporosis. You may have this done starting at age 61.  Mammogram. This may be done every 1-2 years. Talk to your health care provider about how often you should have regular mammograms. Talk with your health care provider about your test results, treatment options, and if necessary, the need for more tests. Vaccines  Your health care provider may recommend certain vaccines, such as:  Influenza vaccine. This is recommended every year.  Tetanus, diphtheria, and acellular pertussis (Tdap, Td) vaccine. You may need a Td booster every 10 years.  Zoster vaccine. You may need this after age 34.  Pneumococcal 13-valent conjugate (PCV13) vaccine. One dose is recommended after age 10.  Pneumococcal polysaccharide (PPSV23) vaccine. One dose is recommended after age 70. Talk to your health care provider about which screenings and vaccines you need and how often you need them. This information is not intended to replace advice given to you by your health care provider. Make sure you discuss any questions you have with your health care provider. Document Released: 10/15/2015 Document Revised: 06/07/2016 Document Reviewed: 07/20/2015 Elsevier Interactive Patient Education  2017 [place] Prevention in the Home Falls can cause injuries. They can happen to people of all ages. There are many things you can do to make your home safe and to help prevent falls. What can I do on the outside of my home?  Regularly fix the edges of walkways and driveways and fix any cracks.  Remove anything that might make you trip as you walk through a door, such as a raised step or  threshold.  Trim any bushes or trees on the path to your home.  Use bright outdoor lighting.  Clear any walking paths of anything that might make someone trip, such as rocks or tools.  Regularly check to see if handrails are loose or broken. Make sure that both sides of any steps have handrails.  Any raised decks and porches should have guardrails on the edges.  Have any leaves, snow, or ice cleared regularly.  Use sand or salt on walking paths during winter.  Clean up any spills in your garage right away. This includes oil or grease spills. What can I do in the bathroom?  Use night lights.  Install grab bars by the toilet and in the tub and shower. Do not use towel bars as grab bars.  Use non-skid mats or decals in the tub or shower.  If you need to sit down in the shower, use a plastic, non-slip stool.  Keep the floor dry. Clean up any water that spills on the floor as soon as it happens.  Remove soap buildup in the tub or shower regularly.  Attach bath mats securely with double-sided non-slip rug tape.  Do not have throw rugs and other things on the floor that can make you trip. What can I do in the bedroom?  Use night lights.  Make sure that you have a light by your bed that is easy to reach.  Do not use any sheets  or blankets that are too big for your bed. They should not hang down onto the floor.  Have a firm chair that has side arms. You can use this for support while you get dressed.  Do not have throw rugs and other things on the floor that can make you trip. What can I do in the kitchen?  Clean up any spills right away.  Avoid walking on wet floors.  Keep items that you use a lot in easy-to-reach places.  If you need to reach something above you, use a strong step stool that has a grab bar.  Keep electrical cords out of the way.  Do not use floor polish or wax that makes floors slippery. If you must use wax, use non-skid floor wax.  Do not have  throw rugs and other things on the floor that can make you trip. What can I do with my stairs?  Do not leave any items on the stairs.  Make sure that there are handrails on both sides of the stairs and use them. Fix handrails that are broken or loose. Make sure that handrails are as long as the stairways.  Check any carpeting to make sure that it is firmly attached to the stairs. Fix any carpet that is loose or worn.  Avoid having throw rugs at the top or bottom of the stairs. If you do have throw rugs, attach them to the floor with carpet tape.  Make sure that you have a light switch at the top of the stairs and the bottom of the stairs. If you do not have them, ask someone to add them for you. What else can I do to help prevent falls?  Wear shoes that:  Do not have high heels.  Have rubber bottoms.  Are comfortable and fit you well.  Are closed at the toe. Do not wear sandals.  If you use a stepladder:  Make sure that it is fully opened. Do not climb a closed stepladder.  Make sure that both sides of the stepladder are locked into place.  Ask someone to hold it for you, if possible.  Clearly mark and make sure that you can see:  Any grab bars or handrails.  First and last steps.  Where the edge of each step is.  Use tools that help you move around (mobility aids) if they are needed. These include:  Canes.  Walkers.  Scooters.  Crutches.  Turn on the lights when you go into a dark area. Replace any light bulbs as soon as they burn out.  Set up your furniture so you have a clear path. Avoid moving your furniture around.  If any of your floors are uneven, fix them.  If there are any pets around you, be aware of where they are.  Review your medicines with your doctor. Some medicines can make you feel dizzy. This can increase your chance of falling. Ask your doctor what other things that you can do to help prevent falls. This information is not intended to  replace advice given to you by your health care provider. Make sure you discuss any questions you have with your health care provider. Document Released: 07/15/2009 Document Revised: 02/24/2016 Document Reviewed: 10/23/2014 Elsevier Interactive Patient Education  2017 [employer].

## 2020-11-23 NOTE — Progress Notes (Signed)
Subjective:   Patricia Greene is a 65 y.o. female who presents for an Initial Medicare Annual Wellness Visit.  Review of Systems    No ROS. Medicare Wellness Visit. Additional risk factors are reflected in social history. Cardiac Risk Factors include: advanced age (>26men, >70 women);family history of premature cardiovascular disease;obesity (BMI >30kg/m2)     Objective:    Today's Vitals   11/23/20 1107  BP: 120/82  Pulse: 70  Temp: 98.3 F (36.8 C)  SpO2: 98%  Weight: 205 lb 9.6 oz (93.3 kg)  Height: 5\' 7"  (1.702 m)  PainSc: 0-No pain   Body mass index is 32.2 kg/m.  Advanced Directives 11/23/2020 08/23/2020 09/27/2017 06/19/2017 06/15/2017  Does Patient Have a Medical Advance Directive? No No No No No  Would patient like information on creating a medical advance directive? No - Patient declined No - Patient declined - No - Patient declined -    Current Medications (verified) Outpatient Encounter Medications as of 11/23/2020  Medication Sig  . baclofen (LIORESAL) 20 MG tablet Take 1 tablet (20 mg total) by mouth at bedtime as needed.  . celecoxib (CELEBREX) 200 MG capsule   . LORazepam (ATIVAN) 1 MG tablet TAKE ONE TABLET BY MOUTH TWICE A DAY AS NEEDED FOR ANXIETY  . methocarbamol (ROBAXIN) 500 MG tablet Take 1 tablet (500 mg total) by mouth every 6 (six) hours as needed for muscle spasms.  Marland Kitchen thyroid (ARMOUR) 120 MG tablet Take 120 mg by mouth daily.   Marland Kitchen HYDROcodone-acetaminophen (NORCO/VICODIN) 5-325 MG tablet hydrocodone 5 mg-acetaminophen 325 mg tablet  Take 1 tablet every 4-6 hours by oral route as needed for pain for 5 days. (Patient not taking: Reported on 11/23/2020)  . Hyoscyamine Sulfate SL 0.125 MG SUBL 1-2 every 4 hours as needed for abdominal cramps (Patient not taking: Reported on 11/23/2020)   No facility-administered encounter medications on file as of 11/23/2020.    Allergies (verified) Cortisone and Silicone   History: Past Medical History:   Diagnosis Date  . Allergic rhinitis   . Allergy   . Anxiety state, unspecified   . Arthritis    left thumb  . Carotid bruit   . Cervicalgia   . Diverticulosis   . Fibromyalgia    better since breast implants were removed  . GERD (gastroesophageal reflux disease)   . Helicobacter pylori gastritis 10/02/2017   EGD and bx 12/.2018 Quad tx   . History of migraine headaches   . Hypothyroidism    nodular goiter  . IBS (irritable bowel syndrome)   . Insomnia   . Obesity   . Unspecified vitamin D deficiency    Past Surgical History:  Procedure Laterality Date  . BREAST ENHANCEMENT SURGERY    . breast implants removed    . CHOLECYSTECTOMY N/A 06/19/2017   Procedure: LAPAROSCOPIC CHOLECYSTECTOMY WITH INTRAOPERATIVE CHOLANGIOGRAM,REPAIR OF DUODENAL INJURY;  Surgeon: Kieth Brightly, Arta Bruce, MD;  Location: [facility];  Service: General;  Laterality: N/A;  . [place], 2004 Sammuel Cooper), 08/09/2011   1999, 2004: diverticulosis  . COLONOSCOPY  08/23/2020   Carlean Purl  . finger reattachment    . THYROIDECTOMY  1973   right, toxic nodule  . TONSILLECTOMY     Family History  Problem Relation Age of Onset  . Asthma Mother   . Breast cancer Mother   . Allergic rhinitis Mother   . Heart disease Mother   . Colon cancer Father 70       died at 8  .  Colon polyps Sister   . Esophageal cancer Neg Hx   . Stomach cancer Neg Hx   . Rectal cancer Neg Hx   . Pancreatic cancer Neg Hx   . Liver disease Neg Hx    Social History   Socioeconomic History  . Marital status: Divorced    Spouse name: Not on file  . Number of children: 0  . Years of education: Not on file  . Highest education level: Not on file  Occupational History  . Occupation: Therapist, sports  Tobacco Use  . Smoking status: Never Smoker  . Smokeless tobacco: Never Used  Vaping Use  . Vaping Use: Never used  Substance and Sexual Activity  . Alcohol use: No  . Drug use: No  . Sexual activity: Not on file  Other Topics Concern  . Not  on file  Social History Narrative   2-3 caffeine drinks daily    Divorced, no children. 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 Difficulty of Paying Living Expenses: Not hard at all  Food Insecurity: No Food Insecurity  . Worried About Charity fundraiser in the Last Year: Never true  . Ran Out of Food in the Last Year: Never true  Transportation Needs: No Transportation Needs  . Lack of Transportation (Medical): No  . Lack of Transportation (Non-Medical): No  Physical Activity: Inactive  . Days of Exercise per Week: 0 days  . Minutes of Exercise per Session: 0 min  Stress: No Stress Concern Present  . Feeling of Stress : Not at all  Social Connections: Socially Isolated  . Frequency of Communication with Friends and Family: More than three times a week  . Frequency of Social Gatherings with Friends and Family: Once a week  . Attends Religious Services: Never  . Active Member of Clubs or Organizations: No  . Attends Archivist Meetings: Never  . Marital Status: Divorced    Tobacco Counseling Counseling given: Not Answered   Clinical Intake:  Pre-visit preparation completed: Yes  Pain : No/denies pain Pain Score: 0-No pain     BMI - recorded: 32.2 Nutritional Status: BMI 25 -29 Overweight Nutritional Risks: None Diabetes: No  How often do you need to have someone help you when you read instructions, pamphlets, or other written materials from your doctor or pharmacy?: 1 - Never What is the last grade level you completed in school?: Associate's Degree in Nursing (RN)  Diabetic? no  Interpreter Needed?: No  Information entered by :: Lisette Abu, LPN   Activities of Daily Living In your present state of health, do you have any difficulty performing the following activities: 11/23/2020 08/04/2020  Hearing? N N  Vision? N N  Difficulty  concentrating or making decisions? N N  Walking or climbing stairs? N N  Dressing or bathing? N N  Doing errands, shopping? N N  Preparing Food and eating ? N -  Using the Toilet? N -  In the past six months, have you accidently leaked urine? N -  Do you have problems with loss of bowel control? N -  Managing your Medications? N -  Managing your Finances? N -  Housekeeping or managing your Housekeeping? N -  Some recent data might be hidden    Patient Care Team: Binnie Rail, MD as PCP - General (Internal Medicine) Gatha Mayer, MD as Consulting  Physician (Gastroenterology) Cheri Fowler, MD as Consulting Physician (Obstetrics and Gynecology) Bo Merino, MD as Consulting Physician (Rheumatology) Reynold Bowen, MD as Consulting Physician (Endocrinology) Odette Fraction (Optometry) Starling Manns, MD (Orthopedic Surgery)  Indicate any recent Medical Services you may have received from other than [facility] providers in the past year (date may be approximate).     Assessment:   This is a routine wellness examination for Guila.  Hearing/Vision screen No exam data present  Dietary issues and exercise activities discussed: Current Exercise Habits: The patient does not participate in regular exercise at present, Exercise limited by: None identified  Goals   None    Depression Screen PHQ 2/9 Scores 11/23/2020 03/19/2019 09/06/2018  PHQ - 2 Score 0 0 0  PHQ- 9 Score - 3 -    Fall Risk Fall Risk  11/23/2020 12/24/2019 03/18/2019  Falls in the past year? 0 0 0  Number falls in past yr: 0 0 0  Injury with Fall? 0 0 -  Risk for fall due to : No Fall Risks - -    FALL RISK PREVENTION PERTAINING TO THE HOME:  Any stairs in or around the home? No  If so, are there any without handrails? No  Home free of loose throw rugs in walkways, pet beds, electrical cords, etc? Yes  Adequate lighting in your home to reduce risk of falls? Yes   ASSISTIVE DEVICES UTILIZED TO PREVENT  FALLS:  Life alert? No  Use of a cane, walker or w/c? No  Grab bars in the bathroom? No  Shower chair or bench in shower? No  Elevated toilet seat or a handicapped toilet? No   TIMED UP AND GO:  Was the test performed? No .  Length of time to ambulate 10 feet: 0 sec.   Gait steady and fast without use of assistive device  Cognitive Function: Normal cognitive status assessed by direct observation by this Nurse Health Advisor. No abnormalities found.          Immunizations Immunization History  Administered Date(s) Administered  . Influenza,trivalent, recombinat, inj, PF 06/30/2015    TDAP status: Up to date  Flu Vaccine status: Declined, Education has been provided regarding the importance of this vaccine but patient still declined. Advised may receive this vaccine at local pharmacy or Health Dept. Aware to provide a copy of the vaccination record if obtained from local pharmacy or Health Dept. Verbalized acceptance and understanding.  Pneumococcal vaccine status: Declined,  Education has been provided regarding the importance of this vaccine but patient still declined. Advised may receive this vaccine at local pharmacy or Health Dept. Aware to provide a copy of the vaccination record if obtained from local pharmacy or Health Dept. Verbalized acceptance and understanding.   Covid-19 vaccine status: Declined, Education has been provided regarding the importance of this vaccine but patient still declined. Advised may receive this vaccine at local pharmacy or Health Dept.or vaccine clinic. Aware to provide a copy of the vaccination record if obtained from local pharmacy or Health Dept. Verbalized acceptance and understanding.  Qualifies for Shingles Vaccine? Yes   Zostavax completed No   Shingrix Completed?: No.    Education has been provided regarding the importance of this vaccine. Patient has been advised to call insurance company to determine out of pocket expense if they have  not yet received this vaccine. Advised may also receive vaccine at local pharmacy or Health Dept. Verbalized acceptance and understanding.  Screening Tests Health Maintenance  Topic Date  Due  . COVID-19 Vaccine (1) Never done  . INFLUENZA VACCINE  12/30/2020 (Originally 05/02/2020)  . PNA vac Low Risk Adult (1 of 2 - PCV13) 08/04/2021 (Originally 03/02/2019)  . TETANUS/TDAP  11/30/2020  . DEXA SCAN  08/11/2022  . MAMMOGRAM  09/28/2022  . COLONOSCOPY (Pts 45-73yrs Insurance coverage will need to be confirmed)  08/23/2025  . Hepatitis C Screening  Completed    Health Maintenance  Health Maintenance Due  Topic Date Due  . COVID-19 Vaccine (1) Never done    Colorectal cancer screening: Type of screening: Colonoscopy. Completed 08/23/2020. Repeat every 5 years  Mammogram status: Completed 09/28/2020. Repeat every year  Bone Density status: Completed 08/11/2020. Results reflect: Bone density results: OSTEOPENIA. Repeat every 2 years.  Lung Cancer Screening: (Low Dose CT Chest recommended if Age 53-80 years, 30 pack-year currently smoking OR have quit w/in 15years.) does not qualify.   Lung Cancer Screening Referral: no  Additional Screening:  Hepatitis C Screening: does qualify; Completed yes  Vision Screening: Recommended annual ophthalmology exams for early detection of glaucoma and other disorders of the eye. Is the patient up to date with their annual eye exam?  No  Who is the provider or what is the name of the office in which the patient attends annual eye exams? Information was provided to patient [facility]) If pt is not established with a provider, would they like to be referred to a provider to establish care? No .   Dental Screening: Recommended annual dental exams for proper oral hygiene  Community Resource Referral / Chronic Care Management: CRR required this visit?  No   CCM required this visit?  No      Plan:     I have personally reviewed and  noted the following in the patient's chart:   . Medical and social history . Use of alcohol, tobacco or illicit drugs  . Current medications and supplements . Functional ability and status . Nutritional status . Physical activity . Advanced directives . List of other physicians . Hospitalizations, surgeries, and ER visits in previous 12 months . Vitals . Screenings to include cognitive, depression, and falls . Referrals and appointments  In addition, I have reviewed and discussed with patient certain preventive protocols, quality metrics, and best practice recommendations. A written personalized care plan for preventive services as well as general preventive health recommendations were provided to patient.     Sheral Flow, LPN   2/33/4356   Nurse Notes:  Medications reviewed with patient; no opioid use noted.

## 2020-12-07 DIAGNOSIS — M797 Fibromyalgia: Secondary | ICD-10-CM | POA: Diagnosis not present

## 2020-12-07 DIAGNOSIS — M2550 Pain in unspecified joint: Secondary | ICD-10-CM | POA: Diagnosis not present

## 2020-12-07 DIAGNOSIS — L659 Nonscarring hair loss, unspecified: Secondary | ICD-10-CM | POA: Diagnosis not present

## 2020-12-07 DIAGNOSIS — N951 Menopausal and female climacteric states: Secondary | ICD-10-CM | POA: Diagnosis not present

## 2020-12-07 DIAGNOSIS — K589 Irritable bowel syndrome without diarrhea: Secondary | ICD-10-CM | POA: Diagnosis not present

## 2020-12-07 DIAGNOSIS — E890 Postprocedural hypothyroidism: Secondary | ICD-10-CM | POA: Diagnosis not present

## 2020-12-07 DIAGNOSIS — B9681 Helicobacter pylori [H. pylori] as the cause of diseases classified elsewhere: Secondary | ICD-10-CM | POA: Diagnosis not present

## 2020-12-07 DIAGNOSIS — E6609 Other obesity due to excess calories: Secondary | ICD-10-CM | POA: Diagnosis not present

## 2020-12-07 NOTE — Progress Notes (Signed)
Subjective:    Patient ID: Patricia Greene, female    DOB: 31-Jul-1956, 65 y.o.   MRN: [id number]  HPI The patient is here for an acute visit.  Fluid in ears for two months - it is just the left ear - it started with covid.  It will occasionally hurt and she will feel pain radiate down her neck.  Typically her symptoms are at night not so much during the day.    She has taken flonase and sudafed w/o improvement in her symptoms.   Medications and allergies reviewed with patient and updated if appropriate.  Patient Active Problem List   Diagnosis Date Noted  . ETD (Eustachian tube dysfunction), left 12/08/2020  . Hyperglycemia 08/04/2020  . Obesity 03/17/2019  . 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 Unilateral primary osteoarthritis, left knee 02/21/2018  . Chronic pain of left knee 01/23/2018  . Helicobacter pylori gastritis 10/02/2017  . Hx of adenomatous polyp of colon 09/27/2017  . Closed wedge compression fracture of T11 vertebra ([place]) 03/15/2017  . Reactive airway disease with wheezing 03/14/2017  . Back muscle spasm 10/04/2016  . H/O breast implant 04/15/2016  . Osteopenia 02/01/2015  . Allergic rhinitis, cause unspecified 03/11/2011  . Vitamin D deficiency 03/13/2010  . Migraine headache 03/13/2010  . CAROTID BRUIT 03/13/2010  . Hypothyroidism 03/09/2010  . INSOMNIA, CHRONIC 02/20/2008  . Anxiety 09/06/2007  . FIBROMYALGIA 09/06/2007  . IRRITABLE BOWEL SYNDROME, HX OF 09/06/2007    Current Outpatient Medications on File Prior to Visit  Medication Sig Dispense Refill  . baclofen (LIORESAL) 20 MG tablet Take 1 tablet (20 mg total) by mouth at bedtime as needed. 30 tablet 5  . celecoxib (CELEBREX) 200 MG capsule     . LORazepam (ATIVAN) 1 MG tablet TAKE ONE TABLET BY MOUTH TWICE A DAY AS NEEDED FOR ANXIETY 60 tablet 0  . methocarbamol (ROBAXIN) 500 MG tablet Take 1 tablet (500 mg total) by mouth every 6 (six) hours as needed for muscle spasms. 90 tablet 1  .  thyroid (ARMOUR) 120 MG tablet Take 120 mg by mouth daily.     Marland Kitchen Hyoscyamine Sulfate SL 0.125 MG SUBL 1-2 every 4 hours as needed for abdominal cramps (Patient not taking: No sig reported) 60 tablet 1   No current facility-administered medications on file prior to visit.    Past Medical History:  Diagnosis Date  . Allergic rhinitis   . Allergy   . Anxiety state, unspecified   . Arthritis    left thumb  . Carotid bruit   . Cervicalgia   . Diverticulosis   . Fibromyalgia    better since breast implants were removed  . GERD (gastroesophageal reflux disease)   . Helicobacter pylori gastritis 10/02/2017   EGD and bx 12/.2018 Quad tx   . History of migraine headaches   . Hypothyroidism    nodular goiter  . IBS (irritable bowel syndrome)   . Insomnia   . Obesity   . Unspecified vitamin D deficiency     Past Surgical History:  Procedure Laterality Date  . BREAST ENHANCEMENT SURGERY    . breast implants removed    . CHOLECYSTECTOMY N/A 06/19/2017   Procedure: LAPAROSCOPIC CHOLECYSTECTOMY WITH INTRAOPERATIVE CHOLANGIOGRAM,REPAIR OF DUODENAL INJURY;  Surgeon: Kieth Brightly, Arta Bruce, MD;  Location: [facility];  Service: General;  Laterality: N/A;  . [place], 2004 Sammuel Cooper), 08/09/2011   1999, 2004: diverticulosis  . COLONOSCOPY  08/23/2020   Carlean Purl  . finger reattachment    .  THYROIDECTOMY  1973   right, toxic nodule  . TONSILLECTOMY      Social History   Socioeconomic History  . Marital status: Divorced    Spouse name: Not on file  . Number of children: 0  . Years of education: Not on file  . Highest education level: Not on file  Occupational History  . Occupation: Therapist, sports  Tobacco Use  . Smoking status: Never Smoker  . Smokeless tobacco: Never Used  Vaping Use  . Vaping Use: Never used  Substance and Sexual Activity  . Alcohol use: No  . Drug use: No  . Sexual activity: Not on file  Other Topics Concern  . Not on file  Social History Narrative   2-3 caffeine  drinks daily    Divorced, no children. 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 Difficulty of Paying Living Expenses: Not hard at all  Food Insecurity: No Food Insecurity  . Worried About Charity fundraiser in the Last Year: Never true  . Ran Out of Food in the Last Year: Never true  Transportation Needs: No Transportation Needs  . Lack of Transportation (Medical): No  . Lack of Transportation (Non-Medical): No  Physical Activity: Inactive  . Days of Exercise per Week: 0 days  . Minutes of Exercise per Session: 0 min  Stress: No Stress Concern Present  . Feeling of Stress : Not at all  Social Connections: Socially Isolated  . Frequency of Communication with Friends and Family: More than three times a week  . Frequency of Social Gatherings with Friends and Family: Once a week  . Attends Religious Services: Never  . Active Member of Clubs or Organizations: No  . Attends Archivist Meetings: Never  . Marital Status: Divorced    Family History  Problem Relation Age of Onset  . Asthma Mother   . Breast cancer Mother   . Allergic rhinitis Mother   . Heart disease Mother   . Colon cancer Father 53       died at 30  . Colon polyps Sister   . Esophageal cancer Neg Hx   . Stomach cancer Neg Hx   . Rectal cancer Neg Hx   . Pancreatic cancer Neg Hx   . Liver disease Neg Hx     Review of Systems  Constitutional: Negative for fever.  HENT: Positive for ear pain (occ with laying on it, radiates down neck). Negative for congestion, hearing loss and sinus pain.   Neurological: Negative for dizziness and headaches.       Objective:   Vitals:   12/08/20 1536  BP: 136/80  Pulse: 84  Temp: 98.3 F (36.8 C)  SpO2: 96%   BP Readings from Last 3 Encounters:  12/08/20 136/80  11/23/20 120/82  08/23/20 (!) 100/53   Wt Readings from Last 3 Encounters:   12/08/20 204 lb (92.5 kg)  11/23/20 205 lb 9.6 oz (93.3 kg)  08/23/20 214 lb (97.1 kg)   Body mass index is 31.95 kg/m.   Physical Exam    GENERAL APPEARANCE: Appears stated age, well appearing, NAD EYES: conjunctiva clear, no icterus HEENT: bilateral tympanic membranes and ear canals normal, oropharynx with no erythema, no thyromegaly, trachea midline, no cervical or supraclavicular lymphadenopathy SKIN: Warm, dry      Assessment & Plan:    See Problem List for Assessment and  Plan of chronic medical problems.    This visit occurred during the SARS-CoV-2 public health emergency.  Safety protocols were in place, including screening questions prior to the visit, additional usage of staff PPE, and extensive cleaning of exam room while observing appropriate contact time as indicated for disinfecting solutions.

## 2020-12-08 ENCOUNTER — Encounter: Payer: Self-pay | Admitting: Internal Medicine

## 2020-12-08 ENCOUNTER — Other Ambulatory Visit: Payer: Self-pay

## 2020-12-08 ENCOUNTER — Ambulatory Visit (INDEPENDENT_AMBULATORY_CARE_PROVIDER_SITE_OTHER): Payer: Medicare Other | Admitting: Internal Medicine

## 2020-12-08 DIAGNOSIS — H6982 Other specified disorders of Eustachian tube, left ear: Secondary | ICD-10-CM | POA: Diagnosis not present

## 2020-12-08 DIAGNOSIS — H6992 Unspecified Eustachian tube disorder, left ear: Secondary | ICD-10-CM | POA: Insufficient documentation

## 2020-12-08 NOTE — Assessment & Plan Note (Addendum)
Acute Started with covid about 2 months ago Occasional pain in ear and radiating down neck, feeling of fluid No improvement with Flonase or Sudafed Unable to take steroids Discussed eustachian tube massage, using heat, laying on her right side We will refer to ENT

## 2020-12-08 NOTE — Patient Instructions (Signed)
A referral was ordered for ENT - [facility]       Someone from their office will call you to schedule an appointment.     Eustachian Tube Dysfunction  Eustachian tube dysfunction refers to a condition in which a blockage develops in the narrow passage that connects the middle ear to the back of the nose (eustachian tube). The eustachian tube regulates air pressure in the middle ear by letting air move between the ear and nose. It also helps to drain fluid from the middle ear space. Eustachian tube dysfunction can affect one or both ears. When the eustachian tube does not function properly, air pressure, fluid, or both can build up in the middle ear. What are the causes? This condition occurs when the eustachian tube becomes blocked or cannot open normally. Common causes of this condition include:  Ear infections.  Colds and other infections that affect the nose, mouth, and throat (upper respiratory tract).  Allergies.  Irritation from cigarette smoke.  Irritation from stomach acid coming up into the esophagus (gastroesophageal reflux). The esophagus is the tube that carries food from the mouth to the stomach.  Sudden changes in air pressure, such as from descending in an airplane or scuba diving.  Abnormal growths in the nose or throat, such as: ? Growths that line the nose (nasal polyps). ? Abnormal growth of cells (tumors). ? Enlarged tissue at the back of the throat (adenoids). What increases the risk? You are more likely to develop this condition if:  You smoke.  You are overweight.  You are a child who has: ? Certain birth defects of the mouth, such as cleft palate. ? Large tonsils or adenoids. What are the signs or symptoms? Common symptoms of this condition include:  A feeling of fullness in the ear.  Ear pain.  Clicking or popping noises in the ear.  Ringing in the ear.  Hearing loss.  Loss of balance.  Dizziness. Symptoms may get worse when the  air pressure around you changes, such as when you travel to an area of high elevation, fly on an airplane, or go scuba diving. How is this diagnosed? This condition may be diagnosed based on:  Your symptoms.  A physical exam of your ears, nose, and throat.  Tests, such as those that measure: ? The movement of your eardrum (tympanogram). ? Your hearing (audiometry). How is this treated? Treatment depends on the cause and severity of your condition.  In mild cases, you may relieve your symptoms by moving air into your ears. This is called "popping the ears."  In more severe cases, or if you have symptoms of fluid in your ears, treatment may include: ? Medicines to relieve congestion (decongestants). ? Medicines that treat allergies (antihistamines). ? Nasal sprays or ear drops that contain medicines that reduce swelling (steroids). ? A procedure to drain the fluid in your eardrum (myringotomy). In this procedure, a small tube is placed in the eardrum to:  Drain the fluid.  Restore the air in the middle ear space. ? A procedure to insert a balloon device through the nose to inflate the opening of the eustachian tube (balloon dilation). Follow these instructions at home: Lifestyle  Do not do any of the following until your health care provider approves: ? Travel to high altitudes. ? Fly in airplanes. ? Work in a Pension scheme manager or room. ? Scuba dive.  Do not use any products that contain nicotine or tobacco, such as cigarettes and e-cigarettes.  If you need help quitting, ask your health care provider.  Keep your ears dry. Wear fitted earplugs during showering and bathing. Dry your ears completely after. General instructions  Take over-the-counter and prescription medicines only as told by your health care provider.  Use techniques to help pop your ears as recommended by your health care provider. These may include: ? Chewing gum. ? Yawning. ? Frequent, forceful  swallowing. ? Closing your mouth, holding your nose closed, and gently blowing as if you are trying to blow air out of your nose.  Keep all follow-up visits as told by your health care provider. This is important. Contact a health care provider if:  Your symptoms do not go away after treatment.  Your symptoms come back after treatment.  You are unable to pop your ears.  You have: ? A fever. ? Pain in your ear. ? Pain in your head or neck. ? Fluid draining from your ear.  Your hearing suddenly changes.  You become very dizzy.  You lose your balance. Summary  Eustachian tube dysfunction refers to a condition in which a blockage develops in the eustachian tube.  It can be caused by ear infections, allergies, inhaled irritants, or abnormal growths in the nose or throat.  Symptoms include ear pain, hearing loss, or ringing in the ears.  Mild cases are treated with maneuvers to unblock the ears, such as yawning or ear popping.  Severe cases are treated with medicines. Surgery may also be done (rare). This information is not intended to replace advice given to you by your health care provider. Make sure you discuss any questions you have with your health care provider. Document Revised: 01/08/2018 Document Reviewed: 01/08/2018 Elsevier Patient Education  [place].

## 2020-12-09 ENCOUNTER — Other Ambulatory Visit: Payer: Self-pay | Admitting: Internal Medicine

## 2020-12-27 NOTE — Progress Notes (Signed)
Subjective:    Patient ID: Patricia Greene, female    DOB: December 30, 1955, 65 y.o.   MRN: [id number]  HPI The patient is here for an acute visit.  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  She does need to be screened for tuberculosis, but is not able to take a PPD so she will need a chest x-ray.  Overall stress level is high, but she feels like she is dealing with it well.  Her mom just got out of the hospital and there are a lot of things going on with the house with repairs, etc.   Medications and allergies reviewed with patient and updated if appropriate.  Patient Active Problem List   Diagnosis Date Noted  . ETD (Eustachian tube dysfunction), left 12/08/2020  . Hyperglycemia 08/04/2020  . Obesity 03/17/2019  . 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 Unilateral primary osteoarthritis, left knee 02/21/2018  . Chronic pain of left knee 01/23/2018  . Helicobacter pylori gastritis 10/02/2017  . Hx of adenomatous polyp of colon 09/27/2017  . Closed wedge compression fracture of T11 vertebra ([place]) 03/15/2017  . Reactive airway disease with wheezing 03/14/2017  . Back muscle spasm 10/04/2016  . H/O breast implant 04/15/2016  . Osteopenia 02/01/2015  . Allergic rhinitis, cause unspecified 03/11/2011  . Vitamin D deficiency 03/13/2010  . Migraine headache 03/13/2010  . CAROTID BRUIT 03/13/2010  . Hypothyroidism 03/09/2010  . INSOMNIA, CHRONIC 02/20/2008  . Anxiety 09/06/2007  . FIBROMYALGIA 09/06/2007  . IRRITABLE BOWEL SYNDROME, HX OF 09/06/2007    Current Outpatient Medications on File Prior to Visit  Medication Sig Dispense Refill  . baclofen (LIORESAL) 20 MG tablet Take 1 tablet (20 mg total) by mouth at bedtime as needed. 30 tablet 5  . celecoxib (CELEBREX) 200 MG capsule     . LORazepam (ATIVAN) 1 MG tablet Take 1 tablet by mouth twice daily as needed for anxiety 60 tablet 0  . methocarbamol  (ROBAXIN) 500 MG tablet Take 1 tablet (500 mg total) by mouth every 6 (six) hours as needed for muscle spasms. 90 tablet 1  . thyroid (ARMOUR) 120 MG tablet Take 120 mg by mouth daily.     Marland Kitchen Hyoscyamine Sulfate SL 0.125 MG SUBL 1-2 every 4 hours as needed for abdominal cramps (Patient not taking: No sig reported) 60 tablet 1   No current facility-administered medications on file prior to visit.    Past Medical History:  Diagnosis Date  . Allergic rhinitis   . Allergy   . Anxiety state, unspecified   . Arthritis    left thumb  . Carotid bruit   . Cervicalgia   . Diverticulosis   . Fibromyalgia    better since breast implants were removed  . GERD (gastroesophageal reflux disease)   . Helicobacter pylori gastritis 10/02/2017   EGD and bx 12/.2018 Quad tx   . History of migraine headaches   . Hypothyroidism    nodular goiter  . IBS (irritable bowel syndrome)   . Insomnia   . Obesity   . Unspecified vitamin D deficiency     Past Surgical History:  Procedure Laterality Date  . BREAST ENHANCEMENT SURGERY    . breast implants removed    . CHOLECYSTECTOMY N/A 06/19/2017   Procedure: LAPAROSCOPIC CHOLECYSTECTOMY WITH INTRAOPERATIVE CHOLANGIOGRAM,REPAIR OF DUODENAL INJURY;  Surgeon: Kieth Brightly, Arta Bruce, MD;  Location: [facility];  Service: General;  Laterality: N/A;  .  COLONOSCOPY  1999, 2004 Sammuel Cooper), 08/09/2011   1999, 2004: diverticulosis  . COLONOSCOPY  08/23/2020   Carlean Purl  . finger reattachment    . THYROIDECTOMY  1973   right, toxic nodule  . TONSILLECTOMY      Social History   Socioeconomic History  . Marital status: Divorced    Spouse name: Not on file  . Number of children: 0  . Years of education: Not on file  . Highest education level: Not on file  Occupational History  . Occupation: Therapist, sports  Tobacco Use  . Smoking status: Never Smoker  . Smokeless tobacco: Never Used  Vaping Use  . Vaping Use: Never used  Substance and Sexual Activity  . Alcohol use: No  .  Drug use: No  . Sexual activity: Not on file  Other Topics Concern  . Not on file  Social History Narrative   2-3 caffeine drinks daily    Divorced, no children. 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 Difficulty of Paying Living Expenses: Not hard at all  Food Insecurity: No Food Insecurity  . Worried About Charity fundraiser in the Last Year: Never true  . Ran Out of Food in the Last Year: Never true  Transportation Needs: No Transportation Needs  . Lack of Transportation (Medical): No  . Lack of Transportation (Non-Medical): No  Physical Activity: Inactive  . Days of Exercise per Week: 0 days  . Minutes of Exercise per Session: 0 min  Stress: No Stress Concern Present  . Feeling of Stress : Not at all  Social Connections: Socially Isolated  . Frequency of Communication with Friends and Family: More than three times a week  . Frequency of Social Gatherings with Friends and Family: Once a week  . Attends Religious Services: Never  . Active Member of Clubs or Organizations: No  . Attends Archivist Meetings: Never  . Marital Status: Divorced    Family History  Problem Relation Age of Onset  . Asthma Mother   . Breast cancer Mother   . Allergic rhinitis Mother   . Heart disease Mother   . Colon cancer Father 50       died at 62  . Colon polyps Sister   . Esophageal cancer Neg Hx   . Stomach cancer Neg Hx   . Rectal cancer Neg Hx   . Pancreatic cancer Neg Hx   . Liver disease Neg Hx     Review of Systems  Constitutional: Negative for chills, diaphoresis and fever.  Respiratory: Negative for cough, shortness of breath and wheezing.   Cardiovascular: Positive for leg swelling (occ, mild). Negative for chest pain and palpitations.  Skin:       Cut on finger  Neurological: Negative for light-headedness and headaches.  Psychiatric/Behavioral:  The patient is nervous/anxious (Intermittent, controlled).        Objective:   Vitals:   12/28/20 1010  BP: 120/70  Pulse: 76  Temp: 98 F (36.7 C)  SpO2: 97%   BP Readings from Last 3 Encounters:  12/28/20 120/70  12/08/20 136/80  11/23/20 120/82   Wt Readings from Last 3 Encounters:  12/28/20 206 lb (93.4 kg)  12/08/20 204 lb (92.5 kg)  11/23/20 205 lb 9.6 oz (93.3 kg)   Body mass index is 32.26 kg/m.   Physical Exam    Constitutional: Appears well-developed  and well-nourished. No distress.  Head: Normocephalic and atraumatic.  Neck: Neck supple. No tracheal deviation present. No thyromegaly present.  No cervical lymphadenopathy Cardiovascular: Normal rate, regular rhythm and normal heart sounds.  No murmur heard. No carotid bruit .  No edema Pulmonary/Chest: Effort normal and breath sounds normal. No respiratory distress. No has no wheezes. No rales.  Skin: Skin is warm and dry. Not diaphoretic.  Small cut on finger without obvious infection-right middle finger posterior service near fingernail Psychiatric: Normal mood and affect. Behavior is normal.      Assessment & Plan:    See Problem List for Assessment and Plan of chronic medical problems.    This visit occurred during the SARS-CoV-2 public health emergency.  Safety protocols were in place, including screening questions prior to the visit, additional usage of staff PPE, and extensive cleaning of exam room while observing appropriate contact time as indicated for disinfecting solutions.

## 2020-12-28 ENCOUNTER — Other Ambulatory Visit: Payer: Self-pay

## 2020-12-28 ENCOUNTER — Ambulatory Visit (INDEPENDENT_AMBULATORY_CARE_PROVIDER_SITE_OTHER): Payer: Medicare Other | Admitting: Internal Medicine

## 2020-12-28 ENCOUNTER — Ambulatory Visit (INDEPENDENT_AMBULATORY_CARE_PROVIDER_SITE_OTHER): Payer: Medicare Other

## 2020-12-28 ENCOUNTER — Encounter: Payer: Self-pay | Admitting: Internal Medicine

## 2020-12-28 VITALS — BP 120/70 | HR 76 | Temp 98.0°F | Ht 67.0 in | Wt 206.0 lb

## 2020-12-28 DIAGNOSIS — Z111 Encounter for screening for respiratory tuberculosis: Secondary | ICD-10-CM | POA: Diagnosis not present

## 2020-12-28 DIAGNOSIS — R7611 Nonspecific reaction to tuberculin skin test without active tuberculosis: Secondary | ICD-10-CM | POA: Diagnosis not present

## 2020-12-28 DIAGNOSIS — S61411A Laceration without foreign body of right hand, initial encounter: Secondary | ICD-10-CM

## 2020-12-28 DIAGNOSIS — Z23 Encounter for immunization: Secondary | ICD-10-CM | POA: Diagnosis not present

## 2020-12-28 DIAGNOSIS — F419 Anxiety disorder, unspecified: Secondary | ICD-10-CM

## 2020-12-28 DIAGNOSIS — S61419A Laceration without foreign body of unspecified hand, initial encounter: Secondary | ICD-10-CM | POA: Insufficient documentation

## 2020-12-28 NOTE — Patient Instructions (Addendum)
You received a tetanus vaccine today.     Go downstairs and have a chest xray.

## 2020-12-28 NOTE — Assessment & Plan Note (Signed)
Chronic Intermittent, controlled She takes Ativan 0.5 g daily as needed-does not take this often Medication above is effective and she denies side effects so we will continue it

## 2020-12-28 NOTE — Assessment & Plan Note (Signed)
Acute Mild, without infection Her tetanus is not up-to-date and we will take this opportunity to get her up-to-date No antibiotics or other treatment needed She will monitor the area and call with concerns

## 2021-01-03 DIAGNOSIS — K582 Mixed irritable bowel syndrome: Secondary | ICD-10-CM | POA: Diagnosis not present

## 2021-01-03 DIAGNOSIS — M542 Cervicalgia: Secondary | ICD-10-CM | POA: Diagnosis not present

## 2021-01-03 DIAGNOSIS — M8589 Other specified disorders of bone density and structure, multiple sites: Secondary | ICD-10-CM | POA: Diagnosis not present

## 2021-01-03 DIAGNOSIS — Z780 Asymptomatic menopausal state: Secondary | ICD-10-CM | POA: Diagnosis not present

## 2021-01-03 DIAGNOSIS — G4700 Insomnia, unspecified: Secondary | ICD-10-CM | POA: Diagnosis not present

## 2021-01-03 DIAGNOSIS — E559 Vitamin D deficiency, unspecified: Secondary | ICD-10-CM | POA: Diagnosis not present

## 2021-01-03 DIAGNOSIS — M898X1 Other specified disorders of bone, shoulder: Secondary | ICD-10-CM | POA: Diagnosis not present

## 2021-01-03 DIAGNOSIS — E039 Hypothyroidism, unspecified: Secondary | ICD-10-CM | POA: Diagnosis not present

## 2021-01-05 DIAGNOSIS — H353122 Nonexudative age-related macular degeneration, left eye, intermediate dry stage: Secondary | ICD-10-CM | POA: Diagnosis not present

## 2021-01-05 DIAGNOSIS — H353211 Exudative age-related macular degeneration, right eye, with active choroidal neovascularization: Secondary | ICD-10-CM | POA: Diagnosis not present

## 2021-01-06 NOTE — Addendum Note (Signed)
Addended by: Marcina Millard on: 01/06/2021 02:13 PM   Modules accepted: Orders

## 2021-01-19 DIAGNOSIS — S92134D Nondisplaced fracture of posterior process of right talus, subsequent encounter for fracture with routine healing: Secondary | ICD-10-CM | POA: Diagnosis not present

## 2021-01-31 DIAGNOSIS — W19XXXS Unspecified fall, sequela: Secondary | ICD-10-CM | POA: Diagnosis not present

## 2021-01-31 DIAGNOSIS — E039 Hypothyroidism, unspecified: Secondary | ICD-10-CM | POA: Diagnosis not present

## 2021-01-31 DIAGNOSIS — E559 Vitamin D deficiency, unspecified: Secondary | ICD-10-CM | POA: Diagnosis not present

## 2021-01-31 DIAGNOSIS — E538 Deficiency of other specified B group vitamins: Secondary | ICD-10-CM | POA: Diagnosis not present

## 2021-02-17 DIAGNOSIS — H00015 Hordeolum externum left lower eyelid: Secondary | ICD-10-CM | POA: Diagnosis not present

## 2021-02-18 ENCOUNTER — Other Ambulatory Visit: Payer: Self-pay | Admitting: Internal Medicine

## 2021-02-25 DIAGNOSIS — H43392 Other vitreous opacities, left eye: Secondary | ICD-10-CM | POA: Diagnosis not present

## 2021-02-25 DIAGNOSIS — H353122 Nonexudative age-related macular degeneration, left eye, intermediate dry stage: Secondary | ICD-10-CM | POA: Diagnosis not present

## 2021-02-25 DIAGNOSIS — H353211 Exudative age-related macular degeneration, right eye, with active choroidal neovascularization: Secondary | ICD-10-CM | POA: Diagnosis not present

## 2021-02-25 DIAGNOSIS — H43813 Vitreous degeneration, bilateral: Secondary | ICD-10-CM | POA: Diagnosis not present

## 2021-03-02 DIAGNOSIS — S92134D Nondisplaced fracture of posterior process of right talus, subsequent encounter for fracture with routine healing: Secondary | ICD-10-CM | POA: Diagnosis not present

## 2021-03-14 ENCOUNTER — Ambulatory Visit: Payer: Medicare Other | Admitting: Internal Medicine

## 2021-03-15 ENCOUNTER — Telehealth: Payer: Self-pay | Admitting: Internal Medicine

## 2021-03-15 NOTE — Chronic Care Management (AMB) (Signed)
  Chronic Care Management   Outreach Note  03/15/2021 Name: [name] MRN: [id number] DOB: Oct 02, 1956  Referred by: Binnie Rail, MD Reason for referral : No chief complaint on file.   An unsuccessful telephone outreach was attempted today. The patient was referred to the pharmacist for assistance with care management and care coordination.   Follow Up Plan:   Lauretta Grill Upstream Scheduler

## 2021-03-16 ENCOUNTER — Telehealth: Payer: Self-pay | Admitting: Internal Medicine

## 2021-03-16 ENCOUNTER — Ambulatory Visit: Payer: Medicare Other | Admitting: Internal Medicine

## 2021-03-16 NOTE — Chronic Care Management (AMB) (Signed)
  Chronic Care Management   Note  03/16/2021 Name: MANASVI DICKARD MRN: [id number] DOB: [birth year]  MIYUKI RZASA is a 65 y.o. year old female who is a primary care patient of Burns, Claudina Lick, MD. I reached out to Liliane Shi by phone today in response to a referral sent by Ms. Bebe Shaggy [place]'s PCP, Binnie Rail, MD.   Ms. Kaminsky was given information about Chronic Care Management services today including:  CCM service includes personalized support from designated clinical staff supervised by her physician, including individualized plan of care and coordination with other care providers 24/7 contact phone numbers for assistance for urgent and routine care needs. Service will only be billed when office clinical staff spend 20 minutes or more in a month to coordinate care. Only one practitioner may furnish and bill the service in a calendar month. The patient may stop CCM services at any time (effective at the end of the month) by phone call to the office staff.   Patient did not agree to enrollment in care management services and does not wish to consider at this time.  Follow up plan:   Lauretta Grill Upstream Scheduler

## 2021-03-21 NOTE — Progress Notes (Signed)
Subjective:    Patient ID: Patricia Greene, female    DOB: 1956/03/30, 65 y.o.   MRN: [id number]  HPI The patient is here for an acute visit to discuss CT results.     She went to ED after falling at school and the scooter handle went into left lower ribs.  She had a Ct scan and there were abnormal findings - he wanted to f/u on those.    CT scan 01/28/21 at [facility] - reviewed report in care everywhere.    Right axilla LN 10 x 12 mm - at times she has felt this.  It is a little tender now.  No other LN or lumps.    RML focal ground glass  - was asymptomatic at the time.   Covid in Jan - no lung symptoms    Reviewed last mammogram 09/28/2020 - she is up to date with her imaging. It was normal   Medications and allergies reviewed with patient and updated if appropriate.  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   Unilateral primary osteoarthritis, left knee 02/21/2018   Chronic pain of left knee 65/12/5463   Helicobacter pylori gastritis 10/02/2017   Hx of adenomatous polyp of colon 09/27/2017   Closed wedge compression fracture of T11 vertebra ([place]) 03/15/2017   Reactive airway disease with wheezing 03/14/2017   Back muscle spasm 10/04/2016   H/O breast implant 04/15/2016   Osteopenia 02/01/2015   Allergic rhinitis, cause unspecified 03/11/2011   Vitamin D deficiency 03/13/2010   Migraine headache 03/13/2010   CAROTID BRUIT 03/13/2010   Hypothyroidism 03/09/2010   INSOMNIA, CHRONIC 02/20/2008   Anxiety 09/06/2007   FIBROMYALGIA 09/06/2007   IRRITABLE BOWEL SYNDROME, HX OF 09/06/2007    Current Outpatient Medications on File Prior to Visit  Medication Sig Dispense Refill   baclofen  (LIORESAL) 20 MG tablet Take 1 tablet (20 mg total) by mouth at bedtime as needed. 30 tablet 5   celecoxib (CELEBREX) 200 MG capsule      Hyoscyamine Sulfate SL 0.125 MG SUBL 1-2 every 4 hours as needed for abdominal cramps 60 tablet 1   LORazepam (ATIVAN) 1 MG tablet Take 1 tablet by mouth twice daily as needed for anxiety 60 tablet 0   methocarbamol (ROBAXIN) 500 MG tablet Take 1 tablet (500 mg total) by mouth every 6 (six) hours as needed for muscle spasms. 90 tablet 1   thyroid (ARMOUR) 120 MG tablet Take 120 mg by mouth daily.      No current facility-administered medications on file prior to visit.    Past Medical History:  Diagnosis Date   Allergic rhinitis    Allergy    Anxiety state, unspecified    Arthritis    left thumb   Carotid bruit    Cervicalgia    Diverticulosis    Fibromyalgia    better since breast implants were removed   GERD (gastroesophageal reflux disease)    Helicobacter pylori gastritis 10/02/2017   EGD and bx 12/.2018 Quad tx    History of migraine headaches    Hypothyroidism    nodular goiter   IBS (irritable bowel syndrome)    Insomnia    Obesity    Unspecified vitamin D  deficiency     Past Surgical History:  Procedure Laterality Date   BREAST ENHANCEMENT SURGERY     breast implants removed     CHOLECYSTECTOMY N/A 06/19/2017   Procedure: LAPAROSCOPIC CHOLECYSTECTOMY WITH INTRAOPERATIVE CHOLANGIOGRAM,REPAIR OF DUODENAL INJURY;  Surgeon: Kieth Brightly, Arta Bruce, MD;  Location: [facility];  Service: General;  Laterality: N/A;   COLONOSCOPY  1999, 2004 Sammuel Cooper), 08/09/2011   1999, 2004: diverticulosis   COLONOSCOPY  08/23/2020   Gessner   finger reattachment     THYROIDECTOMY  1973   right, toxic nodule   TONSILLECTOMY      Social History   Socioeconomic History   Marital status: Divorced    Spouse name: Not on file   Number of children: 0   Years of education: Not on file   Highest education level: Not on file  Occupational History    Occupation: RN  Tobacco Use   Smoking status: Never   Smokeless tobacco: Never  Vaping Use   Vaping Use: Never used  Substance and Sexual Activity   Alcohol use: No   Drug use: No   Sexual activity: Not on file  Other Topics Concern   Not on file  Social History Narrative   2-3 caffeine drinks daily    Divorced, no children. 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, now at school system   Social Determinants of Health   Financial Resource Strain: Low Risk    Difficulty of Paying Living Expenses: Not hard at all  Food Insecurity: No Food Insecurity   Worried About Charity fundraiser in the Last Year: Never true   [place] in the Last Year: Never true  Transportation Needs: No Transportation Needs   Lack of Transportation (Medical): No   Lack of Transportation (Non-Medical): No  Physical Activity: Inactive   Days of Exercise per Week: 0 days   Minutes of Exercise per Session: 0 min  Stress: No Stress Concern Present   Feeling of Stress : Not at all  Social Connections: Socially Isolated   Frequency of Communication with Friends and Family: More than three times a week   Frequency of Social Gatherings with Friends and Family: Once a week   Attends Religious Services: Never   Marine scientist or Organizations: No   Attends Music therapist: Never   Marital Status: Divorced    Family History  Problem Relation Age of Onset   Asthma Mother    Breast cancer Mother    Allergic rhinitis Mother    Heart disease Mother    Colon cancer Father 10       died at 49   Colon polyps Sister    Esophageal cancer Neg Hx    Stomach cancer Neg Hx    Rectal cancer Neg Hx    Pancreatic cancer Neg Hx    Liver disease Neg Hx     Review of Systems  Constitutional:  Negative for chills and fever.  Respiratory:  Negative for cough, shortness of breath and wheezing.   Cardiovascular:  Negative for chest pain.  Neurological:  Negative for  light-headedness and headaches.      Objective:   Vitals:   03/22/21 1537  BP: 118/78  Pulse: 69  Temp: 98 F (36.7 C)  SpO2: 98%   BP Readings from Last 3 Encounters:  03/22/21 118/78  12/28/20 120/70  12/08/20 136/80   Wt Readings from Last 3 Encounters:  03/22/21 211 lb (95.7 kg)  12/28/20 206 lb (93.4 kg)  12/08/20 204 lb (92.5 kg)   Body mass index is 33.05 kg/m.   Physical Exam Constitutional:      General: She is not in acute distress.    Appearance: Normal appearance. She is not ill-appearing.  HENT:     Head: Normocephalic and atraumatic.  Cardiovascular:     Rate and Rhythm: Normal rate and regular rhythm.     Heart sounds: No murmur heard. Pulmonary:     Effort: Pulmonary effort is normal. No respiratory distress.     Breath sounds: No wheezing or rales.  Chest:  Breasts:    Right: Axillary adenopathy (peanut M& M sized tender LN.  no other palpable LNs) present. No supraclavicular adenopathy.     Left: No axillary adenopathy or supraclavicular adenopathy.  Lymphadenopathy:     Upper Body:     Right upper body: Axillary adenopathy (peanut M& M sized tender LN.  no other palpable LNs) present. No supraclavicular adenopathy.     Left upper body: No supraclavicular or axillary adenopathy.  Skin:    General: Skin is warm and dry.  Neurological:     Mental Status: She is alert.           Assessment & Plan:    See Problem List for Assessment and Plan of chronic medical problems.    This visit occurred during the SARS-CoV-2 public health emergency.  Safety protocols were in place, including screening questions prior to the visit, additional usage of staff PPE, and extensive cleaning of exam room while observing appropriate contact time as indicated for disinfecting solutions.

## 2021-03-22 ENCOUNTER — Other Ambulatory Visit: Payer: Self-pay

## 2021-03-22 ENCOUNTER — Encounter: Payer: Self-pay | Admitting: Internal Medicine

## 2021-03-22 ENCOUNTER — Ambulatory Visit (INDEPENDENT_AMBULATORY_CARE_PROVIDER_SITE_OTHER): Payer: Medicare Other | Admitting: Internal Medicine

## 2021-03-22 VITALS — BP 118/78 | HR 69 | Temp 98.0°F | Ht 67.0 in | Wt 211.0 lb

## 2021-03-22 DIAGNOSIS — R918 Other nonspecific abnormal finding of lung field: Secondary | ICD-10-CM | POA: Diagnosis not present

## 2021-03-22 DIAGNOSIS — R590 Localized enlarged lymph nodes: Secondary | ICD-10-CM | POA: Diagnosis not present

## 2021-03-22 NOTE — Patient Instructions (Signed)
   A Ct scan of your lungs and an [place] of your axilla was ordered.

## 2021-03-22 NOTE — Assessment & Plan Note (Signed)
New Seen on Ct scan at [facility] in 4/22 Asymptomatic at the time.  Did have covid in Jan '22 but no lung symptoms  Will order Ct to recheck

## 2021-03-22 NOTE — Assessment & Plan Note (Signed)
New  R axilla LN 10 mm x 12 mm on Ct scan at [place] from 12/2020 Palpable on exam  No other LNs in R or L axilla Will get [place] of axilla - may need bx

## 2021-03-31 ENCOUNTER — Telehealth: Payer: Self-pay | Admitting: Internal Medicine

## 2021-03-31 NOTE — Telephone Encounter (Signed)
  Patient called and is requesting her CT and [place] be ordered at another imaging facility. She said that they cannot get her in on the same day until 7/21. She said that she will be out of town then. Please advise

## 2021-04-05 ENCOUNTER — Encounter: Payer: Self-pay | Admitting: Internal Medicine

## 2021-04-05 NOTE — Telephone Encounter (Signed)
Patricia Greene - what options do we have that can do both?

## 2021-04-05 NOTE — Telephone Encounter (Signed)
Follow up message  Patient calling to request order for CT and [place] been done somewhere other than [facility]. Patient upset because test cannot be done on the same day until August

## 2021-04-05 NOTE — Telephone Encounter (Signed)
Call her - let her know I can order them for [facility] but I am not sure what their availability will be

## 2021-04-06 NOTE — Addendum Note (Signed)
Addended by: Binnie Rail on: 04/06/2021 12:54 PM   Modules accepted: Orders

## 2021-04-06 NOTE — Telephone Encounter (Signed)
I changed the order for the CT scan and ultrasound from [place].  Patricia Greene -not sure if we need to do anything about this or if it goes directly there.

## 2021-04-08 ENCOUNTER — Other Ambulatory Visit: Payer: Medicare Other

## 2021-04-13 ENCOUNTER — Other Ambulatory Visit: Payer: Self-pay

## 2021-04-13 ENCOUNTER — Ambulatory Visit (HOSPITAL_BASED_OUTPATIENT_CLINIC_OR_DEPARTMENT_OTHER)
Admission: RE | Admit: 2021-04-13 | Discharge: 2021-04-13 | Disposition: A | Discharge status: Home / Self Care | Payer: Medicare Other | Source: Ambulatory Visit | Attending: Internal Medicine | Admitting: Internal Medicine

## 2021-04-13 DIAGNOSIS — R590 Localized enlarged lymph nodes: Secondary | ICD-10-CM

## 2021-04-13 DIAGNOSIS — R918 Other nonspecific abnormal finding of lung field: Secondary | ICD-10-CM

## 2021-04-13 DIAGNOSIS — S92134D Nondisplaced fracture of posterior process of right talus, subsequent encounter for fracture with routine healing: Secondary | ICD-10-CM | POA: Diagnosis not present

## 2021-04-13 DIAGNOSIS — M722 Plantar fascial fibromatosis: Secondary | ICD-10-CM | POA: Diagnosis not present

## 2021-04-15 DIAGNOSIS — H353122 Nonexudative age-related macular degeneration, left eye, intermediate dry stage: Secondary | ICD-10-CM | POA: Diagnosis not present

## 2021-04-15 DIAGNOSIS — H43392 Other vitreous opacities, left eye: Secondary | ICD-10-CM | POA: Diagnosis not present

## 2021-04-15 DIAGNOSIS — H353211 Exudative age-related macular degeneration, right eye, with active choroidal neovascularization: Secondary | ICD-10-CM | POA: Diagnosis not present

## 2021-04-15 DIAGNOSIS — H43813 Vitreous degeneration, bilateral: Secondary | ICD-10-CM | POA: Diagnosis not present

## 2021-04-20 ENCOUNTER — Telehealth: Payer: Self-pay | Admitting: Internal Medicine

## 2021-04-20 NOTE — Telephone Encounter (Signed)
Called and spoke with patient. She actually called in regards to her mother. Documentation is in the mother's chart.  Nothing further needed at this time

## 2021-04-21 ENCOUNTER — Other Ambulatory Visit: Payer: Medicare Other

## 2021-05-09 DIAGNOSIS — E039 Hypothyroidism, unspecified: Secondary | ICD-10-CM | POA: Diagnosis not present

## 2021-05-12 DIAGNOSIS — G4700 Insomnia, unspecified: Secondary | ICD-10-CM | POA: Diagnosis not present

## 2021-05-12 DIAGNOSIS — E039 Hypothyroidism, unspecified: Secondary | ICD-10-CM | POA: Diagnosis not present

## 2021-05-12 DIAGNOSIS — F439 Reaction to severe stress, unspecified: Secondary | ICD-10-CM | POA: Diagnosis not present

## 2021-05-15 ENCOUNTER — Other Ambulatory Visit: Payer: Self-pay | Admitting: Internal Medicine

## 2021-05-20 DIAGNOSIS — M79671 Pain in right foot: Secondary | ICD-10-CM | POA: Diagnosis not present

## 2021-06-14 DIAGNOSIS — H353211 Exudative age-related macular degeneration, right eye, with active choroidal neovascularization: Secondary | ICD-10-CM | POA: Diagnosis not present

## 2021-06-14 DIAGNOSIS — H43813 Vitreous degeneration, bilateral: Secondary | ICD-10-CM | POA: Diagnosis not present

## 2021-06-14 DIAGNOSIS — H43392 Other vitreous opacities, left eye: Secondary | ICD-10-CM | POA: Diagnosis not present

## 2021-06-14 DIAGNOSIS — H353122 Nonexudative age-related macular degeneration, left eye, intermediate dry stage: Secondary | ICD-10-CM | POA: Diagnosis not present

## 2021-07-14 DIAGNOSIS — Z01419 Encounter for gynecological examination (general) (routine) without abnormal findings: Secondary | ICD-10-CM | POA: Diagnosis not present

## 2021-07-14 DIAGNOSIS — Z124 Encounter for screening for malignant neoplasm of cervix: Secondary | ICD-10-CM | POA: Diagnosis not present

## 2021-07-14 DIAGNOSIS — N952 Postmenopausal atrophic vaginitis: Secondary | ICD-10-CM | POA: Diagnosis not present

## 2021-07-21 DIAGNOSIS — M545 Low back pain, unspecified: Secondary | ICD-10-CM | POA: Diagnosis not present

## 2021-07-21 DIAGNOSIS — M791 Myalgia, unspecified site: Secondary | ICD-10-CM | POA: Diagnosis not present

## 2021-07-21 DIAGNOSIS — M5106 Intervertebral disc disorders with myelopathy, lumbar region: Secondary | ICD-10-CM | POA: Diagnosis not present

## 2021-08-02 DIAGNOSIS — H43813 Vitreous degeneration, bilateral: Secondary | ICD-10-CM | POA: Diagnosis not present

## 2021-08-02 DIAGNOSIS — H43392 Other vitreous opacities, left eye: Secondary | ICD-10-CM | POA: Diagnosis not present

## 2021-08-02 DIAGNOSIS — H353122 Nonexudative age-related macular degeneration, left eye, intermediate dry stage: Secondary | ICD-10-CM | POA: Diagnosis not present

## 2021-08-02 DIAGNOSIS — H353211 Exudative age-related macular degeneration, right eye, with active choroidal neovascularization: Secondary | ICD-10-CM | POA: Diagnosis not present

## 2021-08-08 DIAGNOSIS — E039 Hypothyroidism, unspecified: Secondary | ICD-10-CM | POA: Diagnosis not present

## 2021-08-08 DIAGNOSIS — G4700 Insomnia, unspecified: Secondary | ICD-10-CM | POA: Diagnosis not present

## 2021-08-11 DIAGNOSIS — E559 Vitamin D deficiency, unspecified: Secondary | ICD-10-CM | POA: Diagnosis not present

## 2021-08-11 DIAGNOSIS — L989 Disorder of the skin and subcutaneous tissue, unspecified: Secondary | ICD-10-CM | POA: Diagnosis not present

## 2021-08-11 DIAGNOSIS — G4700 Insomnia, unspecified: Secondary | ICD-10-CM | POA: Diagnosis not present

## 2021-08-11 DIAGNOSIS — E039 Hypothyroidism, unspecified: Secondary | ICD-10-CM | POA: Diagnosis not present

## 2021-09-13 ENCOUNTER — Other Ambulatory Visit: Payer: Self-pay | Admitting: Internal Medicine

## 2021-09-21 DIAGNOSIS — H43813 Vitreous degeneration, bilateral: Secondary | ICD-10-CM | POA: Diagnosis not present

## 2021-09-21 DIAGNOSIS — H43392 Other vitreous opacities, left eye: Secondary | ICD-10-CM | POA: Diagnosis not present

## 2021-09-21 DIAGNOSIS — H353122 Nonexudative age-related macular degeneration, left eye, intermediate dry stage: Secondary | ICD-10-CM | POA: Diagnosis not present

## 2021-09-21 DIAGNOSIS — H353211 Exudative age-related macular degeneration, right eye, with active choroidal neovascularization: Secondary | ICD-10-CM | POA: Diagnosis not present

## 2021-10-31 DIAGNOSIS — Z1231 Encounter for screening mammogram for malignant neoplasm of breast: Secondary | ICD-10-CM | POA: Diagnosis not present

## 2021-10-31 LAB — HM MAMMOGRAPHY

## 2021-11-03 ENCOUNTER — Encounter: Payer: Self-pay | Admitting: Internal Medicine

## 2021-11-03 NOTE — Progress Notes (Signed)
Outside notes received. Information abstracted. Notes sent to scan.  

## 2021-11-09 ENCOUNTER — Other Ambulatory Visit: Payer: Self-pay | Admitting: Internal Medicine

## 2021-11-09 DIAGNOSIS — H43813 Vitreous degeneration, bilateral: Secondary | ICD-10-CM | POA: Diagnosis not present

## 2021-11-09 DIAGNOSIS — H353122 Nonexudative age-related macular degeneration, left eye, intermediate dry stage: Secondary | ICD-10-CM | POA: Diagnosis not present

## 2021-11-09 DIAGNOSIS — H43392 Other vitreous opacities, left eye: Secondary | ICD-10-CM | POA: Diagnosis not present

## 2021-11-09 DIAGNOSIS — H353211 Exudative age-related macular degeneration, right eye, with active choroidal neovascularization: Secondary | ICD-10-CM | POA: Diagnosis not present

## 2021-11-13 ENCOUNTER — Encounter: Payer: Self-pay | Admitting: Internal Medicine

## 2021-11-13 NOTE — Progress Notes (Signed)
Subjective:    Patient ID: Patricia Greene, female    DOB: Apr 14, 1956, 66 y.o.   MRN: [id number]  This visit occurred during the SARS-CoV-2 public health emergency.  Safety protocols were in place, including screening questions prior to the visit, additional usage of staff PPE, and extensive cleaning of exam room while observing appropriate contact time as indicated for disinfecting solutions.    HPI The patient is here for an acute visit.  Has a lot of stress at home with her mom who is very sick.   Back pain - the high stress has cause a lot of muscle tightness in her back.  When she goes to get up the muscle tightness in her lower back almost causes her to bend over.  She has a history of muscle spasms and has been taking methocarbamol during the day and baclofen at night, which helps a little, but is more of a Band-Aid.  She has a lot of tightness in her posterior legs.    She has significant anxiety and probably some depression from all of the stress and her mother's deteriorating health.  She is taking the Ativan as needed and it does help.  She has an area on her left upper chest that she thinks is infected.  Medications and allergies reviewed with patient and updated if appropriate.  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   Unilateral primary osteoarthritis, left knee 02/21/2018   Chronic pain of left knee 14/43/1540   Helicobacter pylori gastritis 10/02/2017   Hx of adenomatous polyp of colon 09/27/2017   Closed wedge compression fracture of T11 vertebra ([place]) 03/15/2017   Reactive airway disease with wheezing 03/14/2017   Back muscle spasm 10/04/2016   H/O breast  implant 04/15/2016   Osteopenia 02/01/2015   Allergic rhinitis, cause unspecified 03/11/2011   Vitamin D deficiency 03/13/2010   Migraine headache 03/13/2010   CAROTID BRUIT 03/13/2010   Hypothyroidism 03/09/2010   INSOMNIA, CHRONIC 02/20/2008   Anxiety 09/06/2007   FIBROMYALGIA 09/06/2007   IRRITABLE BOWEL SYNDROME, HX OF 09/06/2007    Current Outpatient Medications on File Prior to Visit  Medication Sig Dispense Refill   baclofen (LIORESAL) 20 MG tablet TAKE 1 TABLET BY MOUTH AT BEDTIME AS NEEDED 30 tablet 0   baclofen (LIORESAL) 20 MG tablet Take by mouth.     celecoxib (CELEBREX) 200 MG capsule      Hyoscyamine Sulfate SL 0.125 MG SUBL 1-2 every 4 hours as needed for abdominal cramps 60 tablet 1   ibuprofen (ADVIL) 800 MG tablet TAKE 1 TABLET BY MOUTH THREE TIMES DAILY WITH FOOD AS NEEDED     LORazepam (ATIVAN) 1 MG tablet Take 1 tablet by mouth twice daily as needed for anxiety 60 tablet 0   meloxicam (MOBIC) 15 MG tablet Take by mouth.     methocarbamol (ROBAXIN) 500 MG tablet TAKE 1 TABLET BY MOUTH EVERY 6 HOURS AS NEEDED FOR MUSCLE SPASM 90 tablet 0   thyroid (ARMOUR) 120 MG tablet Take 120 mg by mouth daily.      tobramycin (TOBREX) 0.3 % ophthalmic solution INSTILL 1 DROP INTO RIGHT EYE 4 TIMES DAILY. BEGIN 1 DAY PRIOR TO PROCEDURE, USE DAY  OF PROCEDURE, AND USE 1 DAY AFTER PROCEDURE THEN STOP     No current facility-administered medications on file prior to visit.    Past Medical History:  Diagnosis Date   Allergic rhinitis    Allergy    Anxiety state, unspecified    Arthritis    left thumb   Carotid bruit    Cervicalgia    Diverticulosis    Fibromyalgia    better since breast implants were removed   GERD (gastroesophageal reflux disease)    Helicobacter pylori gastritis 10/02/2017   EGD and bx 12/.2018 Quad tx    History of migraine headaches    Hypothyroidism    nodular goiter   IBS (irritable bowel syndrome)    Insomnia    Obesity    Unspecified vitamin D  deficiency     Past Surgical History:  Procedure Laterality Date   BREAST ENHANCEMENT SURGERY     breast implants removed     CHOLECYSTECTOMY N/A 06/19/2017   Procedure: LAPAROSCOPIC CHOLECYSTECTOMY WITH INTRAOPERATIVE CHOLANGIOGRAM,REPAIR OF DUODENAL INJURY;  Surgeon: Kieth Brightly, Arta Bruce, MD;  Location: [facility];  Service: General;  Laterality: N/A;   COLONOSCOPY  1999, 2004 Sammuel Cooper), 08/09/2011   1999, 2004: diverticulosis   COLONOSCOPY  08/23/2020   Gessner   finger reattachment     THYROIDECTOMY  1973   right, toxic nodule   TONSILLECTOMY      Social History   Socioeconomic History   Marital status: Divorced    Spouse name: Not on file   Number of children: 0   Years of education: Not on file   Highest education level: Not on file  Occupational History   Occupation: RN  Tobacco Use   Smoking status: Never   Smokeless tobacco: Never  Vaping Use   Vaping Use: Never used  Substance and Sexual Activity   Alcohol use: No   Drug use: No   Sexual activity: Not on file  Other Topics Concern   Not on file  Social History Narrative   2-3 caffeine drinks daily    Divorced, no children. 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, now at school system   Social Determinants of Health   Financial Resource Strain: Low Risk    Difficulty of Paying Living Expenses: Not hard at all  Food Insecurity: No Food Insecurity   Worried About Charity fundraiser in the Last Year: Never true   [place] in the Last Year: Never true  Transportation Needs: No Transportation Needs   Lack of Transportation (Medical): No   Lack of Transportation (Non-Medical): No  Physical Activity: Inactive   Days of Exercise per Week: 0 days   Minutes of Exercise per Session: 0 min  Stress: No Stress Concern Present   Feeling of Stress : Not at all  Social Connections: Socially Isolated   Frequency of Communication with Friends and Family: More than three times a week    Frequency of Social Gatherings with Friends and Family: Once a week   Attends Religious Services: Never   Marine scientist or Organizations: No   Attends Archivist Meetings: Never   Marital Status: Divorced    Family History  Problem Relation Age of Onset   Asthma Mother    Breast cancer Mother    Allergic rhinitis Mother    Heart disease Mother    Colon cancer Father 88       died at 70   Colon polyps  Sister    Esophageal cancer Neg Hx    Stomach cancer Neg Hx    Rectal cancer Neg Hx    Pancreatic cancer Neg Hx    Liver disease Neg Hx     Review of Systems  Constitutional:  Negative for fever.  Musculoskeletal:  Positive for back pain and myalgias.  Neurological:  Negative for weakness and numbness.  Psychiatric/Behavioral:  Positive for dysphoric mood. The patient is nervous/anxious.       Objective:   Vitals:   11/15/21 1309  BP: 124/80  Pulse: 77  Temp: 98.3 F (36.8 C)  SpO2: 97%   BP Readings from Last 3 Encounters:  11/15/21 124/80  03/22/21 118/78  12/28/20 120/70   Wt Readings from Last 3 Encounters:  11/15/21 210 lb (95.3 kg)  03/22/21 211 lb (95.7 kg)  12/28/20 206 lb (93.4 kg)   Body mass index is 32.89 kg/m.   Physical Exam Constitutional:      General: She is not in acute distress.    Appearance: Normal appearance. She is not ill-appearing.  HENT:     Head: Normocephalic and atraumatic.  Musculoskeletal:        General: Tenderness (lower back tender to palpitation) present.  Skin:    General: Skin is warm and dry.     Comments: Areas of erythema and scratch marks left upper chest with mild surrounding erythema, no active discharge or bleeding-not improving per patient with conservative treatment at home  Neurological:     Mental Status: She is alert.     Sensory: No sensory deficit.     Motor: No weakness.  Psychiatric:     Comments: Anxiety, crying intermittent           Assessment & Plan:    See Problem  List for Assessment and Plan of chronic medical problems.

## 2021-11-14 DIAGNOSIS — E039 Hypothyroidism, unspecified: Secondary | ICD-10-CM | POA: Diagnosis not present

## 2021-11-15 ENCOUNTER — Ambulatory Visit (INDEPENDENT_AMBULATORY_CARE_PROVIDER_SITE_OTHER): Payer: Medicare Other | Admitting: Internal Medicine

## 2021-11-15 ENCOUNTER — Other Ambulatory Visit: Payer: Self-pay

## 2021-11-15 VITALS — BP 124/80 | HR 77 | Temp 98.3°F | Ht 67.0 in | Wt 210.0 lb

## 2021-11-15 DIAGNOSIS — Z6832 Body mass index (BMI) 32.0-32.9, adult: Secondary | ICD-10-CM | POA: Diagnosis not present

## 2021-11-15 DIAGNOSIS — E039 Hypothyroidism, unspecified: Secondary | ICD-10-CM | POA: Diagnosis not present

## 2021-11-15 DIAGNOSIS — F3289 Other specified depressive episodes: Secondary | ICD-10-CM | POA: Diagnosis not present

## 2021-11-15 DIAGNOSIS — F419 Anxiety disorder, unspecified: Secondary | ICD-10-CM

## 2021-11-15 DIAGNOSIS — L0390 Cellulitis, unspecified: Secondary | ICD-10-CM | POA: Insufficient documentation

## 2021-11-15 DIAGNOSIS — M62830 Muscle spasm of back: Secondary | ICD-10-CM | POA: Diagnosis not present

## 2021-11-15 DIAGNOSIS — L03313 Cellulitis of chest wall: Secondary | ICD-10-CM

## 2021-11-15 DIAGNOSIS — M545 Low back pain, unspecified: Secondary | ICD-10-CM | POA: Insufficient documentation

## 2021-11-15 DIAGNOSIS — R635 Abnormal weight gain: Secondary | ICD-10-CM | POA: Diagnosis not present

## 2021-11-15 DIAGNOSIS — F32A Depression, unspecified: Secondary | ICD-10-CM | POA: Insufficient documentation

## 2021-11-15 DIAGNOSIS — F439 Reaction to severe stress, unspecified: Secondary | ICD-10-CM | POA: Diagnosis not present

## 2021-11-15 MED ORDER — DOXYCYCLINE HYCLATE 100 MG PO TABS: 100.0000 mg | ORAL_TABLET | Freq: Two times a day (BID) | ORAL | 0 refills | Status: AC

## 2021-11-15 MED ORDER — FLUOXETINE HCL 20 MG PO TABS: 20.0000 mg | ORAL_TABLET | Freq: Every day | ORAL | 1 refills | Status: DC

## 2021-11-15 NOTE — Assessment & Plan Note (Addendum)
Acute Taking baclofen at night  Taking methocarbamol during the day Would like to do PT - referred to integrative PT per her request

## 2021-11-15 NOTE — Assessment & Plan Note (Signed)
Chronic Has been intermittent in the past, but is more generalized now because of increased stress caring for her mother and her deteriorating health Discussed options-would recommend a daily SSRI and she agrees to try it Start fluoxetine 20 mg daily Continue Ativan 1 mg twice daily as needed-hopefully she will be able to start taking less of this

## 2021-11-15 NOTE — Assessment & Plan Note (Signed)
Acute Area in left upper chest that has a mild infection She has applied several topical things including antibacterial ointments without improvement She admits she has been picking at the area likely related to increased stress Start doxycycline 100 mg twice daily for 7-10 days

## 2021-11-15 NOTE — Patient Instructions (Addendum)
° ° ° °  Medications changes include :  fluoxetine 20 mg daily   Your prescription(s) have been submitted to your pharmacy. Please take as directed and contact our office if you believe you are having problem(s) with the medication(s).   A referral was ordered for integrative PT       Someone from their office will call you to schedule an appointment.    Please followup in 6 months

## 2021-11-15 NOTE — Assessment & Plan Note (Signed)
Mild depression She has a lot of anxiety and her mother's health is deteriorating and she is the primary caregiver which is causing a lot of increased stress I think there is an element of depression along with increased anxiety We will be starting fluoxetine 20 mg daily, which will hopefully help

## 2021-11-15 NOTE — Assessment & Plan Note (Addendum)
Chronic Worsening symptoms recently Taking baclofen at night  Taking methocarbamol during the day Would like to do PT - referred to integrative PT per her request

## 2021-12-01 ENCOUNTER — Ambulatory Visit (INDEPENDENT_AMBULATORY_CARE_PROVIDER_SITE_OTHER): Payer: Medicare Other

## 2021-12-01 DIAGNOSIS — Z0000 Encounter for general adult medical examination without abnormal findings: Secondary | ICD-10-CM

## 2021-12-01 NOTE — Progress Notes (Signed)
I connected with Patricia Greene today by telephone and verified that I am speaking with the correct person using two identifiers. Location patient: home Location provider: work Persons participating in the virtual visit: patient, provider.   I discussed the limitations, risks, security and privacy concerns of performing an evaluation and management service by telephone and the availability of in person appointments. I also discussed with the patient that there may be a patient responsible charge related to this service. The patient expressed understanding and verbally consented to this telephonic visit.    Interactive audio and video telecommunications were attempted between this provider and patient, however failed, due to patient having technical difficulties OR patient did not have access to video capability.  We continued and completed visit with audio only.  Some vital signs may be absent or patient reported.   Time Spent with patient on telephone encounter: 40 minutes  Subjective:   Patricia Greene is a 66 y.o. female who presents for Medicare Annual (Subsequent) preventive examination.  Review of Systems     Cardiac Risk Factors include: advanced age (>55men, >33 women);family history of premature cardiovascular disease;obesity (BMI >30kg/m2)     Objective:    There were no vitals filed for this visit. There is no height or weight on file to calculate BMI.  Advanced Directives 12/01/2021 11/23/2020 08/23/2020 09/27/2017 06/19/2017 06/15/2017  Does Patient Have a Medical Advance Directive? No No No No No No  Would patient like information on creating a medical advance directive? No - Patient declined No - Patient declined No - Patient declined - No - Patient declined -    Current Medications (verified) Outpatient Encounter Medications as of 12/01/2021  Medication Sig   baclofen (LIORESAL) 20 MG tablet TAKE 1 TABLET BY MOUTH AT BEDTIME AS NEEDED   FLUoxetine (PROZAC) 20 MG tablet  Take 1 tablet (20 mg total) by mouth daily.   Hyoscyamine Sulfate SL 0.125 MG SUBL 1-2 every 4 hours as needed for abdominal cramps   LORazepam (ATIVAN) 1 MG tablet Take 1 tablet by mouth twice daily as needed for anxiety   meloxicam (MOBIC) 15 MG tablet Take by mouth.   methocarbamol (ROBAXIN) 500 MG tablet TAKE 1 TABLET BY MOUTH EVERY 6 HOURS AS NEEDED FOR MUSCLE SPASM   thyroid (ARMOUR) 120 MG tablet Take 120 mg by mouth daily.    tobramycin (TOBREX) 0.3 % ophthalmic solution INSTILL 1 DROP INTO RIGHT EYE 4 TIMES DAILY. BEGIN 1 DAY PRIOR TO PROCEDURE, USE DAY OF PROCEDURE, AND USE 1 DAY AFTER PROCEDURE THEN STOP   No facility-administered encounter medications on file as of 12/01/2021.    Allergies (verified) Cortisone and Silicone   History: Past Medical History:  Diagnosis Date   Allergic rhinitis    Allergy    Anxiety state, unspecified    Arthritis    left thumb   Carotid bruit    Cervicalgia    Diverticulosis    Fibromyalgia    better since breast implants were removed   GERD (gastroesophageal reflux disease)    Helicobacter pylori gastritis 10/02/2017   EGD and bx 12/.2018 Quad tx    History of migraine headaches    Hypothyroidism    nodular goiter   IBS (irritable bowel syndrome)    Insomnia    Obesity    Unspecified vitamin D deficiency    Past Surgical History:  Procedure Laterality Date   BREAST ENHANCEMENT SURGERY     breast implants removed     CHOLECYSTECTOMY N/A  06/19/2017   Procedure: LAPAROSCOPIC CHOLECYSTECTOMY WITH INTRAOPERATIVE CHOLANGIOGRAM,REPAIR OF DUODENAL INJURY;  Surgeon: Kieth Brightly, Arta Bruce, MD;  Location: [facility];  Service: General;  Laterality: N/A;   COLONOSCOPY  1999, 2004 Sammuel Cooper), 08/09/2011   1999, 2004: diverticulosis   COLONOSCOPY  08/23/2020   Gessner   finger reattachment     THYROIDECTOMY  1973   right, toxic nodule   TONSILLECTOMY     Family History  Problem Relation Age of Onset   Asthma Mother    Breast cancer Mother     Allergic rhinitis Mother    Heart disease Mother    Colon cancer Father 7       died at 49   Colon polyps Sister    Esophageal cancer Neg Hx    Stomach cancer Neg Hx    Rectal cancer Neg Hx    Pancreatic cancer Neg Hx    Liver disease Neg Hx    Social History   Socioeconomic History   Marital status: Divorced    Spouse name: Not on file   Number of children: 0   Years of education: Not on file   Highest education level: Not on file  Occupational History   Occupation: RN  Tobacco Use   Smoking status: Never   Smokeless tobacco: Never  Vaping Use   Vaping Use: Never used  Substance and Sexual Activity   Alcohol use: No   Drug use: No   Sexual activity: Not on file  Other Topics Concern   Not on file  Social History Narrative   2-3 caffeine drinks daily    Divorced, no children. 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, now at school system   Social Determinants of Health   Financial Resource Strain: Low Risk    Difficulty of Paying Living Expenses: Not hard at all  Food Insecurity: No Food Insecurity   Worried About Charity fundraiser in the Last Year: Never true   [place] in the Last Year: Never true  Transportation Needs: No Transportation Needs   Lack of Transportation (Medical): No   Lack of Transportation (Non-Medical): No  Physical Activity: Inactive   Days of Exercise per Week: 0 days   Minutes of Exercise per Session: 0 min  Stress: No Stress Concern Present   Feeling of Stress : Not at all  Social Connections: Socially Isolated   Frequency of Communication with Friends and Family: More than three times a week   Frequency of Social Gatherings with Friends and Family: Once a week   Attends Religious Services: Never   Marine scientist or Organizations: No   Attends Music therapist: Never   Marital Status: Divorced    Tobacco Counseling Counseling given: Not Answered   Clinical Intake:  Pre-visit  preparation completed: Yes  Pain : No/denies pain     Nutritional Risks: None Diabetes: No  How often do you need to have someone help you when you read instructions, pamphlets, or other written materials from your doctor or pharmacy?: 1 - Never 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 no  Interpreter Needed?: No  Information entered by :: Lisette Abu, LPN   Activities of Daily Living In your present state of health, do you have any difficulty performing the following activities: 12/01/2021  Hearing? N  Vision? N  Difficulty concentrating or making decisions? N  Walking or climbing stairs? N  Dressing or bathing?  N  Doing errands, shopping? N  Preparing Food and eating ? N  Using the Toilet? N  In the past six months, have you accidently leaked urine? N  Do you have problems with loss of bowel control? N  Managing your Medications? N  Managing your Finances? N  Housekeeping or managing your Housekeeping? N  Some recent data might be hidden    Patient Care Team: Binnie Rail, MD as PCP - General (Internal Medicine) Gatha Mayer, MD as Consulting Physician (Gastroenterology) Meisinger, Sherren Mocha, MD as Consulting Physician (Obstetrics and Gynecology) Bo Merino, MD as Consulting Physician (Rheumatology) Reynold Bowen, MD as Consulting Physician (Endocrinology) Starling Manns, MD (Orthopedic Surgery) Sherlynn Stalls, MD as Consulting Physician (Ophthalmology)  Indicate any recent Medical Services you may have received from other than [facility] providers in the past year (date may be approximate).     Assessment:   This is a routine wellness examination for Maylin.  Hearing/Vision screen Hearing Screening - Comments:: Patient denied any hearing difficulty.   No hearing aids.  Vision Screening - Comments:: Patient wears corrective glasses/contacts.     Dietary issues and exercise activities discussed: Current Exercise  Habits: The patient does not participate in regular exercise at present, Exercise limited by: respiratory conditions(s);orthopedic condition(s);psychological condition(s)   Goals Addressed             This Visit's Progress    Patient Stated       Continue to be a caregiver for my mom.  Get back in the gym and get physically active again.      Depression Screen PHQ 2/9 Scores 12/01/2021 11/15/2021 11/23/2020 03/19/2019 09/06/2018  PHQ - 2 Score 0 1 0 0 0  PHQ- 9 Score - - - 3 -    Fall Risk Fall Risk  12/01/2021 11/15/2021 11/23/2020 12/24/2019 03/18/2019  Falls in the past year? 1 1 0 0 0  Number falls in past yr: 0 0 0 0 0  Injury with Fall? 0 0 0 0 -  Risk for fall due to : No Fall Risks No Fall Risks No Fall Risks - -  Follow up Falls evaluation completed Falls evaluation completed - - -    FALL RISK PREVENTION PERTAINING TO THE HOME:  Any stairs in or around the home? No  If so, are there any without handrails? No  Home free of loose throw rugs in walkways, pet beds, electrical cords, etc? Yes  Adequate lighting in your home to reduce risk of falls? Yes   ASSISTIVE DEVICES UTILIZED TO PREVENT FALLS:  Life alert? No  Use of a cane, walker or w/c? No  Grab bars in the bathroom? No  Shower chair or bench in shower? No  Elevated toilet seat or a handicapped toilet? No   TIMED UP AND GO:  Was the test performed? No .  Length of time to ambulate 10 feet: n/a sec.   Gait steady and fast without use of assistive device  Cognitive Function: Normal cognitive status assessed by direct observation by this Nurse Health Advisor. No abnormalities found.          Immunizations Immunization History  Administered Date(s) Administered   Influenza,trivalent, recombinat, inj, PF 06/30/2015   Tdap 12/28/2020    TDAP status: Up to date  Flu Vaccine status: Declined, Education has been provided regarding the importance of this vaccine but patient still declined. Advised may receive  this vaccine at local pharmacy or Health Dept. Aware to provide a copy  of the vaccination record if obtained from local pharmacy or Health Dept. Verbalized acceptance and understanding.  Pneumococcal vaccine status: Declined,  Education has been provided regarding the importance of this vaccine but patient still declined. Advised may receive this vaccine at local pharmacy or Health Dept. Aware to provide a copy of the vaccination record if obtained from local pharmacy or Health Dept. Verbalized acceptance and understanding.   Covid-19 vaccine status: Declined, Education has been provided regarding the importance of this vaccine but patient still declined. Advised may receive this vaccine at local pharmacy or Health Dept.or vaccine clinic. Aware to provide a copy of the vaccination record if obtained from local pharmacy or Health Dept. Verbalized acceptance and understanding.  Qualifies for Shingles Vaccine? Yes   Zostavax completed No   Shingrix Completed?: No.    Education has been provided regarding the importance of this vaccine. Patient has been advised to call insurance company to determine out of pocket expense if they have not yet received this vaccine. Advised may also receive vaccine at local pharmacy or Health Dept. Verbalized acceptance and understanding.  Screening Tests Health Maintenance  Topic Date Due   COVID-19 Vaccine (1) Never done   Zoster Vaccines- Shingrix (1 of 2) Never done   Pneumonia Vaccine 28+ Years old (1 - PCV) Never done   INFLUENZA VACCINE  05/02/2021   DEXA SCAN  08/11/2022   MAMMOGRAM  11/01/2023   COLONOSCOPY (Pts 45-41yrs Insurance coverage will need to be confirmed)  08/23/2025   TETANUS/TDAP  12/29/2030   Hepatitis C Screening  Completed   HPV VACCINES  Aged Out    Health Maintenance  Health Maintenance Due  Topic Date Due   COVID-19 Vaccine (1) Never done   Zoster Vaccines- Shingrix (1 of 2) Never done   Pneumonia Vaccine 46+ Years old (1 - PCV)  Never done   INFLUENZA VACCINE  05/02/2021    Colorectal cancer screening: Type of screening: Colonoscopy. Completed 08/23/2020. Repeat every 5 years  Mammogram status: Completed 10/31/2021. Repeat every year  Bone Density status: Completed 08/11/2020. Results reflect: Bone density results: OSTEOPENIA. Repeat every 2 years.  Lung Cancer Screening: (Low Dose CT Chest recommended if Age 85-80 years, 30 pack-year currently smoking OR have quit w/in 15years.) does not qualify.   Lung Cancer Screening Referral: no  Additional Screening:  Hepatitis C Screening: does qualify; Completed yes  Vision Screening: Recommended annual ophthalmology exams for early detection of glaucoma and other disorders of the eye. Is the patient up to date with their annual eye exam?  Yes  Who is the provider or what is the name of the office in which the patient attends annual eye exams? Sherlynn Stalls, MD. If pt is not established with a provider, would they like to be referred to a provider to establish care? No .   Dental Screening: Recommended annual dental exams for proper oral hygiene  Community Resource Referral / Chronic Care Management: CRR required this visit?  No   CCM required this visit?  No      Plan:     I have personally reviewed and noted the following in the patients chart:   Medical and social history Use of alcohol, tobacco or illicit drugs  Current medications and supplements including opioid prescriptions.  Functional ability and status Nutritional status Physical activity Advanced directives List of other physicians Hospitalizations, surgeries, and ER visits in previous 12 months Vitals Screenings to include cognitive, depression, and falls Referrals and appointments  In addition,  I have reviewed and discussed with patient certain preventive protocols, quality metrics, and best practice recommendations. A written personalized care plan for preventive services as well as  general preventive health recommendations were provided to patient.     Sheral Flow, LPN   02/02/6143   Nurse Notes:  Patient is cogitatively intact. There were no vitals filed for this visit. There is no height or weight on file to calculate BMI. Patient stated that she has no issues with gait or balance; does not use any assistive devices.

## 2021-12-01 NOTE — Patient Instructions (Signed)
Patricia Greene , Thank you for taking time to come for your Medicare Wellness Visit. I appreciate your ongoing commitment to your health goals. Please review the following plan we discussed and let me know if I can assist you in the future.   Screening recommendations/referrals: Colonoscopy: 08/23/2020; due every 5 years Mammogram: 10/31/2021; due every 1-2 years Bone Density: 08/11/2020; due every 2 years Recommended yearly ophthalmology/optometry visit for glaucoma screening and checkup Recommended yearly dental visit for hygiene and checkup  Vaccinations: Influenza vaccine: declined Pneumococcal vaccine: declined Tdap vaccine: 12/28/2020; due every 10 years Shingles vaccine: declined   Covid-19: declined  Advanced directives: Advance directive discussed with you today. Even though you declined this today please call our office should you change your mind and we can give you the proper paperwork for you to fill out.  Conditions/risks identified: Yes; Client understands the importance of follow-up appointments with providers by attending scheduled visits and discussed goals to eat healthier, increase physical activity 5 times a week for 30 minutes each, exercise the brain by doing stimulating brain exercises (reading, adult coloring, crafting, listening to music, puzzles, etc.), socialize and enjoy life more, get enough sleep at least 8-9 hours average per night and make time for laughter.  Next appointment: Please schedule your next Medicare Wellness Visit with your Nurse Health Advisor in 1 year by calling [phone].   Preventive Care 14 Years and Older, Female Preventive care refers to lifestyle choices and visits with your health care provider that can promote health and wellness. What does preventive care include? A yearly physical exam. This is also called an annual well check. Dental exams once or twice a year. Routine eye exams. Ask your health care provider how often you should  have your eyes checked. Personal lifestyle choices, including: Daily care of your teeth and gums. Regular physical activity. Eating a healthy diet. Avoiding tobacco and drug use. Limiting alcohol use. Practicing safe sex. Taking low-dose aspirin every day. Taking vitamin and mineral supplements as recommended by your health care provider. What happens during an annual well check? The services and screenings done by your health care provider during your annual well check will depend on your age, overall health, lifestyle risk factors, and family history of disease. Counseling  Your health care provider may ask you questions about your: Alcohol use. Tobacco use. Drug use. Emotional well-being. Home and relationship well-being. Sexual activity. Eating habits. History of falls. Memory and ability to understand (cognition). Work and work Statistician. Reproductive health. Screening  You may have the following tests or measurements: Height, weight, and BMI. Blood pressure. Lipid and cholesterol levels. These may be checked every 5 years, or more frequently if you are over 28 years old. Skin check. Lung cancer screening. You may have this screening every year starting at age 19 if you have a 30-pack-year history of smoking and currently smoke or have quit within the past 15 years. Fecal occult blood test (FOBT) of the stool. You may have this test every year starting at age 37. Flexible sigmoidoscopy or colonoscopy. You may have a sigmoidoscopy every 5 years or a colonoscopy every 10 years starting at age 5. Hepatitis C blood test. Hepatitis B blood test. Sexually transmitted disease (STD) testing. Diabetes screening. This is done by checking your blood sugar (glucose) after you have not eaten for a while (fasting). You may have this done every 1-3 years. Bone density scan. This is done to screen for osteoporosis. You may have this done starting at  age 1. Mammogram. This may be done  every 1-2 years. Talk to your health care provider about how often you should have regular mammograms. Talk with your health care provider about your test results, treatment options, and if necessary, the need for more tests. Vaccines  Your health care provider may recommend certain vaccines, such as: Influenza vaccine. This is recommended every year. Tetanus, diphtheria, and acellular pertussis (Tdap, Td) vaccine. You may need a Td booster every 10 years. Zoster vaccine. You may need this after age 59. Pneumococcal 13-valent conjugate (PCV13) vaccine. One dose is recommended after age 48. Pneumococcal polysaccharide (PPSV23) vaccine. One dose is recommended after age 30. Talk to your health care provider about which screenings and vaccines you need and how often you need them. This information is not intended to replace advice given to you by your health care provider. Make sure you discuss any questions you have with your health care provider. Document Released: 10/15/2015 Document Revised: 06/07/2016 Document Reviewed: 07/20/2015 Elsevier Interactive Patient Education  2017 [place] Prevention in the Home Falls can cause injuries. They can happen to people of all ages. There are many things you can do to make your home safe and to help prevent falls. What can I do on the outside of my home? Regularly fix the edges of walkways and driveways and fix any cracks. Remove anything that might make you trip as you walk through a door, such as a raised step or threshold. Trim any bushes or trees on the path to your home. Use bright outdoor lighting. Clear any walking paths of anything that might make someone trip, such as rocks or tools. Regularly check to see if handrails are loose or broken. Make sure that both sides of any steps have handrails. Any raised decks and porches should have guardrails on the edges. Have any leaves, snow, or ice cleared regularly. Use sand or salt on  walking paths during winter. Clean up any spills in your garage right away. This includes oil or grease spills. What can I do in the bathroom? Use night lights. Install grab bars by the toilet and in the tub and shower. Do not use towel bars as grab bars. Use non-skid mats or decals in the tub or shower. If you need to sit down in the shower, use a plastic, non-slip stool. Keep the floor dry. Clean up any water that spills on the floor as soon as it happens. Remove soap buildup in the tub or shower regularly. Attach bath mats securely with double-sided non-slip rug tape. Do not have throw rugs and other things on the floor that can make you trip. What can I do in the bedroom? Use night lights. Make sure that you have a light by your bed that is easy to reach. Do not use any sheets or blankets that are too big for your bed. They should not hang down onto the floor. Have a firm chair that has side arms. You can use this for support while you get dressed. Do not have throw rugs and other things on the floor that can make you trip. What can I do in the kitchen? Clean up any spills right away. Avoid walking on wet floors. Keep items that you use a lot in easy-to-reach places. If you need to reach something above you, use a strong step stool that has a grab bar. Keep electrical cords out of the way. Do not use floor polish or wax that makes floors  slippery. If you must use wax, use non-skid floor wax. Do not have throw rugs and other things on the floor that can make you trip. What can I do with my stairs? Do not leave any items on the stairs. Make sure that there are handrails on both sides of the stairs and use them. Fix handrails that are broken or loose. Make sure that handrails are as long as the stairways. Check any carpeting to make sure that it is firmly attached to the stairs. Fix any carpet that is loose or worn. Avoid having throw rugs at the top or bottom of the stairs. If you do  have throw rugs, attach them to the floor with carpet tape. Make sure that you have a light switch at the top of the stairs and the bottom of the stairs. If you do not have them, ask someone to add them for you. What else can I do to help prevent falls? Wear shoes that: Do not have high heels. Have rubber bottoms. Are comfortable and fit you well. Are closed at the toe. Do not wear sandals. If you use a stepladder: Make sure that it is fully opened. Do not climb a closed stepladder. Make sure that both sides of the stepladder are locked into place. Ask someone to hold it for you, if possible. Clearly mark and make sure that you can see: Any grab bars or handrails. First and last steps. Where the edge of each step is. Use tools that help you move around (mobility aids) if they are needed. These include: Canes. Walkers. Scooters. Crutches. Turn on the lights when you go into a dark area. Replace any light bulbs as soon as they burn out. Set up your furniture so you have a clear path. Avoid moving your furniture around. If any of your floors are uneven, fix them. If there are any pets around you, be aware of where they are. Review your medicines with your doctor. Some medicines can make you feel dizzy. This can increase your chance of falling. Ask your doctor what other things that you can do to help prevent falls. This information is not intended to replace advice given to you by your health care provider. Make sure you discuss any questions you have with your health care provider. Document Released: 07/15/2009 Document Revised: 02/24/2016 Document Reviewed: 10/23/2014 Elsevier Interactive Patient Education  2017 [employer].

## 2021-12-09 ENCOUNTER — Other Ambulatory Visit: Payer: Self-pay | Admitting: Internal Medicine

## 2021-12-14 DIAGNOSIS — H43813 Vitreous degeneration, bilateral: Secondary | ICD-10-CM | POA: Diagnosis not present

## 2021-12-14 DIAGNOSIS — H353122 Nonexudative age-related macular degeneration, left eye, intermediate dry stage: Secondary | ICD-10-CM | POA: Diagnosis not present

## 2021-12-14 DIAGNOSIS — H353211 Exudative age-related macular degeneration, right eye, with active choroidal neovascularization: Secondary | ICD-10-CM | POA: Diagnosis not present

## 2021-12-14 DIAGNOSIS — H43392 Other vitreous opacities, left eye: Secondary | ICD-10-CM | POA: Diagnosis not present

## 2021-12-28 DIAGNOSIS — R2689 Other abnormalities of gait and mobility: Secondary | ICD-10-CM | POA: Diagnosis not present

## 2021-12-28 DIAGNOSIS — M5489 Other dorsalgia: Secondary | ICD-10-CM | POA: Diagnosis not present

## 2021-12-28 DIAGNOSIS — R293 Abnormal posture: Secondary | ICD-10-CM | POA: Diagnosis not present

## 2022-01-05 DIAGNOSIS — R293 Abnormal posture: Secondary | ICD-10-CM | POA: Diagnosis not present

## 2022-01-05 DIAGNOSIS — M5489 Other dorsalgia: Secondary | ICD-10-CM | POA: Diagnosis not present

## 2022-01-05 DIAGNOSIS — R2689 Other abnormalities of gait and mobility: Secondary | ICD-10-CM | POA: Diagnosis not present

## 2022-01-11 DIAGNOSIS — D225 Melanocytic nevi of trunk: Secondary | ICD-10-CM | POA: Diagnosis not present

## 2022-01-11 DIAGNOSIS — L739 Follicular disorder, unspecified: Secondary | ICD-10-CM | POA: Diagnosis not present

## 2022-01-11 DIAGNOSIS — D2270 Melanocytic nevi of unspecified lower limb, including hip: Secondary | ICD-10-CM | POA: Diagnosis not present

## 2022-01-11 DIAGNOSIS — L821 Other seborrheic keratosis: Secondary | ICD-10-CM | POA: Diagnosis not present

## 2022-01-11 DIAGNOSIS — D1801 Hemangioma of skin and subcutaneous tissue: Secondary | ICD-10-CM | POA: Diagnosis not present

## 2022-01-11 DIAGNOSIS — L814 Other melanin hyperpigmentation: Secondary | ICD-10-CM | POA: Diagnosis not present

## 2022-01-11 DIAGNOSIS — D239 Other benign neoplasm of skin, unspecified: Secondary | ICD-10-CM | POA: Diagnosis not present

## 2022-01-11 DIAGNOSIS — Z87898 Personal history of other specified conditions: Secondary | ICD-10-CM | POA: Diagnosis not present

## 2022-01-11 DIAGNOSIS — D2260 Melanocytic nevi of unspecified upper limb, including shoulder: Secondary | ICD-10-CM | POA: Diagnosis not present

## 2022-01-16 ENCOUNTER — Encounter: Payer: Self-pay | Admitting: Internal Medicine

## 2022-01-16 ENCOUNTER — Other Ambulatory Visit: Payer: Self-pay | Admitting: Internal Medicine

## 2022-01-16 MED ORDER — LORAZEPAM 1 MG PO TABS: 1.0000 mg | ORAL_TABLET | Freq: Two times a day (BID) | ORAL | 5 refills | Status: DC | PRN

## 2022-01-19 DIAGNOSIS — R293 Abnormal posture: Secondary | ICD-10-CM | POA: Diagnosis not present

## 2022-01-19 DIAGNOSIS — M5489 Other dorsalgia: Secondary | ICD-10-CM | POA: Diagnosis not present

## 2022-01-19 DIAGNOSIS — R2689 Other abnormalities of gait and mobility: Secondary | ICD-10-CM | POA: Diagnosis not present

## 2022-01-24 DIAGNOSIS — H43813 Vitreous degeneration, bilateral: Secondary | ICD-10-CM | POA: Diagnosis not present

## 2022-01-24 DIAGNOSIS — H43392 Other vitreous opacities, left eye: Secondary | ICD-10-CM | POA: Diagnosis not present

## 2022-01-24 DIAGNOSIS — H353211 Exudative age-related macular degeneration, right eye, with active choroidal neovascularization: Secondary | ICD-10-CM | POA: Diagnosis not present

## 2022-01-24 DIAGNOSIS — H353122 Nonexudative age-related macular degeneration, left eye, intermediate dry stage: Secondary | ICD-10-CM | POA: Diagnosis not present

## 2022-01-26 DIAGNOSIS — E039 Hypothyroidism, unspecified: Secondary | ICD-10-CM | POA: Diagnosis not present

## 2022-01-26 DIAGNOSIS — R2689 Other abnormalities of gait and mobility: Secondary | ICD-10-CM | POA: Diagnosis not present

## 2022-01-26 DIAGNOSIS — M5489 Other dorsalgia: Secondary | ICD-10-CM | POA: Diagnosis not present

## 2022-01-26 DIAGNOSIS — F439 Reaction to severe stress, unspecified: Secondary | ICD-10-CM | POA: Diagnosis not present

## 2022-01-26 DIAGNOSIS — R635 Abnormal weight gain: Secondary | ICD-10-CM | POA: Diagnosis not present

## 2022-01-26 DIAGNOSIS — R293 Abnormal posture: Secondary | ICD-10-CM | POA: Diagnosis not present

## 2022-01-29 ENCOUNTER — Other Ambulatory Visit: Payer: Self-pay | Admitting: Internal Medicine

## 2022-02-03 DIAGNOSIS — R293 Abnormal posture: Secondary | ICD-10-CM | POA: Diagnosis not present

## 2022-02-03 DIAGNOSIS — R2689 Other abnormalities of gait and mobility: Secondary | ICD-10-CM | POA: Diagnosis not present

## 2022-02-03 DIAGNOSIS — M5489 Other dorsalgia: Secondary | ICD-10-CM | POA: Diagnosis not present

## 2022-02-09 DIAGNOSIS — R293 Abnormal posture: Secondary | ICD-10-CM | POA: Diagnosis not present

## 2022-02-09 DIAGNOSIS — R2689 Other abnormalities of gait and mobility: Secondary | ICD-10-CM | POA: Diagnosis not present

## 2022-02-09 DIAGNOSIS — M5489 Other dorsalgia: Secondary | ICD-10-CM | POA: Diagnosis not present

## 2022-02-16 DIAGNOSIS — R293 Abnormal posture: Secondary | ICD-10-CM | POA: Diagnosis not present

## 2022-02-16 DIAGNOSIS — R2689 Other abnormalities of gait and mobility: Secondary | ICD-10-CM | POA: Diagnosis not present

## 2022-02-16 DIAGNOSIS — M5489 Other dorsalgia: Secondary | ICD-10-CM | POA: Diagnosis not present

## 2022-02-28 DIAGNOSIS — H353122 Nonexudative age-related macular degeneration, left eye, intermediate dry stage: Secondary | ICD-10-CM | POA: Diagnosis not present

## 2022-02-28 DIAGNOSIS — H43813 Vitreous degeneration, bilateral: Secondary | ICD-10-CM | POA: Diagnosis not present

## 2022-02-28 DIAGNOSIS — H43392 Other vitreous opacities, left eye: Secondary | ICD-10-CM | POA: Diagnosis not present

## 2022-02-28 DIAGNOSIS — H353211 Exudative age-related macular degeneration, right eye, with active choroidal neovascularization: Secondary | ICD-10-CM | POA: Diagnosis not present

## 2022-04-01 IMAGING — CT CT CHEST W/O CM
2 of 3 series · 15 of 36 positions shown, 18 images · non-contrast
Comparison: Report of CT chest 01/28/2021 from [facility], images are
unavailable

CLINICAL DATA: Abnormal CT showing enlarged right axillary node and
ground-glass density right lower lobe, follow-up

EXAM:
CT CHEST WITHOUT CONTRAST
TECHNIQUE: Multidetector CT imaging of the chest was performed following the
standard protocol without IV contrast.

[Series 2: thorax · axial · 0.75mm/px · z∈[-313,-45]mm · 12 of 158 slices shown, 15 images]
[im 12/158  mediastinal]
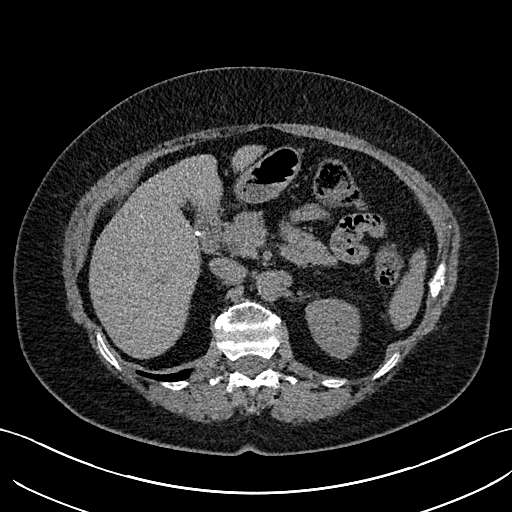
[im 12/158  lung]
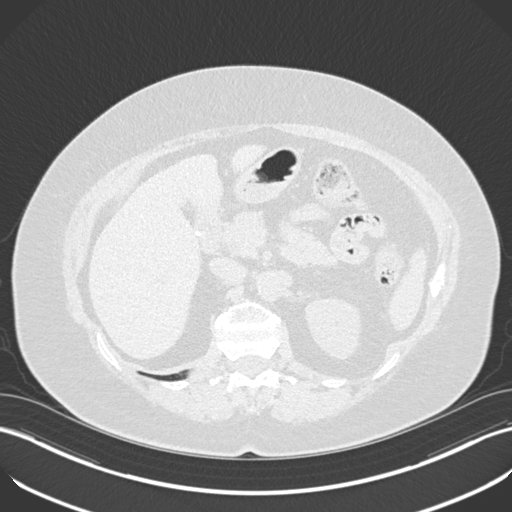
[im 24/158  lung]
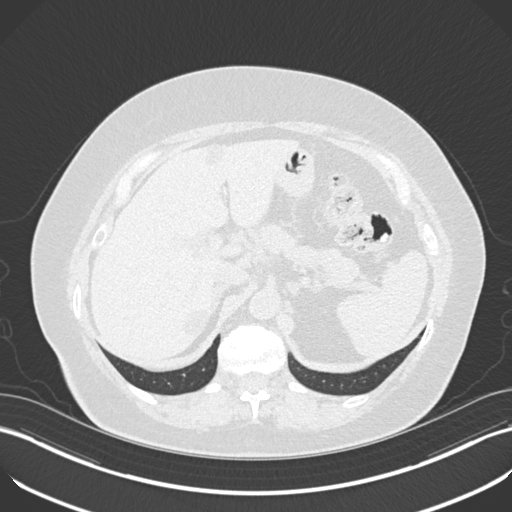
[im 35/158  lung]
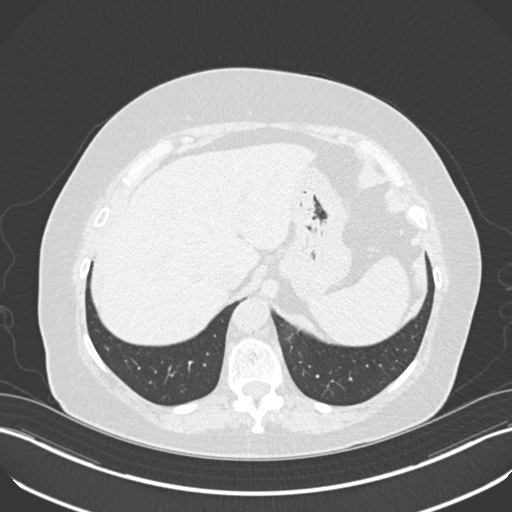
[im 47/158  lung]
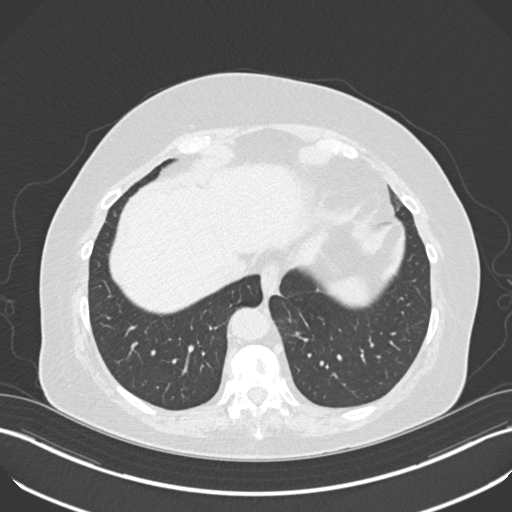
[im 59/158  mediastinal]
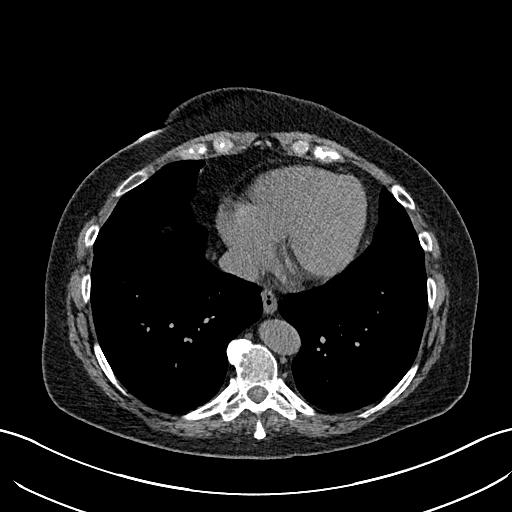
[im 59/158  lung]
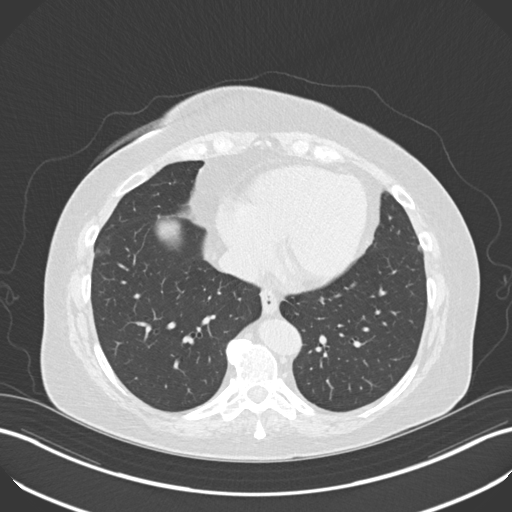
[im 70/158  lung]
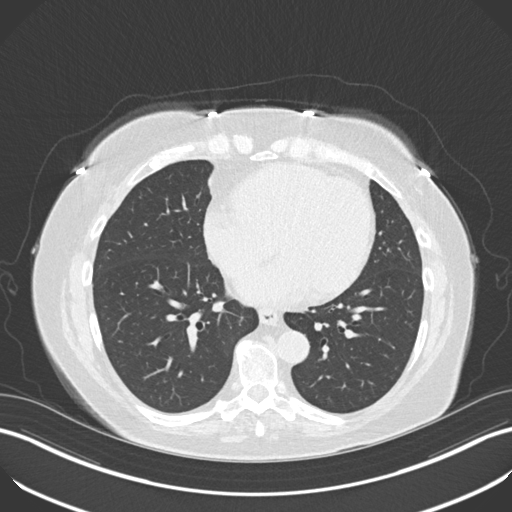
[im 88/158  lung]
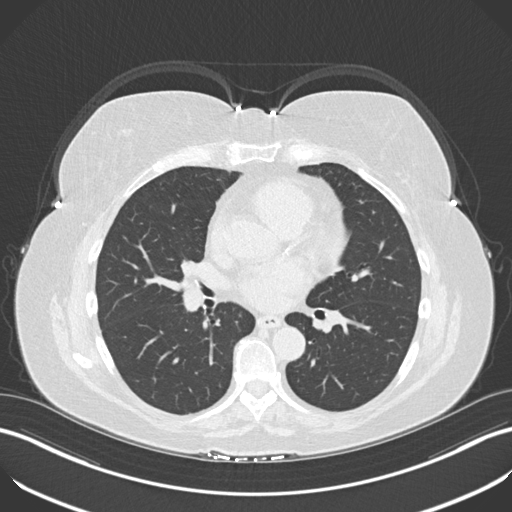
[im 99/158  lung]
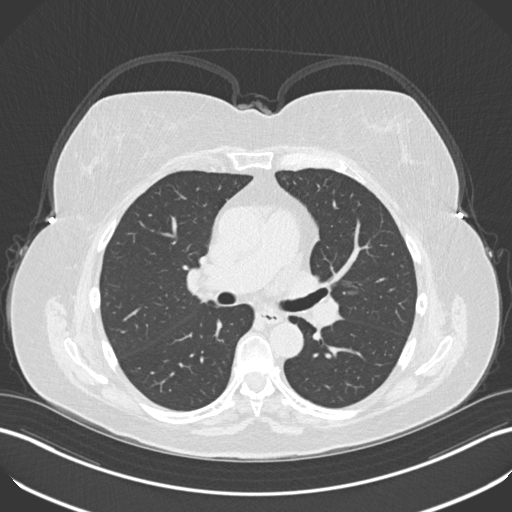
[im 111/158  mediastinal]
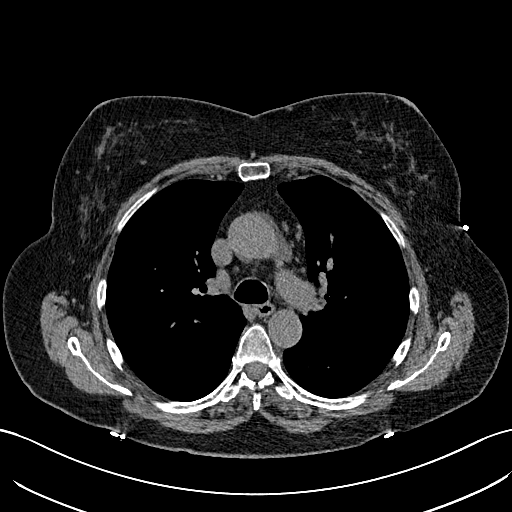
[im 111/158  lung]
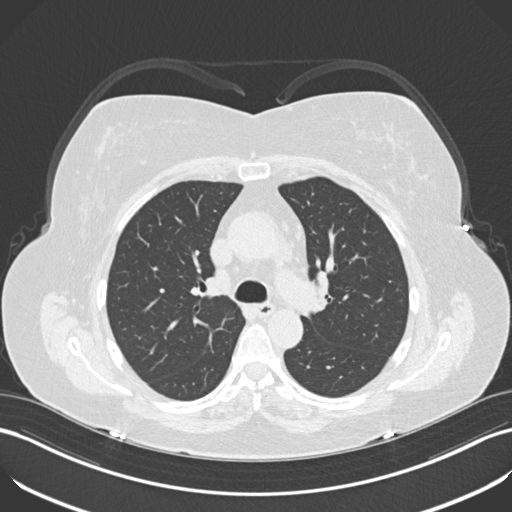
[im 123/158  lung]
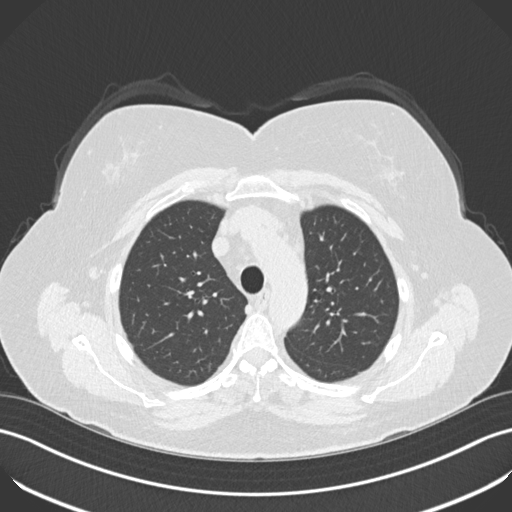
[im 134/158  lung]
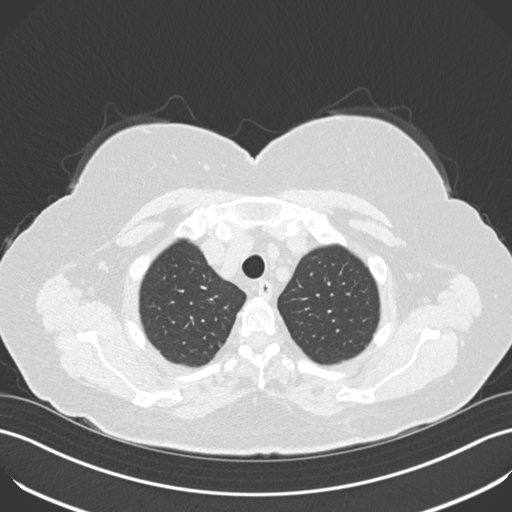
[im 146/158  lung]
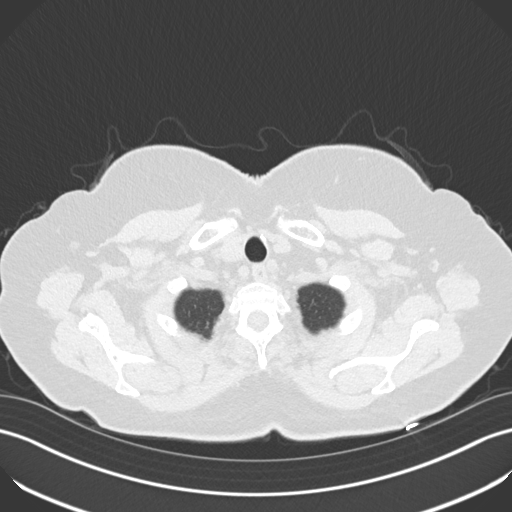

[Series 5: coronal · coronal · 0.65mm/px · 3 of 123 slices shown]
[im 25/123  lung]
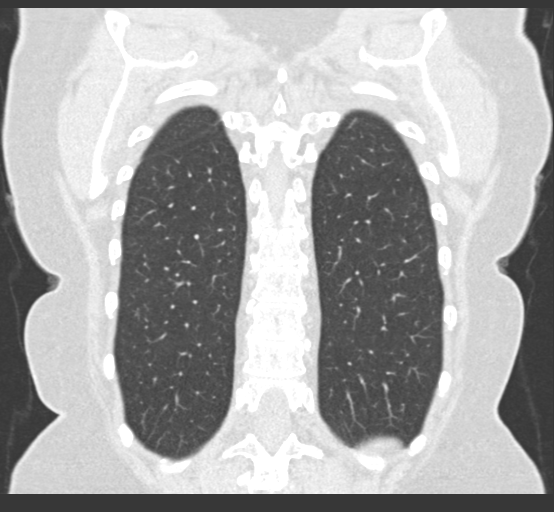
[im 49/123  lung]
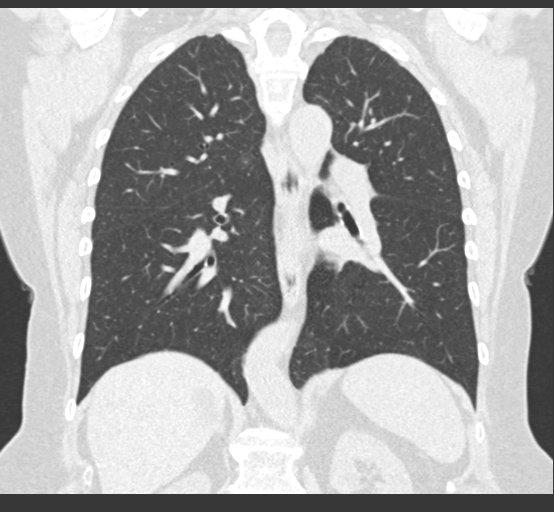
[im 74/123  lung]
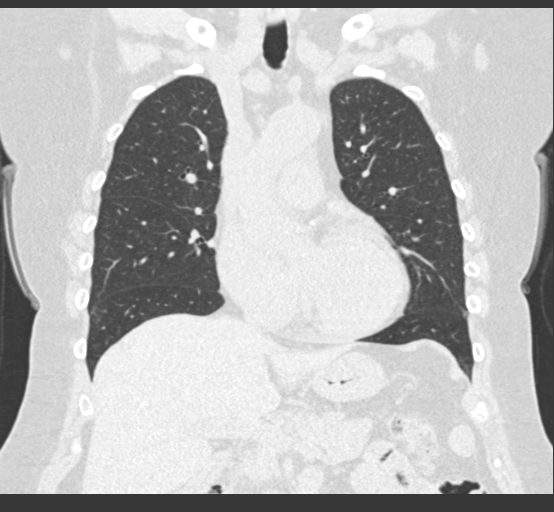

[15 of 36 positions shown; findings below may reference images not displayed]

FINDINGS: Cardiovascular: Normal heart size. No pericardial effusion. Minimal
coronary artery calcification. Thoracic aorta is normal in caliber
with trace calcified plaque.

Mediastinum/Nodes: No enlarged lymph nodes. Nonenlarged right
axillary nodes are present measuring up to 9 mm probable partially
imaged thyroidectomy.

Lungs/Pleura: No nodule or mass. Prior images are unavailable for
comparison, but there is no ground-glass opacity identified in the
right lower lobe suggesting resolution of prior atelectasis or
infectious/inflammatory process. No pleural effusion.

Upper Abdomen: Cholecystectomy. Small incidental hepatic cysts. No
acute abnormality.

Musculoskeletal: Chronic T11 compression fracture. No acute osseous
abnormality. Unremarkable chest wall.
IMPRESSION: Prior images are unavailable for comparison. However, there is no
ground-glass opacity in the right lower lobe at this time. There are
no enlarged right axillary nodes. The largest right axillary node
measures up to 9 mm, which is smaller than on the prior study
report.

## 2022-04-12 DIAGNOSIS — E039 Hypothyroidism, unspecified: Secondary | ICD-10-CM | POA: Diagnosis not present

## 2022-04-12 DIAGNOSIS — F4321 Adjustment disorder with depressed mood: Secondary | ICD-10-CM | POA: Diagnosis not present

## 2022-04-14 DIAGNOSIS — H353122 Nonexudative age-related macular degeneration, left eye, intermediate dry stage: Secondary | ICD-10-CM | POA: Diagnosis not present

## 2022-04-14 DIAGNOSIS — H353211 Exudative age-related macular degeneration, right eye, with active choroidal neovascularization: Secondary | ICD-10-CM | POA: Diagnosis not present

## 2022-04-14 DIAGNOSIS — H43813 Vitreous degeneration, bilateral: Secondary | ICD-10-CM | POA: Diagnosis not present

## 2022-04-14 DIAGNOSIS — H43392 Other vitreous opacities, left eye: Secondary | ICD-10-CM | POA: Diagnosis not present

## 2022-05-02 ENCOUNTER — Ambulatory Visit (INDEPENDENT_AMBULATORY_CARE_PROVIDER_SITE_OTHER): Payer: Medicare Other | Admitting: Internal Medicine

## 2022-05-02 ENCOUNTER — Encounter: Payer: Self-pay | Admitting: Internal Medicine

## 2022-05-02 VITALS — BP 120/70 | HR 75 | Temp 98.5°F | Ht 67.0 in | Wt 204.0 lb

## 2022-05-02 DIAGNOSIS — U071 COVID-19: Secondary | ICD-10-CM | POA: Diagnosis not present

## 2022-05-02 DIAGNOSIS — R739 Hyperglycemia, unspecified: Secondary | ICD-10-CM

## 2022-05-02 DIAGNOSIS — E559 Vitamin D deficiency, unspecified: Secondary | ICD-10-CM

## 2022-05-02 DIAGNOSIS — R070 Pain in throat: Secondary | ICD-10-CM | POA: Diagnosis not present

## 2022-05-02 LAB — POC COVID19 BINAXNOW: SARS Coronavirus 2 Ag: POSITIVE — AB

## 2022-05-02 MED ORDER — HYDROCODONE BIT-HOMATROP MBR 5-1.5 MG/5ML PO SOLN: 5.0000 mL | Freq: Four times a day (QID) | ORAL | 0 refills | Status: DC | PRN

## 2022-05-02 MED ORDER — NIRMATRELVIR/RITONAVIR (PAXLOVID)TABLET: 3.0000 | ORAL_TABLET | Freq: Two times a day (BID) | ORAL | 0 refills | Status: AC

## 2022-05-02 MED ORDER — ONDANSETRON HCL 4 MG PO TABS: 4.0000 mg | ORAL_TABLET | Freq: Three times a day (TID) | ORAL | 1 refills | Status: DC | PRN

## 2022-05-02 NOTE — Patient Instructions (Signed)
Please take all new medication as prescribed - the antibiotic (paxlovid), cough medicine, and nausea medication if needed  Please continue all other medications as before, and refills have been done if requested.  Please have the pharmacy call with any other refills you may need.  Please keep your appointments with your specialists as you may have planned

## 2022-05-02 NOTE — Progress Notes (Unsigned)
Patient ID: Patricia Greene, female   DOB: [birth year], 66 y.o.   MRN: [id number]        Chief Complaint: follow up URI symptoms       HPI:  Patricia Greene is a 66 y.o. female  Here with 2-3 days acute onset fever, facial pain, pressure, headache, general weakness and malaise, with rather severe ST and cough, but pt denies chest pain, wheezing, increased sob or doe, orthopnea, PND, increased LE swelling, palpitations, dizziness or syncope.  Also denies loss of taste and smell, but has nausea.  Denies vomiting, abd pain or diarrhea.   Pt denies polydipsia, polyuria, or new focal neuro s/s.    Pt denies fever, wt loss, night sweats, loss of appetite, or other constitutional symptoms    Wt Readings from Last 3 Encounters:  05/02/22 204 lb (92.5 kg)  11/15/21 210 lb (95.3 kg)  03/22/21 211 lb (95.7 kg)   BP Readings from Last 3 Encounters:  05/02/22 120/70  11/15/21 124/80  03/22/21 118/78         Past Medical History:  Diagnosis Date   Allergic rhinitis    Allergy    Anxiety state, unspecified    Arthritis    left thumb   Carotid bruit    Cervicalgia    Diverticulosis    Fibromyalgia    better since breast implants were removed   GERD (gastroesophageal reflux disease)    Helicobacter pylori gastritis 10/02/2017   EGD and bx 12/.2018 Quad tx    History of migraine headaches    Hypothyroidism    nodular goiter   IBS (irritable bowel syndrome)    Insomnia    Obesity    Unspecified vitamin D deficiency    Past Surgical History:  Procedure Laterality Date   BREAST ENHANCEMENT SURGERY     breast implants removed     CHOLECYSTECTOMY N/A 06/19/2017   Procedure: LAPAROSCOPIC CHOLECYSTECTOMY WITH INTRAOPERATIVE CHOLANGIOGRAM,REPAIR OF DUODENAL INJURY;  Surgeon: Kieth Brightly, Arta Bruce, MD;  Location: [facility];  Service: General;  Laterality: N/A;   COLONOSCOPY  1999, 2004 Sammuel Cooper), 08/09/2011   1999, 2004: diverticulosis   COLONOSCOPY  08/23/2020   [facility]   finger reattachment      THYROIDECTOMY  1973   right, toxic nodule   TONSILLECTOMY      reports that she has never smoked. She has never used smokeless tobacco. She reports that she does not drink alcohol and does not use drugs. family history includes Allergic rhinitis in her mother; Asthma in her mother; Breast cancer in her mother; Colon cancer (age of onset: 20) in her father; Colon polyps in her sister; Heart disease in her mother. Allergies  Allergen Reactions   Cortisone     All steroids   Silicone Other (See Comments)    Pt reports autoimmune diseases, fatigue, hair falling out   Current Outpatient Medications on File Prior to Visit  Medication Sig Dispense Refill   baclofen (LIORESAL) 20 MG tablet TAKE 1 TABLET BY MOUTH AT BEDTIME AS NEEDED 30 tablet 0   FLUoxetine (PROZAC) 20 MG tablet Take 1 tablet (20 mg total) by mouth daily. 90 tablet 1   Hyoscyamine Sulfate SL 0.125 MG SUBL 1-2 every 4 hours as needed for abdominal cramps 60 tablet 1   LORazepam (ATIVAN) 1 MG tablet Take 1 tablet (1 mg total) by mouth 2 (two) times daily as needed. for anxiety 60 tablet 5   meloxicam (MOBIC) 15 MG tablet Take by mouth.  methocarbamol (ROBAXIN) 500 MG tablet TAKE 1 TABLET BY MOUTH EVERY 6 HOURS AS NEEDED FOR MUSCLE SPASM 90 tablet 0   thyroid (ARMOUR) 120 MG tablet Take 120 mg by mouth daily.      tobramycin (TOBREX) 0.3 % ophthalmic solution INSTILL 1 DROP INTO RIGHT EYE 4 TIMES DAILY. BEGIN 1 DAY PRIOR TO PROCEDURE, USE DAY OF PROCEDURE, AND USE 1 DAY AFTER PROCEDURE THEN STOP     No current facility-administered medications on file prior to visit.        ROS:  All others reviewed and negative.  Objective        PE:  BP 120/70 (BP Location: Right Arm, Patient Position: Sitting, Cuff Size: Large)   Pulse 75   Temp 98.5 F (36.9 C) (Oral)   Ht '5\' 7"'$  (1.702 m)   Wt 204 lb (92.5 kg)   SpO2 97%   BMI 31.95 kg/m                 Constitutional: Pt appears in NAD               HENT: Head: NCAT.                 Right Ear: External ear normal.                 Left Ear: External ear normal. Bilat tm's with mild erythema.  Max sinus areas non tender.  Pharynx with mild erythema, no exudate                Eyes: . Pupils are equal, round, and reactive to light. Conjunctivae and EOM are normal               Nose: without d/c or deformity               Neck: Neck supple. Gross normal ROM               Cardiovascular: Normal rate and regular rhythm.                 Pulmonary/Chest: Effort normal and breath sounds without rales or wheezing.                               Neurological: Pt is alert. At baseline orientation, motor grossly intact               Skin: Skin is warm. No rashes, no other new lesions, LE edema - none               Psychiatric: Pt behavior is normal without agitation   Micro: none  Cardiac tracings I have personally interpreted today:  none  Pertinent Radiological findings (summarize): none   Lab Results  Component Value Date   WBC 4.4 08/04/2020   HGB 13.1 08/04/2020   HCT 39.0 08/04/2020   PLT 214.0 08/04/2020   GLUCOSE 84 08/04/2020   CHOL 138 03/18/2019   TRIG 86.0 03/18/2019   HDL 49.80 03/18/2019   LDLCALC 71 03/18/2019   ALT 13 08/04/2020   AST 17 08/04/2020   NA 139 08/04/2020   K 3.7 08/04/2020   CL 104 08/04/2020   CREATININE 0.65 08/04/2020   BUN 13 08/04/2020   CO2 27 08/04/2020   TSH 0.25 (L) 08/04/2020   HGBA1C 5.6 08/04/2020   SARS Coronavirus 2 Ag Negative Positive Abnormal     Assessment/Plan:  Patricia Geist  Greene is a 66 y.o. White or Caucasian [1] female with  has a past medical history of Allergic rhinitis, Allergy, Anxiety state, unspecified, Arthritis, Carotid bruit, Cervicalgia, Diverticulosis, Fibromyalgia, GERD (gastroesophageal reflux disease), Helicobacter pylori gastritis (10/02/2017), History of migraine headaches, Hypothyroidism, IBS (irritable bowel syndrome), Insomnia, Obesity, and Unspecified vitamin D deficiency.  COVID-19 virus  infection Mild to mod, for paxlovid course, cough med prn, and zofran prn, also otc vit d, c and zinc,pt to f/u any worsening symptoms or concerns  Vitamin D deficiency Last vitamin D Lab Results  Component Value Date   VD25OH 34.18 08/04/2020   Low, to start oral replacement   Hyperglycemia Lab Results  Component Value Date   HGBA1C 5.6 08/04/2020   Stable, pt to continue current medical treatment  - diet, wt control, excercise  Followup: Return if symptoms worsen or fail to improve.  Cathlean Cower, MD 05/03/2022 1:47 PM [facility]

## 2022-05-03 ENCOUNTER — Encounter: Payer: Self-pay | Admitting: Internal Medicine

## 2022-05-03 DIAGNOSIS — U071 COVID-19: Secondary | ICD-10-CM | POA: Insufficient documentation

## 2022-05-03 NOTE — Assessment & Plan Note (Signed)
Last vitamin D Lab Results  Component Value Date   VD25OH 34.18 08/04/2020   Low, to start oral replacement

## 2022-05-03 NOTE — Assessment & Plan Note (Signed)
Lab Results  Component Value Date   HGBA1C 5.6 08/04/2020   Stable, pt to continue current medical treatment  - diet, wt control, excercise

## 2022-05-03 NOTE — Assessment & Plan Note (Signed)
Mild to mod, for paxlovid course, cough med prn, and zofran prn, also otc vit d, c and zinc,pt to f/u any worsening symptoms or concerns

## 2022-05-04 ENCOUNTER — Ambulatory Visit: Payer: Medicare Other | Admitting: Internal Medicine

## 2022-05-09 DIAGNOSIS — H109 Unspecified conjunctivitis: Secondary | ICD-10-CM | POA: Diagnosis not present

## 2022-05-11 ENCOUNTER — Encounter: Payer: Self-pay | Admitting: Emergency Medicine

## 2022-05-11 ENCOUNTER — Ambulatory Visit (INDEPENDENT_AMBULATORY_CARE_PROVIDER_SITE_OTHER): Payer: Medicare Other | Admitting: Emergency Medicine

## 2022-05-11 VITALS — BP 132/70 | HR 64 | Temp 97.9°F | Ht 67.0 in | Wt 206.1 lb

## 2022-05-11 DIAGNOSIS — U071 COVID-19: Secondary | ICD-10-CM

## 2022-05-11 DIAGNOSIS — R051 Acute cough: Secondary | ICD-10-CM | POA: Diagnosis not present

## 2022-05-11 MED ORDER — HYDROCODONE BIT-HOMATROP MBR 5-1.5 MG/5ML PO SOLN: 5.0000 mL | Freq: Four times a day (QID) | ORAL | 0 refills | Status: DC | PRN

## 2022-05-11 NOTE — Assessment & Plan Note (Signed)
Running its course.  Clinically stable. No complications.  2 weeks into her illness. No pneumonia.  Stable vital signs. Slowly improving.

## 2022-05-11 NOTE — Patient Instructions (Signed)

## 2022-05-11 NOTE — Assessment & Plan Note (Signed)
Chief remaining complaint. Continue over-the-counter Robitussin DM Tessalon does not work for her. Continue Hycodan as needed Recommend cough drops and iced drinks Advised to rest and stay well-hydrated Instructed to contact the office if no better or worse during the next several days. ED precautions given.

## 2022-05-11 NOTE — Progress Notes (Signed)
Patricia Greene 66 y.o.   Chief Complaint  Patient presents with   Cough    COVID dx last Tuesday, still having cough     HISTORY OF PRESENT ILLNESS: Acute problem visit today.  Patient of Dr. Billey Gosling. This is a 66 y.o. female complaining of persistent cough. Diagnosed with COVID infection on 05/02/2022.  Day 1 of symptoms 04/25/2022. Slowly getting better.  Still having nonproductive persistent cough. Denies difficulty breathing or wheezing.  Denies chest pain.  No longer having fever or chills. Able to eat and drink.  Denies nausea or vomiting. No other complaints or medical concerns today.  HPI   Prior to Admission medications   Medication Sig Start Date End Date Taking? Authorizing Provider  baclofen (LIORESAL) 20 MG tablet TAKE 1 TABLET BY MOUTH AT BEDTIME AS NEEDED 01/30/22   Binnie Rail, MD  FLUoxetine (PROZAC) 20 MG tablet Take 1 tablet (20 mg total) by mouth daily. 11/15/21   Binnie Rail, MD  HYDROcodone bit-homatropine (HYCODAN) 5-1.5 MG/5ML syrup Take 5 mLs by mouth every 6 (six) hours as needed for up to 10 days. 05/02/22 05/12/22  Biagio Borg, MD  Hyoscyamine Sulfate SL 0.125 MG SUBL 1-2 every 4 hours as needed for abdominal cramps 08/23/20   Gatha Mayer, MD  LORazepam (ATIVAN) 1 MG tablet Take 1 tablet (1 mg total) by mouth 2 (two) times daily as needed. for anxiety 01/16/22   Binnie Rail, MD  meloxicam (MOBIC) 15 MG tablet Take by mouth. 08/18/21   [provider]  methocarbamol (ROBAXIN) 500 MG tablet TAKE 1 TABLET BY MOUTH EVERY 6 HOURS AS NEEDED FOR MUSCLE SPASM 09/14/21   Binnie Rail, MD  ondansetron (ZOFRAN) 4 MG tablet Take 1 tablet (4 mg total) by mouth every 8 (eight) hours as needed for nausea or vomiting. 05/02/22   Biagio Borg, MD  thyroid (ARMOUR) 120 MG tablet Take 120 mg by mouth daily.     [provider]  tobramycin (TOBREX) 0.3 % ophthalmic solution INSTILL 1 DROP INTO RIGHT EYE 4 TIMES DAILY. BEGIN 1 DAY PRIOR TO  PROCEDURE, USE DAY OF PROCEDURE, AND USE 1 DAY AFTER PROCEDURE THEN STOP 07/19/21   [provider]    Allergies  Allergen Reactions   Cortisone     All steroids   Silicone Other (See Comments)    Pt reports autoimmune diseases, fatigue, hair falling out    Patient Active Problem List   Diagnosis Date Noted   COVID-19 virus infection 05/03/2022   Bilateral low back pain without sciatica 11/15/2021   Cellulitis 11/15/2021   Depression 11/15/2021   Axillary lymphadenopathy 03/22/2021   Ground glass opacity present on imaging of lung 03/22/2021   ETD (Eustachian tube dysfunction), left 12/08/2020   Hyperglycemia 08/04/2020   Obesity 03/17/2019   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   Unilateral primary osteoarthritis, left knee 02/21/2018   Chronic pain of left knee 03/31/1600   Helicobacter pylori gastritis 10/02/2017   Hx of adenomatous polyp of colon 09/27/2017   Closed wedge compression fracture of T11 vertebra ([place]) 03/15/2017   Reactive airway disease with wheezing 03/14/2017   Back muscle spasm 10/04/2016   H/O breast implant 04/15/2016   Osteopenia 02/01/2015   Allergic rhinitis, cause unspecified 03/11/2011   Vitamin D deficiency 03/13/2010   Migraine headache 03/13/2010   CAROTID BRUIT 03/13/2010   Hypothyroidism 03/09/2010   INSOMNIA, CHRONIC 02/20/2008   Anxiety 09/06/2007   FIBROMYALGIA 09/06/2007   IRRITABLE  BOWEL SYNDROME, HX OF 09/06/2007    Past Medical History:  Diagnosis Date   Allergic rhinitis    Allergy    Anxiety state, unspecified    Arthritis    left thumb   Carotid bruit    Cervicalgia    Diverticulosis    Fibromyalgia    better since breast implants were removed   GERD (gastroesophageal reflux disease)    Helicobacter pylori gastritis 10/02/2017   EGD and bx 12/.2018 Quad tx    History of migraine headaches    Hypothyroidism    nodular goiter   IBS (irritable bowel syndrome)    Insomnia    Obesity    Unspecified vitamin D  deficiency     Past Surgical History:  Procedure Laterality Date   BREAST ENHANCEMENT SURGERY     breast implants removed     CHOLECYSTECTOMY N/A 06/19/2017   Procedure: LAPAROSCOPIC CHOLECYSTECTOMY WITH INTRAOPERATIVE CHOLANGIOGRAM,REPAIR OF DUODENAL INJURY;  Surgeon: Kieth Brightly, Arta Bruce, MD;  Location: [facility];  Service: General;  Laterality: N/A;   COLONOSCOPY  1999, 2004 Sammuel Cooper), 08/09/2011   1999, 2004: diverticulosis   COLONOSCOPY  08/23/2020   Gessner   finger reattachment     THYROIDECTOMY  1973   right, toxic nodule   TONSILLECTOMY      Social History   Socioeconomic History   Marital status: Divorced    Spouse name: Not on file   Number of children: 0   Years of education: Not on file   Highest education level: Not on file  Occupational History   Occupation: RN  Tobacco Use   Smoking status: Never   Smokeless tobacco: Never  Vaping Use   Vaping Use: Never used  Substance and Sexual Activity   Alcohol use: No   Drug use: No   Sexual activity: Not on file  Other Topics Concern   Not on file  Social History Narrative   2-3 caffeine drinks daily    Divorced, no children. 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, now at school system   Social Determinants of Health   Financial Resource Strain: Low Risk  (12/01/2021)   Overall Financial Resource Strain (CARDIA)    Difficulty of Paying Living Expenses: Not hard at all  Food Insecurity: No Food Insecurity (12/01/2021)   Hunger Vital Sign    Worried About Running Out of Food in the Last Year: Never true    [place] in the Last Year: Never true  Transportation Needs: No Transportation Needs (12/01/2021)   PRAPARE - Hydrologist (Medical): No    Lack of Transportation (Non-Medical): No  Physical Activity: Inactive (12/01/2021)   Exercise Vital Sign    Days of Exercise per Week: 0 days    Minutes of Exercise per Session: 0 min  Stress: No Stress Concern  Present (12/01/2021)   [place]    Feeling of Stress : Not at all  Social Connections: Socially Isolated (12/01/2021)   Social Connection and Isolation Panel [NHANES]    Frequency of Communication with Friends and Family: More than three times a week    Frequency of Social Gatherings with Friends and Family: Once a week    Attends Religious Services: Never    Marine scientist or Organizations: No    Attends Archivist Meetings: Never    Marital Status: Divorced  Human resources officer Violence: Not At Risk (12/01/2021)  Humiliation, Afraid, Rape, and Kick questionnaire    Fear of Current or Ex-Partner: No    Emotionally Abused: No    Physically Abused: No    Sexually Abused: No    Family History  Problem Relation Age of Onset   Asthma Mother    Breast cancer Mother    Allergic rhinitis Mother    Heart disease Mother    Colon cancer Father 75       died at 23   Colon polyps Sister    Esophageal cancer Neg Hx    Stomach cancer Neg Hx    Rectal cancer Neg Hx    Pancreatic cancer Neg Hx    Liver disease Neg Hx      Review of Systems  Constitutional: Negative.  Negative for chills and fever.  HENT: Negative.  Negative for congestion and sore throat.   Respiratory:  Positive for cough. Negative for hemoptysis, sputum production, shortness of breath and wheezing.   Cardiovascular: Negative.  Negative for chest pain and palpitations.  Gastrointestinal:  Negative for abdominal pain, diarrhea, nausea and vomiting.  Genitourinary: Negative.  Negative for dysuria and hematuria.  Musculoskeletal: Negative.   Skin: Negative.  Negative for rash.  Neurological:  Negative for dizziness and headaches.  All other systems reviewed and are negative.  Today's Vitals   05/11/22 1520  BP: 132/70  Pulse: 64  Temp: 97.9 F (36.6 C)  TempSrc: Oral  SpO2: 98%  Weight: 206 lb 2 oz (93.5 kg)  Height: '5\' 7"'$  (1.702 m)    Body mass index is 32.28 kg/m.   Physical Exam Vitals reviewed.  Constitutional:      Appearance: Normal appearance.  HENT:     Head: Normocephalic.     Right Ear: Tympanic membrane, ear canal and external ear normal.     Left Ear: Tympanic membrane, ear canal and external ear normal.     Mouth/Throat:     Mouth: Mucous membranes are moist.     Pharynx: Oropharynx is clear.  Eyes:     Extraocular Movements: Extraocular movements intact.     Conjunctiva/sclera: Conjunctivae normal.     Pupils: Pupils are equal, round, and reactive to light.  Cardiovascular:     Rate and Rhythm: Normal rate and regular rhythm.     Pulses: Normal pulses.     Heart sounds: Normal heart sounds.  Pulmonary:     Effort: Pulmonary effort is normal.     Breath sounds: Normal breath sounds.  Musculoskeletal:     Cervical back: No tenderness.  Lymphadenopathy:     Cervical: No cervical adenopathy.  Skin:    General: Skin is warm and dry.     Capillary Refill: Capillary refill takes less than 2 seconds.  Neurological:     General: No focal deficit present.     Mental Status: She is alert and oriented to person, place, and time.  Psychiatric:        Mood and Affect: Mood normal.        Behavior: Behavior normal.      ASSESSMENT & PLAN: Problem List Items Addressed This Visit       Other   Acute cough    Chief remaining complaint. Continue over-the-counter Robitussin DM Tessalon does not work for her. Continue Hycodan as needed Recommend cough drops and iced drinks Advised to rest and stay well-hydrated Instructed to contact the office if no better or worse during the next several days. ED precautions given.  Relevant Medications   HYDROcodone bit-homatropine (HYCODAN) 5-1.5 MG/5ML syrup   COVID-19 virus infection - Primary    Running its course.  Clinically stable. No complications.  2 weeks into her illness. No pneumonia.  Stable vital signs. Slowly improving.       Patient Instructions  Cough, Adult A cough helps to clear your throat and lungs. A cough may be a sign of an illness or another medical condition. An acute cough may only last 2-3 weeks, while a chronic cough may last 8 or more weeks. Many things can cause a cough. They include: Germs (viruses or bacteria) that attack the airway. Breathing in things that bother (irritate) your lungs. Allergies. Asthma. Mucus that runs down the back of your throat (postnasal drip). Smoking. Acid backing up from the stomach into the tube that moves food from the mouth to the stomach (gastroesophageal reflux). Some medicines. Lung problems. Other medical conditions, such as heart failure or a blood clot in the lung (pulmonary embolism). Follow these instructions at home: Medicines Take over-the-counter and prescription medicines only as told by your doctor. Talk with your doctor before you take medicines that stop a cough (cough suppressants). Lifestyle  Do not smoke, and try not to be around smoke. Do not use any products that contain nicotine or tobacco, such as cigarettes, e-cigarettes, and chewing tobacco. If you need help quitting, ask your doctor. Drink enough fluid to keep your pee (urine) pale yellow. Avoid caffeine. Do not drink alcohol if your doctor tells you not to drink. General instructions  Watch for any changes in your cough. Tell your doctor about them. Always cover your mouth when you cough. Stay away from things that make you cough, such as perfume, candles, campfire smoke, or cleaning products. If the air is dry, use a cool mist vaporizer or humidifier in your home. If your cough is worse at night, try using extra pillows to raise your head up higher while you sleep. Rest as needed. Keep all follow-up visits as told by your doctor. This is important. Contact a doctor if: You have new symptoms. You cough up pus. Your cough does not get better after 2-3 weeks, or your cough  gets worse. Cough medicine does not help your cough and you are not sleeping well. You have pain that gets worse or pain that is not helped with medicine. You have a fever. You are losing weight and you do not know why. You have night sweats. Get help right away if: You cough up blood. You have trouble breathing. Your heartbeat is very fast. These symptoms may be an emergency. Do not wait to see if the symptoms will go away. Get medical help right away. Call your local emergency services (911 in the [place] Do not drive yourself to the hospital. Summary A cough helps to clear your throat and lungs. Many things can cause a cough. Take over-the-counter and prescription medicines only as told by your doctor. Always cover your mouth when you cough. Contact a doctor if you have new symptoms or you have a cough that does not get better or gets worse. This information is not intended to replace advice given to you by your health care provider. Make sure you discuss any questions you have with your health care provider. Document Revised: 11/07/2019 Document Reviewed: 10/07/2018 Elsevier Patient Education  [place], MD [facility]

## 2022-05-23 ENCOUNTER — Ambulatory Visit: Payer: Medicare Other | Admitting: Internal Medicine

## 2022-05-24 ENCOUNTER — Encounter: Payer: Self-pay | Admitting: Internal Medicine

## 2022-05-24 ENCOUNTER — Other Ambulatory Visit: Payer: Self-pay | Admitting: Internal Medicine

## 2022-05-24 NOTE — Progress Notes (Unsigned)
Subjective:    Patient ID: Patricia Greene, female    DOB: March 03, 1956, 66 y.o.   MRN: [id number]     HPI Patricia Greene is here for follow up of her chronic medical problems, including chronic back pain ( muscle tightness), anxiety, depression, hypothyroidism, osteopenia, hyperglycemia, vit d def  Her mom died since she was here last.  She is doing well.    Had covid earlier this month.  Still coughing - bringing up phlegm that is clear.   Robaxin during day and baclofen at night.  Medications and allergies reviewed with patient and updated if appropriate.  Current Outpatient Medications on File Prior to Visit  Medication Sig Dispense Refill   baclofen (LIORESAL) 20 MG tablet TAKE 1 TABLET BY MOUTH AT BEDTIME AS NEEDED 30 tablet 0   FLUoxetine (PROZAC) 20 MG capsule Take 1 capsule by mouth once daily 90 capsule 0   HYDROcodone bit-homatropine (HYCODAN) 5-1.5 MG/5ML syrup Take 5 mLs by mouth every 6 (six) hours as needed for cough. 120 mL 0   Hyoscyamine Sulfate SL 0.125 MG SUBL 1-2 every 4 hours as needed for abdominal cramps 60 tablet 1   LORazepam (ATIVAN) 1 MG tablet Take 1 tablet (1 mg total) by mouth 2 (two) times daily as needed. for anxiety 60 tablet 5   meloxicam (MOBIC) 15 MG tablet Take by mouth.     methocarbamol (ROBAXIN) 500 MG tablet TAKE 1 TABLET BY MOUTH EVERY 6 HOURS AS NEEDED FOR MUSCLE SPASM 90 tablet 0   ondansetron (ZOFRAN) 4 MG tablet Take 1 tablet (4 mg total) by mouth every 8 (eight) hours as needed for nausea or vomiting. 30 tablet 1   thyroid (ARMOUR) 120 MG tablet Take 120 mg by mouth daily.      sulfacetamide (BLEPH-10) 10 % ophthalmic solution 1 drop 4 (four) times daily.     No current facility-administered medications on file prior to visit.     Review of Systems  Constitutional:  Positive for fatigue. Negative for chills and fever.  HENT:  Positive for voice change.   Respiratory:  Positive for cough (cleear at times). Negative for shortness of  breath and wheezing.   Cardiovascular:  Negative for chest pain, palpitations and leg swelling.  Gastrointestinal:  Negative for abdominal pain, blood in stool, constipation, diarrhea and nausea.       No gerd  Neurological:  Negative for light-headedness and headaches.       Objective:   Vitals:   05/25/22 1056  BP: 120/78  Pulse: 78  Temp: 98.5 F (36.9 C)  SpO2: 97%   BP Readings from Last 3 Encounters:  05/25/22 120/78  05/11/22 132/70  05/02/22 120/70   Wt Readings from Last 3 Encounters:  05/25/22 201 lb (91.2 kg)  05/11/22 206 lb 2 oz (93.5 kg)  05/02/22 204 lb (92.5 kg)   Body mass index is 31.48 kg/m.    Physical Exam Constitutional:      General: She is not in acute distress.    Appearance: Normal appearance.  HENT:     Head: Normocephalic and atraumatic.  Eyes:     Conjunctiva/sclera: Conjunctivae normal.  Cardiovascular:     Rate and Rhythm: Normal rate and regular rhythm.     Heart sounds: Normal heart sounds. No murmur heard. Pulmonary:     Effort: Pulmonary effort is normal. No respiratory distress.     Breath sounds: Normal breath sounds. No wheezing.  Abdominal:  General: There is no distension.     Palpations: Abdomen is soft.     Tenderness: There is no abdominal tenderness.  Musculoskeletal:     Cervical back: Neck supple.     Right lower leg: No edema.     Left lower leg: No edema.  Lymphadenopathy:     Cervical: No cervical adenopathy.  Skin:    General: Skin is warm and dry.     Findings: No rash.  Neurological:     Mental Status: She is alert. Mental status is at baseline.  Psychiatric:        Mood and Affect: Mood normal.        Behavior: Behavior normal.        Lab Results  Component Value Date   WBC 4.4 08/04/2020   HGB 13.1 08/04/2020   HCT 39.0 08/04/2020   PLT 214.0 08/04/2020   GLUCOSE 84 08/04/2020   CHOL 138 03/18/2019   TRIG 86.0 03/18/2019   HDL 49.80 03/18/2019   LDLCALC 71 03/18/2019   ALT 13  08/04/2020   AST 17 08/04/2020   NA 139 08/04/2020   K 3.7 08/04/2020   CL 104 08/04/2020   CREATININE 0.65 08/04/2020   BUN 13 08/04/2020   CO2 27 08/04/2020   TSH 0.25 (L) 08/04/2020   HGBA1C 5.6 08/04/2020     Assessment & Plan:    See Problem List for Assessment and Plan of chronic medical problems.

## 2022-05-24 NOTE — Patient Instructions (Signed)
     Blood work was ordered.    A dexa was ordered.    Medications changes include :   none   Your prescription(s) have been sent to your pharmacy.     Return in about 1 year (around 05/26/2023) for follow up.

## 2022-05-25 ENCOUNTER — Ambulatory Visit (INDEPENDENT_AMBULATORY_CARE_PROVIDER_SITE_OTHER): Payer: Medicare Other | Admitting: Internal Medicine

## 2022-05-25 VITALS — BP 120/78 | HR 78 | Temp 98.5°F | Ht 67.0 in | Wt 201.0 lb

## 2022-05-25 DIAGNOSIS — F3289 Other specified depressive episodes: Secondary | ICD-10-CM | POA: Diagnosis not present

## 2022-05-25 DIAGNOSIS — M85852 Other specified disorders of bone density and structure, left thigh: Secondary | ICD-10-CM | POA: Diagnosis not present

## 2022-05-25 DIAGNOSIS — E782 Mixed hyperlipidemia: Secondary | ICD-10-CM

## 2022-05-25 DIAGNOSIS — M62830 Muscle spasm of back: Secondary | ICD-10-CM

## 2022-05-25 DIAGNOSIS — R051 Acute cough: Secondary | ICD-10-CM | POA: Diagnosis not present

## 2022-05-25 DIAGNOSIS — F419 Anxiety disorder, unspecified: Secondary | ICD-10-CM

## 2022-05-25 DIAGNOSIS — Z83438 Family history of other disorder of lipoprotein metabolism and other lipidemia: Secondary | ICD-10-CM

## 2022-05-25 DIAGNOSIS — R739 Hyperglycemia, unspecified: Secondary | ICD-10-CM | POA: Diagnosis not present

## 2022-05-25 DIAGNOSIS — E890 Postprocedural hypothyroidism: Secondary | ICD-10-CM

## 2022-05-25 DIAGNOSIS — E559 Vitamin D deficiency, unspecified: Secondary | ICD-10-CM | POA: Diagnosis not present

## 2022-05-25 LAB — CBC WITH DIFFERENTIAL/PLATELET
Basophils Absolute: 0 10*3/uL (ref 0.0–0.1)
Basophils Relative: 0.4 % (ref 0.0–3.0)
Eosinophils Absolute: 0.1 10*3/uL (ref 0.0–0.7)
Eosinophils Relative: 1.1 % (ref 0.0–5.0)
HCT: 40.8 % (ref 36.0–46.0)
Hemoglobin: 13.2 g/dL (ref 12.0–15.0)
Lymphocytes Relative: 35.1 % (ref 12.0–46.0)
Lymphs Abs: 2.2 10*3/uL (ref 0.7–4.0)
MCHC: 32.3 g/dL (ref 30.0–36.0)
MCV: 92.9 fl (ref 78.0–100.0)
Monocytes Absolute: 0.5 10*3/uL (ref 0.1–1.0)
Monocytes Relative: 7.2 % (ref 3.0–12.0)
Neutro Abs: 3.6 10*3/uL (ref 1.4–7.7)
Neutrophils Relative %: 56.2 % (ref 43.0–77.0)
Platelets: 252 10*3/uL (ref 150.0–400.0)
RBC: 4.39 Mil/uL (ref 3.87–5.11)
RDW: 14.2 % (ref 11.5–15.5)
WBC: 6.3 10*3/uL (ref 4.0–10.5)

## 2022-05-25 LAB — LIPID PANEL
Cholesterol: 170 mg/dL (ref 0–200)
HDL: 58.6 mg/dL (ref >39.00)
LDL Cholesterol: 99 mg/dL (ref 0–99)
NonHDL: 111.1
Total CHOL/HDL Ratio: 3
Triglycerides: 62 mg/dL (ref 0.0–149.0)
VLDL: 12.4 mg/dL (ref 0.0–40.0)

## 2022-05-25 LAB — COMPREHENSIVE METABOLIC PANEL
ALT: 12 U/L (ref 0–35)
AST: 17 U/L (ref 0–37)
Albumin: 4.2 g/dL (ref 3.5–5.2)
Alkaline Phosphatase: 61 U/L (ref 39–117)
BUN: 10 mg/dL (ref 6–23)
CO2: 27 mEq/L (ref 19–32)
Calcium: 9.5 mg/dL (ref 8.4–10.5)
Chloride: 104 mEq/L (ref 96–112)
Creatinine, Ser: 0.65 mg/dL (ref 0.40–1.20)
GFR: 90.59 mL/min (ref >60.00)
Glucose, Bld: 87 mg/dL (ref 70–99)
Potassium: 3.9 mEq/L (ref 3.5–5.1)
Sodium: 137 mEq/L (ref 135–145)
Total Bilirubin: 0.7 mg/dL (ref 0.2–1.2)
Total Protein: 6.6 g/dL (ref 6.0–8.3)

## 2022-05-25 LAB — HEMOGLOBIN A1C: Hgb A1c MFr Bld: 5.9 % (ref 4.6–6.5)

## 2022-05-25 LAB — TSH: TSH: 0.05 u[IU]/mL — ABNORMAL LOW (ref 0.35–5.50)

## 2022-05-25 LAB — T3, FREE: T3, Free: 4.9 pg/mL — ABNORMAL HIGH (ref 2.3–4.2)

## 2022-05-25 LAB — VITAMIN D 25 HYDROXY (VIT D DEFICIENCY, FRACTURES): VITD: 48.52 ng/mL (ref 30.00–100.00)

## 2022-05-25 LAB — T4, FREE: Free T4: 0.96 ng/dL (ref 0.60–1.60)

## 2022-05-25 MED ORDER — METHOCARBAMOL 500 MG PO TABS
ORAL_TABLET | ORAL | 1 refills | Status: DC
Start: 2022-05-25 — End: 2023-05-31

## 2022-05-25 MED ORDER — BACLOFEN 20 MG PO TABS: 20.0000 mg | ORAL_TABLET | Freq: Every evening | ORAL | 1 refills | Status: DC | PRN

## 2022-05-25 MED ORDER — HYDROCODONE BIT-HOMATROP MBR 5-1.5 MG/5ML PO SOLN: 5.0000 mL | Freq: Four times a day (QID) | ORAL | 0 refills | Status: DC | PRN

## 2022-05-25 NOTE — Assessment & Plan Note (Signed)
Chronic Check a1c Low sugar / carb diet Stressed regular exercise  

## 2022-05-25 NOTE — Assessment & Plan Note (Signed)
Chronic DEXA due-ordered Stressed regular exercise Stressed calcium, vitamin D

## 2022-05-25 NOTE — Assessment & Plan Note (Signed)
Subacute Related to recent covid infection improving Lungs clear Will refill hycodan

## 2022-05-25 NOTE — Assessment & Plan Note (Signed)
Chronic Controlled, Stable Continue fluoxetine 20 mg daily 

## 2022-05-25 NOTE — Assessment & Plan Note (Signed)
Chronic Controlled, Stable Continue fluoxetine 20 mg daily, lorazepam 1 mg twice daily as needed

## 2022-05-25 NOTE — Assessment & Plan Note (Addendum)
Chronic Management per her integrative doctor On [place] Thyroid 120 mg daily Will check tfts

## 2022-05-25 NOTE — Assessment & Plan Note (Signed)
Chronic Intermittent Baclofen 20 mg HS prn Methocarbamol 500 mg every 6 hr during the day only as needed -- she never takes them together

## 2022-05-25 NOTE — Assessment & Plan Note (Signed)
Chronic Taking vitamin D daily Check vitamin D level  

## 2022-06-02 DIAGNOSIS — H43813 Vitreous degeneration, bilateral: Secondary | ICD-10-CM | POA: Diagnosis not present

## 2022-06-02 DIAGNOSIS — H353211 Exudative age-related macular degeneration, right eye, with active choroidal neovascularization: Secondary | ICD-10-CM | POA: Diagnosis not present

## 2022-06-02 DIAGNOSIS — H353122 Nonexudative age-related macular degeneration, left eye, intermediate dry stage: Secondary | ICD-10-CM | POA: Diagnosis not present

## 2022-06-02 DIAGNOSIS — H43392 Other vitreous opacities, left eye: Secondary | ICD-10-CM | POA: Diagnosis not present

## 2022-07-05 DIAGNOSIS — E039 Hypothyroidism, unspecified: Secondary | ICD-10-CM | POA: Diagnosis not present

## 2022-07-19 DIAGNOSIS — Z6831 Body mass index (BMI) 31.0-31.9, adult: Secondary | ICD-10-CM | POA: Diagnosis not present

## 2022-07-19 DIAGNOSIS — R7303 Prediabetes: Secondary | ICD-10-CM | POA: Diagnosis not present

## 2022-07-19 DIAGNOSIS — R198 Other specified symptoms and signs involving the digestive system and abdomen: Secondary | ICD-10-CM | POA: Diagnosis not present

## 2022-07-19 DIAGNOSIS — E039 Hypothyroidism, unspecified: Secondary | ICD-10-CM | POA: Diagnosis not present

## 2022-07-19 DIAGNOSIS — R635 Abnormal weight gain: Secondary | ICD-10-CM | POA: Diagnosis not present

## 2022-07-28 DIAGNOSIS — H353122 Nonexudative age-related macular degeneration, left eye, intermediate dry stage: Secondary | ICD-10-CM | POA: Diagnosis not present

## 2022-07-28 DIAGNOSIS — M5489 Other dorsalgia: Secondary | ICD-10-CM | POA: Diagnosis not present

## 2022-07-28 DIAGNOSIS — H353211 Exudative age-related macular degeneration, right eye, with active choroidal neovascularization: Secondary | ICD-10-CM | POA: Diagnosis not present

## 2022-07-28 DIAGNOSIS — H43392 Other vitreous opacities, left eye: Secondary | ICD-10-CM | POA: Diagnosis not present

## 2022-07-28 DIAGNOSIS — R2689 Other abnormalities of gait and mobility: Secondary | ICD-10-CM | POA: Diagnosis not present

## 2022-07-28 DIAGNOSIS — R293 Abnormal posture: Secondary | ICD-10-CM | POA: Diagnosis not present

## 2022-07-28 DIAGNOSIS — H43813 Vitreous degeneration, bilateral: Secondary | ICD-10-CM | POA: Diagnosis not present

## 2022-08-04 DIAGNOSIS — M5489 Other dorsalgia: Secondary | ICD-10-CM | POA: Diagnosis not present

## 2022-08-04 DIAGNOSIS — R293 Abnormal posture: Secondary | ICD-10-CM | POA: Diagnosis not present

## 2022-08-04 DIAGNOSIS — R2689 Other abnormalities of gait and mobility: Secondary | ICD-10-CM | POA: Diagnosis not present

## 2022-08-09 ENCOUNTER — Other Ambulatory Visit: Payer: Self-pay | Admitting: Internal Medicine

## 2022-08-10 DIAGNOSIS — R293 Abnormal posture: Secondary | ICD-10-CM | POA: Diagnosis not present

## 2022-08-10 DIAGNOSIS — R2689 Other abnormalities of gait and mobility: Secondary | ICD-10-CM | POA: Diagnosis not present

## 2022-08-10 DIAGNOSIS — M5489 Other dorsalgia: Secondary | ICD-10-CM | POA: Diagnosis not present

## 2022-08-18 DIAGNOSIS — R2689 Other abnormalities of gait and mobility: Secondary | ICD-10-CM | POA: Diagnosis not present

## 2022-08-18 DIAGNOSIS — R293 Abnormal posture: Secondary | ICD-10-CM | POA: Diagnosis not present

## 2022-08-18 DIAGNOSIS — M5489 Other dorsalgia: Secondary | ICD-10-CM | POA: Diagnosis not present

## 2022-08-22 ENCOUNTER — Other Ambulatory Visit: Payer: Self-pay

## 2022-08-22 ENCOUNTER — Other Ambulatory Visit: Payer: Self-pay | Admitting: Internal Medicine

## 2022-08-29 DIAGNOSIS — H5203 Hypermetropia, bilateral: Secondary | ICD-10-CM | POA: Diagnosis not present

## 2022-08-29 DIAGNOSIS — H524 Presbyopia: Secondary | ICD-10-CM | POA: Diagnosis not present

## 2022-08-29 DIAGNOSIS — H35711 Central serous chorioretinopathy, right eye: Secondary | ICD-10-CM | POA: Diagnosis not present

## 2022-08-29 DIAGNOSIS — H52203 Unspecified astigmatism, bilateral: Secondary | ICD-10-CM | POA: Diagnosis not present

## 2022-08-29 DIAGNOSIS — H2513 Age-related nuclear cataract, bilateral: Secondary | ICD-10-CM | POA: Diagnosis not present

## 2022-09-08 DIAGNOSIS — M85852 Other specified disorders of bone density and structure, left thigh: Secondary | ICD-10-CM | POA: Diagnosis not present

## 2022-09-08 DIAGNOSIS — Z780 Asymptomatic menopausal state: Secondary | ICD-10-CM | POA: Diagnosis not present

## 2022-09-08 DIAGNOSIS — M85851 Other specified disorders of bone density and structure, right thigh: Secondary | ICD-10-CM | POA: Diagnosis not present

## 2022-09-08 LAB — HM MAMMOGRAPHY

## 2022-09-14 DIAGNOSIS — M545 Low back pain, unspecified: Secondary | ICD-10-CM | POA: Diagnosis not present

## 2022-09-14 DIAGNOSIS — M5106 Intervertebral disc disorders with myelopathy, lumbar region: Secondary | ICD-10-CM | POA: Diagnosis not present

## 2022-09-14 DIAGNOSIS — M791 Myalgia, unspecified site: Secondary | ICD-10-CM | POA: Diagnosis not present

## 2022-09-15 ENCOUNTER — Other Ambulatory Visit: Payer: Self-pay | Admitting: Internal Medicine

## 2022-09-18 DIAGNOSIS — H43819 Vitreous degeneration, unspecified eye: Secondary | ICD-10-CM | POA: Diagnosis not present

## 2022-09-18 DIAGNOSIS — H353122 Nonexudative age-related macular degeneration, left eye, intermediate dry stage: Secondary | ICD-10-CM | POA: Diagnosis not present

## 2022-09-18 DIAGNOSIS — H353211 Exudative age-related macular degeneration, right eye, with active choroidal neovascularization: Secondary | ICD-10-CM | POA: Diagnosis not present

## 2022-09-18 DIAGNOSIS — H2513 Age-related nuclear cataract, bilateral: Secondary | ICD-10-CM | POA: Diagnosis not present

## 2022-09-29 ENCOUNTER — Encounter: Payer: Self-pay | Admitting: Internal Medicine

## 2022-09-29 NOTE — Progress Notes (Signed)
Outside notes received. Information abstracted. Notes sent to scan.  

## 2022-10-11 DIAGNOSIS — M5489 Other dorsalgia: Secondary | ICD-10-CM | POA: Diagnosis not present

## 2022-10-11 DIAGNOSIS — R2689 Other abnormalities of gait and mobility: Secondary | ICD-10-CM | POA: Diagnosis not present

## 2022-10-11 DIAGNOSIS — R293 Abnormal posture: Secondary | ICD-10-CM | POA: Diagnosis not present

## 2022-10-18 DIAGNOSIS — G4700 Insomnia, unspecified: Secondary | ICD-10-CM | POA: Diagnosis not present

## 2022-10-18 DIAGNOSIS — R7303 Prediabetes: Secondary | ICD-10-CM | POA: Diagnosis not present

## 2022-10-18 DIAGNOSIS — E559 Vitamin D deficiency, unspecified: Secondary | ICD-10-CM | POA: Diagnosis not present

## 2022-10-18 DIAGNOSIS — E039 Hypothyroidism, unspecified: Secondary | ICD-10-CM | POA: Diagnosis not present

## 2022-10-18 DIAGNOSIS — R635 Abnormal weight gain: Secondary | ICD-10-CM | POA: Diagnosis not present

## 2022-10-18 DIAGNOSIS — L989 Disorder of the skin and subcutaneous tissue, unspecified: Secondary | ICD-10-CM | POA: Diagnosis not present

## 2022-10-18 DIAGNOSIS — R198 Other specified symptoms and signs involving the digestive system and abdomen: Secondary | ICD-10-CM | POA: Diagnosis not present

## 2022-10-24 DIAGNOSIS — M5489 Other dorsalgia: Secondary | ICD-10-CM | POA: Diagnosis not present

## 2022-10-24 DIAGNOSIS — R293 Abnormal posture: Secondary | ICD-10-CM | POA: Diagnosis not present

## 2022-10-24 DIAGNOSIS — R2689 Other abnormalities of gait and mobility: Secondary | ICD-10-CM | POA: Diagnosis not present

## 2022-10-26 DIAGNOSIS — R7989 Other specified abnormal findings of blood chemistry: Secondary | ICD-10-CM | POA: Diagnosis not present

## 2022-10-26 DIAGNOSIS — R5383 Other fatigue: Secondary | ICD-10-CM | POA: Diagnosis not present

## 2022-10-26 DIAGNOSIS — N951 Menopausal and female climacteric states: Secondary | ICD-10-CM | POA: Diagnosis not present

## 2022-10-26 DIAGNOSIS — E039 Hypothyroidism, unspecified: Secondary | ICD-10-CM | POA: Diagnosis not present

## 2022-10-31 DIAGNOSIS — R300 Dysuria: Secondary | ICD-10-CM | POA: Diagnosis not present

## 2022-10-31 DIAGNOSIS — N760 Acute vaginitis: Secondary | ICD-10-CM | POA: Diagnosis not present

## 2022-10-31 DIAGNOSIS — B356 Tinea cruris: Secondary | ICD-10-CM | POA: Diagnosis not present

## 2022-11-01 DIAGNOSIS — Z1231 Encounter for screening mammogram for malignant neoplasm of breast: Secondary | ICD-10-CM | POA: Diagnosis not present

## 2022-11-01 LAB — HM MAMMOGRAPHY

## 2022-11-02 DIAGNOSIS — R293 Abnormal posture: Secondary | ICD-10-CM | POA: Diagnosis not present

## 2022-11-02 DIAGNOSIS — R2689 Other abnormalities of gait and mobility: Secondary | ICD-10-CM | POA: Diagnosis not present

## 2022-11-02 DIAGNOSIS — M5489 Other dorsalgia: Secondary | ICD-10-CM | POA: Diagnosis not present

## 2022-11-07 DIAGNOSIS — H353121 Nonexudative age-related macular degeneration, left eye, early dry stage: Secondary | ICD-10-CM | POA: Diagnosis not present

## 2022-11-09 DIAGNOSIS — M5489 Other dorsalgia: Secondary | ICD-10-CM | POA: Diagnosis not present

## 2022-11-09 DIAGNOSIS — R293 Abnormal posture: Secondary | ICD-10-CM | POA: Diagnosis not present

## 2022-11-09 DIAGNOSIS — R2689 Other abnormalities of gait and mobility: Secondary | ICD-10-CM | POA: Diagnosis not present

## 2022-12-04 ENCOUNTER — Other Ambulatory Visit: Payer: Self-pay | Admitting: Internal Medicine

## 2022-12-06 ENCOUNTER — Encounter: Payer: Self-pay | Admitting: Internal Medicine

## 2022-12-06 NOTE — Progress Notes (Signed)
Outside notes received. Information abstracted. Notes sent to scan.  

## 2022-12-12 ENCOUNTER — Ambulatory Visit (INDEPENDENT_AMBULATORY_CARE_PROVIDER_SITE_OTHER): Payer: Medicare Other

## 2022-12-12 VITALS — Ht 67.0 in | Wt 204.0 lb

## 2022-12-12 DIAGNOSIS — Z0000 Encounter for general adult medical examination without abnormal findings: Secondary | ICD-10-CM

## 2022-12-12 NOTE — Progress Notes (Signed)
Subjective:   Patricia Greene is a 67 y.o. female who presents for Medicare Annual (Subsequent) preventive examination.  I connected with  Patricia Greene on 12/12/22 by a audio enabled telemedicine application and verified that I am speaking with the correct person using two identifiers.  Patient Location: Home  Provider Location: Home Office  I discussed the limitations of evaluation and management by telemedicine. The patient expressed understanding and agreed to proceed.  Review of Systems     Cardiac Risk Factors include: advanced age (>16mn, >>75women)     Objective:    Today's Vitals   12/12/22 0955  Weight: 204 lb (92.5 kg)  Height: '5\' 7"'$  (1.702 m)   Body mass index is 31.95 kg/m.     12/12/2022    4:38 PM 12/01/2021    3:19 PM 11/23/2020   11:51 AM 08/23/2020    2:22 PM 09/27/2017   10:28 AM 06/19/2017   12:00 AM 06/15/2017    1:19 PM  Advanced Directives  Does Patient Have a Medical Advance Directive? No No No No No No No  Would patient like information on creating a medical advance directive? No - Patient declined No - Patient declined No - Patient declined No - Patient declined  No - Patient declined     Current Medications (verified) Outpatient Encounter Medications as of 12/12/2022  Medication Sig   baclofen (LIORESAL) 20 MG tablet Take 1 tablet (20 mg total) by mouth at bedtime as needed.   HYDROcodone bit-homatropine (HYCODAN) 5-1.5 MG/5ML syrup Take 5 mLs by mouth every 6 (six) hours as needed for cough.   Hyoscyamine Sulfate SL 0.125 MG SUBL 1-2 every 4 hours as needed for abdominal cramps   LORazepam (ATIVAN) 1 MG tablet Take 1 tablet by mouth twice daily as needed for anxiety   meloxicam (MOBIC) 15 MG tablet Take by mouth.   methocarbamol (ROBAXIN) 500 MG tablet TAKE 1 TABLET BY MOUTH EVERY 6 HOURS AS NEEDED FOR MUSCLE SPASM   thyroid (ARMOUR) 120 MG tablet Take 120 mg by mouth daily.    tobramycin (TOBREX) 0.3 % ophthalmic solution     [DISCONTINUED] sulfacetamide (BLEPH-10) 10 % ophthalmic solution 1 drop 4 (four) times daily.   FLUoxetine (PROZAC) 20 MG capsule Take 1 capsule by mouth once daily (Patient not taking: Reported on 12/12/2022)   [DISCONTINUED] ondansetron (ZOFRAN) 4 MG tablet Take 1 tablet (4 mg total) by mouth every 8 (eight) hours as needed for nausea or vomiting.   No facility-administered encounter medications on file as of 12/12/2022.    Allergies (verified) Cortisone and Silicone   History: Past Medical History:  Diagnosis Date   Allergic rhinitis    Allergy    Anxiety state, unspecified    Arthritis    left thumb   Carotid bruit    Cervicalgia    Diverticulosis    Fibromyalgia    better since breast implants were removed   GERD (gastroesophageal reflux disease)    Helicobacter pylori gastritis 10/02/2017   EGD and bx 12/.2018 Quad tx    History of migraine headaches    Hypothyroidism    nodular goiter   IBS (irritable bowel syndrome)    Insomnia    Obesity    Unspecified vitamin D deficiency    Past Surgical History:  Procedure Laterality Date   BREAST ENHANCEMENT SURGERY     breast implants removed     CHOLECYSTECTOMY N/A 06/19/2017   Procedure: LAPAROSCOPIC CHOLECYSTECTOMY WITH INTRAOPERATIVE CHOLANGIOGRAM,REPAIR OF DUODENAL  INJURY;  Surgeon: Kinsinger, Arta Bruce, MD;  Location: [facility];  Service: General;  Laterality: N/A;   COLONOSCOPY  1999, 2004 Sammuel Cooper), 08/09/2011   1999, 2004: diverticulosis   COLONOSCOPY  08/23/2020   Gessner   finger reattachment     THYROIDECTOMY  1973   right, toxic nodule   TONSILLECTOMY     Family History  Problem Relation Age of Onset   Asthma Mother    Breast cancer Mother    Allergic rhinitis Mother    Heart disease Mother    Colon cancer Father 51       died at 65   Colon polyps Sister    Esophageal cancer Neg Hx    Stomach cancer Neg Hx    Rectal cancer Neg Hx    Pancreatic cancer Neg Hx    Liver disease Neg Hx    Social History    Socioeconomic History   Marital status: Divorced    Spouse name: Not on file   Number of children: 0   Years of education: Not on file   Highest education level: Not on file  Occupational History   Occupation: RN  Tobacco Use   Smoking status: Never   Smokeless tobacco: Never  Vaping Use   Vaping Use: Never used  Substance and Sexual Activity   Alcohol use: No   Drug use: No   Sexual activity: Not on file  Other Topics Concern   Not on file  Social History Narrative   2-3 caffeine drinks daily    Divorced, no children. 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, now at school system   Social Determinants of Health   Financial Resource Strain: Low Risk  (12/12/2022)   Overall Financial Resource Strain (CARDIA)    Difficulty of Paying Living Expenses: Not hard at all  Food Insecurity: No Food Insecurity (12/12/2022)   Hunger Vital Sign    Worried About Running Out of Food in the Last Year: Never true    Ran Out of Food in the Last Year: Never true  Transportation Needs: No Transportation Needs (12/12/2022)   PRAPARE - Hydrologist (Medical): No    Lack of Transportation (Non-Medical): No  Physical Activity: Insufficiently Active (12/12/2022)   Exercise Vital Sign    Days of Exercise per Week: 2 days    Minutes of Exercise per Session: 30 min  Stress: No Stress Concern Present (12/12/2022)   [place]    Feeling of Stress : Not at all  Social Connections: Moderately Isolated (12/12/2022)   Social Connection and Isolation Panel [NHANES]    Frequency of Communication with Friends and Family: More than three times a week    Frequency of Social Gatherings with Friends and Family: Once a week    Attends Religious Services: Never    Marine scientist or Organizations: Yes    Attends Archivist Meetings: Never    Marital Status: Divorced    Tobacco  Counseling Counseling given: Not Answered   Clinical Intake:  Pre-visit preparation completed: Yes  Pain : No/denies pain  Diabetes: No  How often do you need to have someone help you when you read instructions, pamphlets, or other written materials from your doctor or pharmacy?: 1 - Never  Diabetic?No   Interpreter Needed?: No  Information entered by :: Denman George LPN   Activities of Daily Living    12/12/2022  4:38 PM  In your present state of health, do you have any difficulty performing the following activities:  Hearing? 0  Vision? 0  Difficulty concentrating or making decisions? 0  Walking or climbing stairs? 0  Dressing or bathing? 0  Doing errands, shopping? 0  Preparing Food and eating ? N  Using the Toilet? N  In the past six months, have you accidently leaked urine? N  Do you have problems with loss of bowel control? N  Managing your Medications? N  Managing your Finances? N  Housekeeping or managing your Housekeeping? N    Patient Care Team: Binnie Rail, MD as PCP - General (Internal Medicine) Gatha Mayer, MD as Consulting Physician (Gastroenterology) Meisinger, Sherren Mocha, MD as Consulting Physician (Obstetrics and Gynecology) Bo Merino, MD as Consulting Physician (Rheumatology) Reynold Bowen, MD as Consulting Physician (Endocrinology) Starling Manns, MD (Orthopedic Surgery) Sherlynn Stalls, MD as Consulting Physician (Ophthalmology)  Indicate any recent Medical Services you may have received from other than [facility] providers in the past year (date may be approximate).     Assessment:   This is a routine wellness examination for Ameli.  Hearing/Vision screen Hearing Screening - Comments:: Denies hearing difficulties  Vision Screening - Comments:: Wears rx glasses - up to date with routine eye exams   Dietary issues and exercise activities discussed: Current Exercise Habits: Home exercise routine, Type of exercise: walking, Time  (Minutes): 30, Frequency (Times/Week): 3, Weekly Exercise (Minutes/Week): 90, Intensity: Mild   Goals Addressed             This Visit's Progress    Patient Stated        Get back in the gym and get physically active again.      Depression Screen    12/12/2022    3:29 PM 05/25/2022   11:09 AM 05/11/2022    3:22 PM 12/01/2021    3:24 PM 11/15/2021    1:27 PM 11/23/2020   11:49 AM 03/19/2019    9:28 AM  PHQ 2/9 Scores  PHQ - 2 Score 0 0 0 0 1 0 0  PHQ- 9 Score  0     3    Fall Risk    12/12/2022    2:29 PM 05/25/2022   11:08 AM 05/11/2022    3:22 PM 12/01/2021    3:23 PM 11/15/2021    1:27 PM  Fall Risk   Falls in the past year? 0 0 0 1 1  Number falls in past yr: 0 0 0 0 0  Injury with Fall? 0 0 0 0 0  Risk for fall due to : No Fall Risks No Fall Risks No Fall Risks No Fall Risks No Fall Risks  Follow up Falls prevention discussed;Education provided;Falls evaluation completed Falls evaluation completed Falls evaluation completed Falls evaluation completed Falls evaluation completed    FALL RISK PREVENTION PERTAINING TO THE HOME:  Any stairs in or around the home? No  If so, are there any without handrails? No  Home free of loose throw rugs in walkways, pet beds, electrical cords, etc? Yes  Adequate lighting in your home to reduce risk of falls? Yes   ASSISTIVE DEVICES UTILIZED TO PREVENT FALLS:  Life alert? No  Use of a cane, walker or w/c? No  Grab bars in the bathroom? Yes  Shower chair or bench in shower? No  Elevated toilet seat or a handicapped toilet? Yes   TIMED UP AND GO:  Was the test performed? No .  Telephonic visit   Cognitive Function:        12/12/2022    4:38 PM  6CIT Screen  What Year? 0 points  What month? 0 points  What time? 0 points  Count back from 20 0 points  Months in reverse 0 points  Repeat phrase 0 points  Total Score 0 points    Immunizations Immunization History  Administered Date(s) Administered   Influenza,trivalent,  recombinat, inj, PF 06/30/2015   Tdap 12/28/2020    TDAP status: Up to date  Flu Vaccine status: Declined, Education has been provided regarding the importance of this vaccine but patient still declined. Advised may receive this vaccine at local pharmacy or Health Dept. Aware to provide a copy of the vaccination record if obtained from local pharmacy or Health Dept. Verbalized acceptance and understanding.  Pneumococcal vaccine status: Declined,  Education has been provided regarding the importance of this vaccine but patient still declined. Advised may receive this vaccine at local pharmacy or Health Dept. Aware to provide a copy of the vaccination record if obtained from local pharmacy or Health Dept. Verbalized acceptance and understanding.   Covid-19 vaccine status: Declined, Education has been provided regarding the importance of this vaccine but patient still declined. Advised may receive this vaccine at local pharmacy or Health Dept.or vaccine clinic. Aware to provide a copy of the vaccination record if obtained from local pharmacy or Health Dept. Verbalized acceptance and understanding.  Qualifies for Shingles Vaccine? Yes   Zostavax completed No   Shingrix Completed?: No.    Education has been provided regarding the importance of this vaccine. Patient has been advised to call insurance company to determine out of pocket expense if they have not yet received this vaccine. Advised may also receive vaccine at local pharmacy or Health Dept. Verbalized acceptance and understanding.  Screening Tests Health Maintenance  Topic Date Due   INFLUENZA VACCINE  12/31/2022 (Originally 05/02/2022)   Zoster Vaccines- Shingrix (1 of 2) 03/14/2023 (Originally [birth year]01/1973)   Pneumonia Vaccine 3+ Years old (1 of 1 - PCV) 05/12/2023 (Originally 03/02/2019)   Medicare Annual Wellness (AWV)  12/12/2023   DEXA SCAN  09/08/2024   MAMMOGRAM  11/01/2024   COLONOSCOPY (Pts 45-63yr Insurance coverage will need to  be confirmed)  08/23/2025   DTaP/Tdap/Td (2 - Td or Tdap) 12/29/2030   Hepatitis C Screening  Completed   HPV VACCINES  Aged Out   COVID-19 Vaccine  Discontinued    Health Maintenance  There are no preventive care reminders to display for this patient.   Colorectal cancer screening: Type of screening: Colonoscopy. Completed 08/23/20. Repeat every 5 years  Mammogram status: Completed 11/01/22. Repeat every year  Bone Density status: Completed 09/08/22. Results reflect: Bone density results: OSTEOPENIA. Repeat every 2 years.  Lung Cancer Screening: (Low Dose CT Chest recommended if Age 69-80years, 30 pack-year currently smoking OR have quit w/in 15years.) does not qualify.   Lung Cancer Screening Referral: n/a  Additional Screening:  Hepatitis C Screening: does qualify; Completed 05/13/14  Vision Screening: Recommended annual ophthalmology exams for early detection of glaucoma and other disorders of the eye. Is the patient up to date with their annual eye exam?  Yes  Who is the provider or what is the name of the office in which the patient attends annual eye exams? Unable to provide name  If pt is not established with a provider, would they like to be referred to a provider to establish care? No .   Dental  Screening: Recommended annual dental exams for proper oral hygiene  Community Resource Referral / Chronic Care Management: CRR required this visit?  No   CCM required this visit?  No      Plan:     I have personally reviewed and noted the following in the patient's chart:   Medical and social history Use of alcohol, tobacco or illicit drugs  Current medications and supplements including opioid prescriptions. Patient is not currently taking opioid prescriptions. Functional ability and status Nutritional status Physical activity Advanced directives List of other physicians Hospitalizations, surgeries, and ER visits in previous 12 months Vitals Screenings to include  cognitive, depression, and falls Referrals and appointments  In addition, I have reviewed and discussed with patient certain preventive protocols, quality metrics, and best practice recommendations. A written personalized care plan for preventive services as well as general preventive health recommendations were provided to patient.     Vanetta Mulders, [place]   [id number]   Due to this being a virtual visit, the after visit summary with patients personalized plan was offered to patient via mail or my-chart. Patient would like to access on my-chart  Nurse Notes: No concerns

## 2022-12-12 NOTE — Patient Instructions (Addendum)
Patricia Greene , Thank you for taking time to come for your Medicare Wellness Visit. I appreciate your ongoing commitment to your health goals. Please review the following plan we discussed and let me know if I can assist you in the future.   These are the goals we discussed:  Goals      Patient Stated      Get back in the gym and get physically active again.        This is a list of the screening recommended for you and due dates:  Health Maintenance  Topic Date Due   Flu Shot  12/31/2022*   Zoster (Shingles) Vaccine (1 of 2) 03/14/2023*   Pneumonia Vaccine (1 of 1 - PCV) 05/12/2023*   Medicare Annual Wellness Visit  12/12/2023   DEXA scan (bone density measurement)  09/08/2024   Mammogram  11/01/2024   Colon Cancer Screening  08/23/2025   DTaP/Tdap/Td vaccine (2 - Td or Tdap) 12/29/2030   Hepatitis C Screening: USPSTF Recommendation to screen - Ages 62-79 yo.  Completed   HPV Vaccine  Aged Out   COVID-19 Vaccine  Discontinued  *Topic was postponed. The date shown is not the original due date.    Advanced directives: Please bring a copy of your health care power of attorney and living will to the office to be added to your chart at your convenience.   Conditions/risks identified: Aim for 30 minutes of exercise or brisk walking, 6-8 glasses of water, and 5 servings of fruits and vegetables each day.   Next appointment: Follow up in one year for your annual wellness visit    Preventive Care 65 Years and Older, Female Preventive care refers to lifestyle choices and visits with your health care provider that can promote health and wellness. What does preventive care include? A yearly physical exam. This is also called an annual well check. Dental exams once or twice a year. Routine eye exams. Ask your health care provider how often you should have your eyes checked. Personal lifestyle choices, including: Daily care of your teeth and gums. Regular physical activity. Eating a  healthy diet. Avoiding tobacco and drug use. Limiting alcohol use. Practicing safe sex. Taking low-dose aspirin every day. Taking vitamin and mineral supplements as recommended by your health care provider. What happens during an annual well check? The services and screenings done by your health care provider during your annual well check will depend on your age, overall health, lifestyle risk factors, and family history of disease. Counseling  Your health care provider may ask you questions about your: Alcohol use. Tobacco use. Drug use. Emotional well-being. Home and relationship well-being. Sexual activity. Eating habits. History of falls. Memory and ability to understand (cognition). Work and work Statistician. Reproductive health. Screening  You may have the following tests or measurements: Height, weight, and BMI. Blood pressure. Lipid and cholesterol levels. These may be checked every 5 years, or more frequently if you are over 31 years old. Skin check. Lung cancer screening. You may have this screening every year starting at age 26 if you have a 30-pack-year history of smoking and currently smoke or have quit within the past 15 years. Fecal occult blood test (FOBT) of the stool. You may have this test every year starting at age 36. Flexible sigmoidoscopy or colonoscopy. You may have a sigmoidoscopy every 5 years or a colonoscopy every 10 years starting at age 71. Hepatitis C blood test. Hepatitis B blood test. Sexually transmitted disease (STD) testing.  Diabetes screening. This is done by checking your blood sugar (glucose) after you have not eaten for a while (fasting). You may have this done every 1-3 years. Bone density scan. This is done to screen for osteoporosis. You may have this done starting at age 83. Mammogram. This may be done every 1-2 years. Talk to your health care provider about how often you should have regular mammograms. Talk with your health care  provider about your test results, treatment options, and if necessary, the need for more tests. Vaccines  Your health care provider may recommend certain vaccines, such as: Influenza vaccine. This is recommended every year. Tetanus, diphtheria, and acellular pertussis (Tdap, Td) vaccine. You may need a Td booster every 10 years. Zoster vaccine. You may need this after age 38. Pneumococcal 13-valent conjugate (PCV13) vaccine. One dose is recommended after age 71. Pneumococcal polysaccharide (PPSV23) vaccine. One dose is recommended after age 13. Talk to your health care provider about which screenings and vaccines you need and how often you need them. This information is not intended to replace advice given to you by your health care provider. Make sure you discuss any questions you have with your health care provider. Document Released: 10/15/2015 Document Revised: 06/07/2016 Document Reviewed: 07/20/2015 Elsevier Interactive Patient Education  2017 [employer] Prevention in the Home Falls can cause injuries. They can happen to people of all ages. There are many things you can do to make your home safe and to help prevent falls. What can I do on the outside of my home? Regularly fix the edges of walkways and driveways and fix any cracks. Remove anything that might make you trip as you walk through a door, such as a raised step or threshold. Trim any bushes or trees on the path to your home. Use bright outdoor lighting. Clear any walking paths of anything that might make someone trip, such as rocks or tools. Regularly check to see if handrails are loose or broken. Make sure that both sides of any steps have handrails. Any raised decks and porches should have guardrails on the edges. Have any leaves, snow, or ice cleared regularly. Use sand or salt on walking paths during winter. Clean up any spills in your garage right away. This includes oil or grease spills. What can I do in the  bathroom? Use night lights. Install grab bars by the toilet and in the tub and shower. Do not use towel bars as grab bars. Use non-skid mats or decals in the tub or shower. If you need to sit down in the shower, use a plastic, non-slip stool. Keep the floor dry. Clean up any water that spills on the floor as soon as it happens. Remove soap buildup in the tub or shower regularly. Attach bath mats securely with double-sided non-slip rug tape. Do not have throw rugs and other things on the floor that can make you trip. What can I do in the bedroom? Use night lights. Make sure that you have a light by your bed that is easy to reach. Do not use any sheets or blankets that are too big for your bed. They should not hang down onto the floor. Have a firm chair that has side arms. You can use this for support while you get dressed. Do not have throw rugs and other things on the floor that can make you trip. What can I do in the kitchen? Clean up any spills right away. Avoid walking on wet floors.  Keep items that you use a lot in easy-to-reach places. If you need to reach something above you, use a strong step stool that has a grab bar. Keep electrical cords out of the way. Do not use floor polish or wax that makes floors slippery. If you must use wax, use non-skid floor wax. Do not have throw rugs and other things on the floor that can make you trip. What can I do with my stairs? Do not leave any items on the stairs. Make sure that there are handrails on both sides of the stairs and use them. Fix handrails that are broken or loose. Make sure that handrails are as long as the stairways. Check any carpeting to make sure that it is firmly attached to the stairs. Fix any carpet that is loose or worn. Avoid having throw rugs at the top or bottom of the stairs. If you do have throw rugs, attach them to the floor with carpet tape. Make sure that you have a light switch at the top of the stairs and the  bottom of the stairs. If you do not have them, ask someone to add them for you. What else can I do to help prevent falls? Wear shoes that: Do not have high heels. Have rubber bottoms. Are comfortable and fit you well. Are closed at the toe. Do not wear sandals. If you use a stepladder: Make sure that it is fully opened. Do not climb a closed stepladder. Make sure that both sides of the stepladder are locked into place. Ask someone to hold it for you, if possible. Clearly mark and make sure that you can see: Any grab bars or handrails. First and last steps. Where the edge of each step is. Use tools that help you move around (mobility aids) if they are needed. These include: Canes. Walkers. Scooters. Crutches. Turn on the lights when you go into a dark area. Replace any light bulbs as soon as they burn out. Set up your furniture so you have a clear path. Avoid moving your furniture around. If any of your floors are uneven, fix them. If there are any pets around you, be aware of where they are. Review your medicines with your doctor. Some medicines can make you feel dizzy. This can increase your chance of falling. Ask your doctor what other things that you can do to help prevent falls. This information is not intended to replace advice given to you by your health care provider. Make sure you discuss any questions you have with your health care provider. Document Released: 07/15/2009 Document Revised: 02/24/2016 Document Reviewed: 10/23/2014 Elsevier Interactive Patient Education  2017 [employer].

## 2023-01-02 DIAGNOSIS — H2513 Age-related nuclear cataract, bilateral: Secondary | ICD-10-CM | POA: Diagnosis not present

## 2023-01-02 DIAGNOSIS — H353121 Nonexudative age-related macular degeneration, left eye, early dry stage: Secondary | ICD-10-CM | POA: Diagnosis not present

## 2023-01-02 DIAGNOSIS — H353211 Exudative age-related macular degeneration, right eye, with active choroidal neovascularization: Secondary | ICD-10-CM | POA: Diagnosis not present

## 2023-01-02 DIAGNOSIS — H43813 Vitreous degeneration, bilateral: Secondary | ICD-10-CM | POA: Diagnosis not present

## 2023-01-17 DIAGNOSIS — Z87898 Personal history of other specified conditions: Secondary | ICD-10-CM | POA: Diagnosis not present

## 2023-01-17 DIAGNOSIS — D225 Melanocytic nevi of trunk: Secondary | ICD-10-CM | POA: Diagnosis not present

## 2023-01-17 DIAGNOSIS — D485 Neoplasm of uncertain behavior of skin: Secondary | ICD-10-CM | POA: Diagnosis not present

## 2023-01-17 DIAGNOSIS — L570 Actinic keratosis: Secondary | ICD-10-CM | POA: Diagnosis not present

## 2023-01-17 DIAGNOSIS — D2270 Melanocytic nevi of unspecified lower limb, including hip: Secondary | ICD-10-CM | POA: Diagnosis not present

## 2023-01-17 DIAGNOSIS — D239 Other benign neoplasm of skin, unspecified: Secondary | ICD-10-CM | POA: Diagnosis not present

## 2023-01-17 DIAGNOSIS — L814 Other melanin hyperpigmentation: Secondary | ICD-10-CM | POA: Diagnosis not present

## 2023-01-17 DIAGNOSIS — D1801 Hemangioma of skin and subcutaneous tissue: Secondary | ICD-10-CM | POA: Diagnosis not present

## 2023-01-17 DIAGNOSIS — L821 Other seborrheic keratosis: Secondary | ICD-10-CM | POA: Diagnosis not present

## 2023-01-17 DIAGNOSIS — D2260 Melanocytic nevi of unspecified upper limb, including shoulder: Secondary | ICD-10-CM | POA: Diagnosis not present

## 2023-01-19 DIAGNOSIS — S93492A Sprain of other ligament of left ankle, initial encounter: Secondary | ICD-10-CM | POA: Diagnosis not present

## 2023-01-19 DIAGNOSIS — S8012XA Contusion of left lower leg, initial encounter: Secondary | ICD-10-CM | POA: Diagnosis not present

## 2023-01-19 DIAGNOSIS — M25562 Pain in left knee: Secondary | ICD-10-CM | POA: Diagnosis not present

## 2023-01-19 DIAGNOSIS — Y9373 Activity, racquet and hand sports: Secondary | ICD-10-CM | POA: Diagnosis not present

## 2023-01-19 DIAGNOSIS — M1712 Unilateral primary osteoarthritis, left knee: Secondary | ICD-10-CM | POA: Diagnosis not present

## 2023-01-19 DIAGNOSIS — S8002XA Contusion of left knee, initial encounter: Secondary | ICD-10-CM | POA: Diagnosis not present

## 2023-01-19 DIAGNOSIS — S76312A Strain of muscle, fascia and tendon of the posterior muscle group at thigh level, left thigh, initial encounter: Secondary | ICD-10-CM | POA: Diagnosis not present

## 2023-01-19 DIAGNOSIS — M25572 Pain in left ankle and joints of left foot: Secondary | ICD-10-CM | POA: Diagnosis not present

## 2023-01-19 DIAGNOSIS — M7732 Calcaneal spur, left foot: Secondary | ICD-10-CM | POA: Diagnosis not present

## 2023-01-24 DIAGNOSIS — R2689 Other abnormalities of gait and mobility: Secondary | ICD-10-CM | POA: Diagnosis not present

## 2023-01-24 DIAGNOSIS — M5489 Other dorsalgia: Secondary | ICD-10-CM | POA: Diagnosis not present

## 2023-01-24 DIAGNOSIS — R293 Abnormal posture: Secondary | ICD-10-CM | POA: Diagnosis not present

## 2023-01-30 DIAGNOSIS — R293 Abnormal posture: Secondary | ICD-10-CM | POA: Diagnosis not present

## 2023-01-30 DIAGNOSIS — K589 Irritable bowel syndrome without diarrhea: Secondary | ICD-10-CM | POA: Diagnosis not present

## 2023-01-30 DIAGNOSIS — N951 Menopausal and female climacteric states: Secondary | ICD-10-CM | POA: Diagnosis not present

## 2023-01-30 DIAGNOSIS — E6609 Other obesity due to excess calories: Secondary | ICD-10-CM | POA: Diagnosis not present

## 2023-01-30 DIAGNOSIS — M5489 Other dorsalgia: Secondary | ICD-10-CM | POA: Diagnosis not present

## 2023-01-30 DIAGNOSIS — S8002XA Contusion of left knee, initial encounter: Secondary | ICD-10-CM | POA: Diagnosis not present

## 2023-01-30 DIAGNOSIS — R2689 Other abnormalities of gait and mobility: Secondary | ICD-10-CM | POA: Diagnosis not present

## 2023-01-30 DIAGNOSIS — L659 Nonscarring hair loss, unspecified: Secondary | ICD-10-CM | POA: Diagnosis not present

## 2023-01-30 DIAGNOSIS — B9681 Helicobacter pylori [H. pylori] as the cause of diseases classified elsewhere: Secondary | ICD-10-CM | POA: Diagnosis not present

## 2023-01-30 DIAGNOSIS — M2550 Pain in unspecified joint: Secondary | ICD-10-CM | POA: Diagnosis not present

## 2023-01-30 DIAGNOSIS — Z6834 Body mass index (BMI) 34.0-34.9, adult: Secondary | ICD-10-CM | POA: Diagnosis not present

## 2023-01-30 DIAGNOSIS — M797 Fibromyalgia: Secondary | ICD-10-CM | POA: Diagnosis not present

## 2023-01-30 DIAGNOSIS — E890 Postprocedural hypothyroidism: Secondary | ICD-10-CM | POA: Diagnosis not present

## 2023-02-01 DIAGNOSIS — R293 Abnormal posture: Secondary | ICD-10-CM | POA: Diagnosis not present

## 2023-02-01 DIAGNOSIS — M5489 Other dorsalgia: Secondary | ICD-10-CM | POA: Diagnosis not present

## 2023-02-01 DIAGNOSIS — R2689 Other abnormalities of gait and mobility: Secondary | ICD-10-CM | POA: Diagnosis not present

## 2023-02-03 ENCOUNTER — Other Ambulatory Visit: Payer: Self-pay | Admitting: Internal Medicine

## 2023-02-05 DIAGNOSIS — R2689 Other abnormalities of gait and mobility: Secondary | ICD-10-CM | POA: Diagnosis not present

## 2023-02-05 DIAGNOSIS — M5489 Other dorsalgia: Secondary | ICD-10-CM | POA: Diagnosis not present

## 2023-02-05 DIAGNOSIS — R293 Abnormal posture: Secondary | ICD-10-CM | POA: Diagnosis not present

## 2023-02-08 DIAGNOSIS — M5489 Other dorsalgia: Secondary | ICD-10-CM | POA: Diagnosis not present

## 2023-02-08 DIAGNOSIS — R293 Abnormal posture: Secondary | ICD-10-CM | POA: Diagnosis not present

## 2023-02-08 DIAGNOSIS — R2689 Other abnormalities of gait and mobility: Secondary | ICD-10-CM | POA: Diagnosis not present

## 2023-02-12 DIAGNOSIS — R293 Abnormal posture: Secondary | ICD-10-CM | POA: Diagnosis not present

## 2023-02-12 DIAGNOSIS — R2689 Other abnormalities of gait and mobility: Secondary | ICD-10-CM | POA: Diagnosis not present

## 2023-02-12 DIAGNOSIS — M5489 Other dorsalgia: Secondary | ICD-10-CM | POA: Diagnosis not present

## 2023-02-15 DIAGNOSIS — S8002XA Contusion of left knee, initial encounter: Secondary | ICD-10-CM | POA: Diagnosis not present

## 2023-02-16 DIAGNOSIS — R293 Abnormal posture: Secondary | ICD-10-CM | POA: Diagnosis not present

## 2023-02-16 DIAGNOSIS — M5489 Other dorsalgia: Secondary | ICD-10-CM | POA: Diagnosis not present

## 2023-02-16 DIAGNOSIS — R2689 Other abnormalities of gait and mobility: Secondary | ICD-10-CM | POA: Diagnosis not present

## 2023-02-21 DIAGNOSIS — T148XXA Other injury of unspecified body region, initial encounter: Secondary | ICD-10-CM | POA: Diagnosis not present

## 2023-02-21 DIAGNOSIS — X58XXXA Exposure to other specified factors, initial encounter: Secondary | ICD-10-CM | POA: Diagnosis not present

## 2023-02-21 DIAGNOSIS — R293 Abnormal posture: Secondary | ICD-10-CM | POA: Diagnosis not present

## 2023-02-21 DIAGNOSIS — M5489 Other dorsalgia: Secondary | ICD-10-CM | POA: Diagnosis not present

## 2023-02-21 DIAGNOSIS — R2689 Other abnormalities of gait and mobility: Secondary | ICD-10-CM | POA: Diagnosis not present

## 2023-02-27 DIAGNOSIS — H2513 Age-related nuclear cataract, bilateral: Secondary | ICD-10-CM | POA: Diagnosis not present

## 2023-02-27 DIAGNOSIS — H353121 Nonexudative age-related macular degeneration, left eye, early dry stage: Secondary | ICD-10-CM | POA: Diagnosis not present

## 2023-02-27 DIAGNOSIS — H353211 Exudative age-related macular degeneration, right eye, with active choroidal neovascularization: Secondary | ICD-10-CM | POA: Diagnosis not present

## 2023-02-27 DIAGNOSIS — H43813 Vitreous degeneration, bilateral: Secondary | ICD-10-CM | POA: Diagnosis not present

## 2023-02-28 DIAGNOSIS — M5489 Other dorsalgia: Secondary | ICD-10-CM | POA: Diagnosis not present

## 2023-02-28 DIAGNOSIS — R293 Abnormal posture: Secondary | ICD-10-CM | POA: Diagnosis not present

## 2023-02-28 DIAGNOSIS — R2689 Other abnormalities of gait and mobility: Secondary | ICD-10-CM | POA: Diagnosis not present

## 2023-03-13 DIAGNOSIS — M5489 Other dorsalgia: Secondary | ICD-10-CM | POA: Diagnosis not present

## 2023-03-13 DIAGNOSIS — R2689 Other abnormalities of gait and mobility: Secondary | ICD-10-CM | POA: Diagnosis not present

## 2023-03-13 DIAGNOSIS — R293 Abnormal posture: Secondary | ICD-10-CM | POA: Diagnosis not present

## 2023-03-15 DIAGNOSIS — M25562 Pain in left knee: Secondary | ICD-10-CM | POA: Diagnosis not present

## 2023-03-16 DIAGNOSIS — E890 Postprocedural hypothyroidism: Secondary | ICD-10-CM | POA: Diagnosis not present

## 2023-03-20 DIAGNOSIS — R293 Abnormal posture: Secondary | ICD-10-CM | POA: Diagnosis not present

## 2023-03-20 DIAGNOSIS — R2689 Other abnormalities of gait and mobility: Secondary | ICD-10-CM | POA: Diagnosis not present

## 2023-03-20 DIAGNOSIS — M5489 Other dorsalgia: Secondary | ICD-10-CM | POA: Diagnosis not present

## 2023-03-30 DIAGNOSIS — M5489 Other dorsalgia: Secondary | ICD-10-CM | POA: Diagnosis not present

## 2023-03-30 DIAGNOSIS — R2689 Other abnormalities of gait and mobility: Secondary | ICD-10-CM | POA: Diagnosis not present

## 2023-03-30 DIAGNOSIS — R293 Abnormal posture: Secondary | ICD-10-CM | POA: Diagnosis not present

## 2023-03-30 DIAGNOSIS — M25562 Pain in left knee: Secondary | ICD-10-CM | POA: Diagnosis not present

## 2023-04-02 DIAGNOSIS — R2689 Other abnormalities of gait and mobility: Secondary | ICD-10-CM | POA: Diagnosis not present

## 2023-04-02 DIAGNOSIS — M5489 Other dorsalgia: Secondary | ICD-10-CM | POA: Diagnosis not present

## 2023-04-02 DIAGNOSIS — R293 Abnormal posture: Secondary | ICD-10-CM | POA: Diagnosis not present

## 2023-04-09 DIAGNOSIS — R293 Abnormal posture: Secondary | ICD-10-CM | POA: Diagnosis not present

## 2023-04-09 DIAGNOSIS — R2689 Other abnormalities of gait and mobility: Secondary | ICD-10-CM | POA: Diagnosis not present

## 2023-04-09 DIAGNOSIS — M25562 Pain in left knee: Secondary | ICD-10-CM | POA: Diagnosis not present

## 2023-04-09 DIAGNOSIS — M5489 Other dorsalgia: Secondary | ICD-10-CM | POA: Diagnosis not present

## 2023-04-12 DIAGNOSIS — R293 Abnormal posture: Secondary | ICD-10-CM | POA: Diagnosis not present

## 2023-04-12 DIAGNOSIS — R2689 Other abnormalities of gait and mobility: Secondary | ICD-10-CM | POA: Diagnosis not present

## 2023-04-12 DIAGNOSIS — M6281 Muscle weakness (generalized): Secondary | ICD-10-CM | POA: Diagnosis not present

## 2023-04-12 DIAGNOSIS — M5489 Other dorsalgia: Secondary | ICD-10-CM | POA: Diagnosis not present

## 2023-04-12 DIAGNOSIS — M25562 Pain in left knee: Secondary | ICD-10-CM | POA: Diagnosis not present

## 2023-04-16 DIAGNOSIS — R2689 Other abnormalities of gait and mobility: Secondary | ICD-10-CM | POA: Diagnosis not present

## 2023-04-16 DIAGNOSIS — M25562 Pain in left knee: Secondary | ICD-10-CM | POA: Diagnosis not present

## 2023-04-16 DIAGNOSIS — M5489 Other dorsalgia: Secondary | ICD-10-CM | POA: Diagnosis not present

## 2023-04-16 DIAGNOSIS — R293 Abnormal posture: Secondary | ICD-10-CM | POA: Diagnosis not present

## 2023-04-16 DIAGNOSIS — M6281 Muscle weakness (generalized): Secondary | ICD-10-CM | POA: Diagnosis not present

## 2023-04-19 DIAGNOSIS — M5489 Other dorsalgia: Secondary | ICD-10-CM | POA: Diagnosis not present

## 2023-04-19 DIAGNOSIS — R293 Abnormal posture: Secondary | ICD-10-CM | POA: Diagnosis not present

## 2023-04-19 DIAGNOSIS — R2689 Other abnormalities of gait and mobility: Secondary | ICD-10-CM | POA: Diagnosis not present

## 2023-04-19 DIAGNOSIS — M6281 Muscle weakness (generalized): Secondary | ICD-10-CM | POA: Diagnosis not present

## 2023-04-19 DIAGNOSIS — M25562 Pain in left knee: Secondary | ICD-10-CM | POA: Diagnosis not present

## 2023-04-23 DIAGNOSIS — M5489 Other dorsalgia: Secondary | ICD-10-CM | POA: Diagnosis not present

## 2023-04-23 DIAGNOSIS — M25562 Pain in left knee: Secondary | ICD-10-CM | POA: Diagnosis not present

## 2023-04-23 DIAGNOSIS — M6281 Muscle weakness (generalized): Secondary | ICD-10-CM | POA: Diagnosis not present

## 2023-04-23 DIAGNOSIS — R2689 Other abnormalities of gait and mobility: Secondary | ICD-10-CM | POA: Diagnosis not present

## 2023-04-23 DIAGNOSIS — R293 Abnormal posture: Secondary | ICD-10-CM | POA: Diagnosis not present

## 2023-04-25 DIAGNOSIS — R2689 Other abnormalities of gait and mobility: Secondary | ICD-10-CM | POA: Diagnosis not present

## 2023-04-25 DIAGNOSIS — M5489 Other dorsalgia: Secondary | ICD-10-CM | POA: Diagnosis not present

## 2023-04-25 DIAGNOSIS — M25562 Pain in left knee: Secondary | ICD-10-CM | POA: Diagnosis not present

## 2023-04-25 DIAGNOSIS — M6281 Muscle weakness (generalized): Secondary | ICD-10-CM | POA: Diagnosis not present

## 2023-04-25 DIAGNOSIS — R293 Abnormal posture: Secondary | ICD-10-CM | POA: Diagnosis not present

## 2023-04-30 DIAGNOSIS — E890 Postprocedural hypothyroidism: Secondary | ICD-10-CM | POA: Diagnosis not present

## 2023-04-30 DIAGNOSIS — M6281 Muscle weakness (generalized): Secondary | ICD-10-CM | POA: Diagnosis not present

## 2023-04-30 DIAGNOSIS — R2689 Other abnormalities of gait and mobility: Secondary | ICD-10-CM | POA: Diagnosis not present

## 2023-04-30 DIAGNOSIS — R293 Abnormal posture: Secondary | ICD-10-CM | POA: Diagnosis not present

## 2023-04-30 DIAGNOSIS — M25562 Pain in left knee: Secondary | ICD-10-CM | POA: Diagnosis not present

## 2023-04-30 DIAGNOSIS — M5489 Other dorsalgia: Secondary | ICD-10-CM | POA: Diagnosis not present

## 2023-05-01 DIAGNOSIS — H353211 Exudative age-related macular degeneration, right eye, with active choroidal neovascularization: Secondary | ICD-10-CM | POA: Diagnosis not present

## 2023-05-01 DIAGNOSIS — H2513 Age-related nuclear cataract, bilateral: Secondary | ICD-10-CM | POA: Diagnosis not present

## 2023-05-01 DIAGNOSIS — H353121 Nonexudative age-related macular degeneration, left eye, early dry stage: Secondary | ICD-10-CM | POA: Diagnosis not present

## 2023-05-01 DIAGNOSIS — H43813 Vitreous degeneration, bilateral: Secondary | ICD-10-CM | POA: Diagnosis not present

## 2023-05-02 ENCOUNTER — Encounter (INDEPENDENT_AMBULATORY_CARE_PROVIDER_SITE_OTHER): Payer: Self-pay

## 2023-05-02 DIAGNOSIS — M2550 Pain in unspecified joint: Secondary | ICD-10-CM | POA: Diagnosis not present

## 2023-05-02 DIAGNOSIS — K589 Irritable bowel syndrome without diarrhea: Secondary | ICD-10-CM | POA: Diagnosis not present

## 2023-05-02 DIAGNOSIS — N951 Menopausal and female climacteric states: Secondary | ICD-10-CM | POA: Diagnosis not present

## 2023-05-02 DIAGNOSIS — Z6835 Body mass index (BMI) 35.0-35.9, adult: Secondary | ICD-10-CM | POA: Diagnosis not present

## 2023-05-02 DIAGNOSIS — L659 Nonscarring hair loss, unspecified: Secondary | ICD-10-CM | POA: Diagnosis not present

## 2023-05-02 DIAGNOSIS — M797 Fibromyalgia: Secondary | ICD-10-CM | POA: Diagnosis not present

## 2023-05-02 DIAGNOSIS — E6609 Other obesity due to excess calories: Secondary | ICD-10-CM | POA: Diagnosis not present

## 2023-05-02 DIAGNOSIS — E890 Postprocedural hypothyroidism: Secondary | ICD-10-CM | POA: Diagnosis not present

## 2023-05-07 DIAGNOSIS — M25562 Pain in left knee: Secondary | ICD-10-CM | POA: Diagnosis not present

## 2023-05-09 DIAGNOSIS — R2689 Other abnormalities of gait and mobility: Secondary | ICD-10-CM | POA: Diagnosis not present

## 2023-05-09 DIAGNOSIS — M6281 Muscle weakness (generalized): Secondary | ICD-10-CM | POA: Diagnosis not present

## 2023-05-09 DIAGNOSIS — M25562 Pain in left knee: Secondary | ICD-10-CM | POA: Diagnosis not present

## 2023-05-09 DIAGNOSIS — R293 Abnormal posture: Secondary | ICD-10-CM | POA: Diagnosis not present

## 2023-05-09 DIAGNOSIS — M5489 Other dorsalgia: Secondary | ICD-10-CM | POA: Diagnosis not present

## 2023-05-11 DIAGNOSIS — R2689 Other abnormalities of gait and mobility: Secondary | ICD-10-CM | POA: Diagnosis not present

## 2023-05-11 DIAGNOSIS — M25562 Pain in left knee: Secondary | ICD-10-CM | POA: Diagnosis not present

## 2023-05-11 DIAGNOSIS — M5489 Other dorsalgia: Secondary | ICD-10-CM | POA: Diagnosis not present

## 2023-05-11 DIAGNOSIS — M6281 Muscle weakness (generalized): Secondary | ICD-10-CM | POA: Diagnosis not present

## 2023-05-11 DIAGNOSIS — R293 Abnormal posture: Secondary | ICD-10-CM | POA: Diagnosis not present

## 2023-05-14 ENCOUNTER — Telehealth: Payer: Self-pay | Admitting: Internal Medicine

## 2023-05-14 NOTE — Telephone Encounter (Signed)
Patient was in DEXA work queue to be scheduled. Spoke with the patient and she said she usually has them done at the [facility]. She said she would contact them to find out when her last one was.

## 2023-05-16 DIAGNOSIS — M5489 Other dorsalgia: Secondary | ICD-10-CM | POA: Diagnosis not present

## 2023-05-16 DIAGNOSIS — R293 Abnormal posture: Secondary | ICD-10-CM | POA: Diagnosis not present

## 2023-05-16 DIAGNOSIS — R2689 Other abnormalities of gait and mobility: Secondary | ICD-10-CM | POA: Diagnosis not present

## 2023-05-16 DIAGNOSIS — M25562 Pain in left knee: Secondary | ICD-10-CM | POA: Diagnosis not present

## 2023-05-16 DIAGNOSIS — M6281 Muscle weakness (generalized): Secondary | ICD-10-CM | POA: Diagnosis not present

## 2023-05-25 DIAGNOSIS — M25562 Pain in left knee: Secondary | ICD-10-CM | POA: Diagnosis not present

## 2023-05-25 DIAGNOSIS — R293 Abnormal posture: Secondary | ICD-10-CM | POA: Diagnosis not present

## 2023-05-25 DIAGNOSIS — R2689 Other abnormalities of gait and mobility: Secondary | ICD-10-CM | POA: Diagnosis not present

## 2023-05-25 DIAGNOSIS — M5489 Other dorsalgia: Secondary | ICD-10-CM | POA: Diagnosis not present

## 2023-05-25 DIAGNOSIS — M6281 Muscle weakness (generalized): Secondary | ICD-10-CM | POA: Diagnosis not present

## 2023-05-30 ENCOUNTER — Encounter: Payer: Self-pay | Admitting: Internal Medicine

## 2023-05-30 NOTE — Progress Notes (Signed)
Subjective:    Patient ID: Patricia Greene, female    DOB: 1956-08-07, 67 y.o.   MRN: [id number]      HPI Aldean is here for follow up of her chronic medical problems.   Left knee pain - started playing racquetball - has meniscal tear, acl strain, edema.    Sciatica - intermittent flares.     Doing PT.     Medications and allergies reviewed with patient and updated if appropriate.  Current Outpatient Medications on File Prior to Visit  Medication Sig Dispense Refill   baclofen (LIORESAL) 20 MG tablet Take 1 tablet (20 mg total) by mouth at bedtime as needed. 90 tablet 1   Hyoscyamine Sulfate SL 0.125 MG SUBL 1-2 every 4 hours as needed for abdominal cramps 60 tablet 1   LORazepam (ATIVAN) 1 MG tablet Take 1 tablet by mouth twice daily as needed for anxiety 60 tablet 1   meloxicam (MOBIC) 15 MG tablet Take by mouth.     methocarbamol (ROBAXIN) 500 MG tablet TAKE 1 TABLET BY MOUTH EVERY 6 HOURS AS NEEDED FOR MUSCLE SPASM 90 tablet 1   thyroid (ARMOUR) 120 MG tablet Take 120 mg by mouth daily.      tobramycin (TOBREX) 0.3 % ophthalmic solution      No current facility-administered medications on file prior to visit.    Review of Systems  Constitutional:  Negative for fever.  Respiratory:  Negative for cough, shortness of breath and wheezing.   Cardiovascular:  Positive for leg swelling. Negative for chest pain and palpitations.  Neurological:  Negative for light-headedness and headaches.  Psychiatric/Behavioral:  Negative for dysphoric mood and sleep disturbance. The patient is not nervous/anxious.        Objective:   Vitals:   05/31/23 1056  BP: 124/76  Pulse: 85  Temp: 98.3 F (36.8 C)  SpO2: 95%   Filed Weights   05/31/23 1056  Weight: 222 lb 3.2 oz (100.8 kg)   Body mass index is 34.8 kg/m.  BP Readings from Last 3 Encounters:  05/31/23 124/76  05/25/22 120/78  05/11/22 132/70    Wt Readings from Last 3 Encounters:  05/31/23 222 lb 3.2 oz (100.8  kg)  12/12/22 204 lb (92.5 kg)  05/25/22 201 lb (91.2 kg)       Physical Exam Constitutional: She appears well-developed and well-nourished. No distress.  HENT:  Head: Normocephalic and atraumatic.  Eyes: Conjunctivae normal.  Neck: Neck supple. No tracheal deviation present. No thyromegaly present.  No carotid bruit  Cardiovascular: Normal rate, regular rhythm and normal heart sounds.   No murmur heard.  No edema. Pulmonary/Chest: Effort normal and breath sounds normal. No respiratory distress. She has no wheezes. She has no rales.  Abdominal: Soft. She exhibits no distension. There is no tenderness.  Lymphadenopathy: She has no cervical adenopathy.  Skin: Skin is warm and dry. She is not diaphoretic.  Psychiatric: She has a normal mood and affect. Her behavior is normal.     Lab Results  Component Value Date   WBC 6.3 05/25/2022   HGB 13.2 05/25/2022   HCT 40.8 05/25/2022   PLT 252.0 05/25/2022   GLUCOSE 87 05/25/2022   CHOL 170 05/25/2022   TRIG 62.0 05/25/2022   HDL 58.60 05/25/2022   LDLCALC 99 05/25/2022   ALT 12 05/25/2022   AST 17 05/25/2022   NA 137 05/25/2022   K 3.9 05/25/2022   CL 104 05/25/2022   CREATININE 0.65 05/25/2022  BUN 10 05/25/2022   CO2 27 05/25/2022   TSH 0.05 (L) 05/25/2022   HGBA1C 5.9 05/25/2022         Assessment & Plan:       See Problem List for Assessment and Plan of chronic medical problems.

## 2023-05-30 NOTE — Patient Instructions (Addendum)
      Blood work was ordered.   The lab is on the first floor.    Medications changes include :   hyoscyamine as needed for IBS     Return in about 1 year (around 05/30/2024) for follow up.

## 2023-05-31 ENCOUNTER — Ambulatory Visit (INDEPENDENT_AMBULATORY_CARE_PROVIDER_SITE_OTHER): Payer: Medicare Other | Admitting: Internal Medicine

## 2023-05-31 VITALS — BP 124/76 | HR 85 | Temp 98.3°F | Ht 67.0 in | Wt 222.2 lb

## 2023-05-31 DIAGNOSIS — R739 Hyperglycemia, unspecified: Secondary | ICD-10-CM

## 2023-05-31 DIAGNOSIS — M85852 Other specified disorders of bone density and structure, left thigh: Secondary | ICD-10-CM

## 2023-05-31 DIAGNOSIS — K580 Irritable bowel syndrome with diarrhea: Secondary | ICD-10-CM

## 2023-05-31 DIAGNOSIS — Z6832 Body mass index (BMI) 32.0-32.9, adult: Secondary | ICD-10-CM

## 2023-05-31 DIAGNOSIS — M545 Low back pain, unspecified: Secondary | ICD-10-CM | POA: Diagnosis not present

## 2023-05-31 DIAGNOSIS — Z0000 Encounter for general adult medical examination without abnormal findings: Secondary | ICD-10-CM

## 2023-05-31 DIAGNOSIS — F419 Anxiety disorder, unspecified: Secondary | ICD-10-CM

## 2023-05-31 DIAGNOSIS — E559 Vitamin D deficiency, unspecified: Secondary | ICD-10-CM | POA: Diagnosis not present

## 2023-05-31 DIAGNOSIS — E890 Postprocedural hypothyroidism: Secondary | ICD-10-CM | POA: Diagnosis not present

## 2023-05-31 DIAGNOSIS — E6609 Other obesity due to excess calories: Secondary | ICD-10-CM | POA: Diagnosis not present

## 2023-05-31 LAB — COMPREHENSIVE METABOLIC PANEL
ALT: 18 U/L (ref 0–35)
AST: 20 U/L (ref 0–37)
Albumin: 3.9 g/dL (ref 3.5–5.2)
Alkaline Phosphatase: 53 U/L (ref 39–117)
BUN: 15 mg/dL (ref 6–23)
CO2: 26 meq/L (ref 19–32)
Calcium: 9.2 mg/dL (ref 8.4–10.5)
Chloride: 106 meq/L (ref 96–112)
Creatinine, Ser: 0.67 mg/dL (ref 0.40–1.20)
GFR: 89.29 mL/min (ref >60.00)
Glucose, Bld: 81 mg/dL (ref 70–99)
Potassium: 3.9 meq/L (ref 3.5–5.1)
Sodium: 139 meq/L (ref 135–145)
Total Bilirubin: 1 mg/dL (ref 0.2–1.2)
Total Protein: 6.1 g/dL (ref 6.0–8.3)

## 2023-05-31 LAB — LIPID PANEL
Cholesterol: 145 mg/dL (ref 0–200)
HDL: 54.8 mg/dL (ref >39.00)
LDL Cholesterol: 76 mg/dL (ref 0–99)
NonHDL: 90.27
Total CHOL/HDL Ratio: 3
Triglycerides: 71 mg/dL (ref 0.0–149.0)
VLDL: 14.2 mg/dL (ref 0.0–40.0)

## 2023-05-31 LAB — CBC WITH DIFFERENTIAL/PLATELET
Basophils Absolute: 0 10*3/uL (ref 0.0–0.1)
Basophils Relative: 0.4 % (ref 0.0–3.0)
Eosinophils Absolute: 0.1 10*3/uL (ref 0.0–0.7)
Eosinophils Relative: 1.7 % (ref 0.0–5.0)
HCT: 38.4 % (ref 36.0–46.0)
Hemoglobin: 12.6 g/dL (ref 12.0–15.0)
Lymphocytes Relative: 48.1 % — ABNORMAL HIGH (ref 12.0–46.0)
Lymphs Abs: 2.6 10*3/uL (ref 0.7–4.0)
MCHC: 32.9 g/dL (ref 30.0–36.0)
MCV: 94.3 fl (ref 78.0–100.0)
Monocytes Absolute: 0.4 10*3/uL (ref 0.1–1.0)
Monocytes Relative: 7.7 % (ref 3.0–12.0)
Neutro Abs: 2.3 10*3/uL (ref 1.4–7.7)
Neutrophils Relative %: 42.1 % — ABNORMAL LOW (ref 43.0–77.0)
Platelets: 229 10*3/uL (ref 150.0–400.0)
RBC: 4.07 Mil/uL (ref 3.87–5.11)
RDW: 14.2 % (ref 11.5–15.5)
WBC: 5.5 10*3/uL (ref 4.0–10.5)

## 2023-05-31 LAB — HEMOGLOBIN A1C: Hgb A1c MFr Bld: 5.6 % (ref 4.6–6.5)

## 2023-05-31 MED ORDER — BACLOFEN 20 MG PO TABS: 20.0000 mg | ORAL_TABLET | Freq: Every evening | ORAL | 1 refills | Status: DC | PRN

## 2023-05-31 MED ORDER — HYOSCYAMINE SULFATE 0.125 MG PO TBDP: 0.1250 mg | ORAL_TABLET | ORAL | 2 refills | Status: DC | PRN

## 2023-05-31 MED ORDER — METHOCARBAMOL 500 MG PO TABS: ORAL_TABLET | ORAL | 1 refills | Status: DC

## 2023-05-31 NOTE — Assessment & Plan Note (Addendum)
Chronic Management per her integrative doctor On Armour Thyroid 90 mg daily Check tfts

## 2023-05-31 NOTE — Assessment & Plan Note (Signed)
Chronic Controlled, Stable Continue lorazepam 1 mg twice daily as needed

## 2023-05-31 NOTE — Assessment & Plan Note (Addendum)
Chronic Not able to exercise until knee is better - will restart once able Low sugar/carbohydrate diet, decrease portions

## 2023-05-31 NOTE — Assessment & Plan Note (Signed)
Chronic DEXA up-to-date Stressed regular exercise Stressed calcium, vitamin D

## 2023-05-31 NOTE — Assessment & Plan Note (Signed)
Chronic Check a1c Low sugar / carb diet Stressed regular exercise  Lab Results  Component Value Date   HGBA1C 5.9 05/25/2022

## 2023-05-31 NOTE — Assessment & Plan Note (Signed)
Chronic Intermittent symptoms Hyoscyamine 0.125 mg SL Q 4 hr prn

## 2023-05-31 NOTE — Assessment & Plan Note (Signed)
Chronic Intermittent back pain w/  w/o sciatica Taking baclofen at night  Taking methocarbamol during the day

## 2023-05-31 NOTE — Assessment & Plan Note (Addendum)
Chronic Last vitamin D level normal Encouraged vitamin D intake daily Check vitamin d level

## 2023-06-01 LAB — VITAMIN D 25 HYDROXY (VIT D DEFICIENCY, FRACTURES): VITD: 33.99 ng/mL (ref 30.00–100.00)

## 2023-06-01 LAB — TSH: TSH: 0.15 u[IU]/mL — ABNORMAL LOW (ref 0.35–5.50)

## 2023-06-01 LAB — T3, FREE: T3, Free: 3.4 pg/mL (ref 2.3–4.2)

## 2023-06-01 LAB — T4, FREE: Free T4: 0.78 ng/dL (ref 0.60–1.60)

## 2023-06-05 ENCOUNTER — Encounter: Admitting: Internal Medicine

## 2023-06-08 DIAGNOSIS — M25562 Pain in left knee: Secondary | ICD-10-CM | POA: Diagnosis not present

## 2023-06-08 DIAGNOSIS — M79605 Pain in left leg: Secondary | ICD-10-CM | POA: Diagnosis not present

## 2023-06-24 DIAGNOSIS — M79605 Pain in left leg: Secondary | ICD-10-CM | POA: Diagnosis not present

## 2023-06-24 DIAGNOSIS — M25562 Pain in left knee: Secondary | ICD-10-CM | POA: Diagnosis not present

## 2023-07-03 DIAGNOSIS — M79605 Pain in left leg: Secondary | ICD-10-CM | POA: Diagnosis not present

## 2023-07-03 DIAGNOSIS — M25562 Pain in left knee: Secondary | ICD-10-CM | POA: Diagnosis not present

## 2023-07-13 DIAGNOSIS — H2513 Age-related nuclear cataract, bilateral: Secondary | ICD-10-CM | POA: Diagnosis not present

## 2023-07-13 DIAGNOSIS — H353211 Exudative age-related macular degeneration, right eye, with active choroidal neovascularization: Secondary | ICD-10-CM | POA: Diagnosis not present

## 2023-07-13 DIAGNOSIS — H43813 Vitreous degeneration, bilateral: Secondary | ICD-10-CM | POA: Diagnosis not present

## 2023-07-13 DIAGNOSIS — H353121 Nonexudative age-related macular degeneration, left eye, early dry stage: Secondary | ICD-10-CM | POA: Diagnosis not present

## 2023-07-31 DIAGNOSIS — M797 Fibromyalgia: Secondary | ICD-10-CM | POA: Diagnosis not present

## 2023-07-31 DIAGNOSIS — N951 Menopausal and female climacteric states: Secondary | ICD-10-CM | POA: Diagnosis not present

## 2023-07-31 DIAGNOSIS — K589 Irritable bowel syndrome without diarrhea: Secondary | ICD-10-CM | POA: Diagnosis not present

## 2023-07-31 DIAGNOSIS — E891 Postprocedural hypoinsulinemia: Secondary | ICD-10-CM | POA: Diagnosis not present

## 2023-08-02 DIAGNOSIS — M2550 Pain in unspecified joint: Secondary | ICD-10-CM | POA: Diagnosis not present

## 2023-08-02 DIAGNOSIS — N951 Menopausal and female climacteric states: Secondary | ICD-10-CM | POA: Diagnosis not present

## 2023-08-02 DIAGNOSIS — E890 Postprocedural hypothyroidism: Secondary | ICD-10-CM | POA: Diagnosis not present

## 2023-08-02 DIAGNOSIS — M797 Fibromyalgia: Secondary | ICD-10-CM | POA: Diagnosis not present

## 2023-08-02 DIAGNOSIS — L659 Nonscarring hair loss, unspecified: Secondary | ICD-10-CM | POA: Diagnosis not present

## 2023-08-02 DIAGNOSIS — E6609 Other obesity due to excess calories: Secondary | ICD-10-CM | POA: Diagnosis not present

## 2023-08-02 DIAGNOSIS — Z6836 Body mass index (BMI) 36.0-36.9, adult: Secondary | ICD-10-CM | POA: Diagnosis not present

## 2023-08-02 DIAGNOSIS — K589 Irritable bowel syndrome without diarrhea: Secondary | ICD-10-CM | POA: Diagnosis not present

## 2023-08-03 DIAGNOSIS — M25562 Pain in left knee: Secondary | ICD-10-CM | POA: Diagnosis not present

## 2023-08-24 DIAGNOSIS — M25562 Pain in left knee: Secondary | ICD-10-CM | POA: Diagnosis not present

## 2023-09-03 DIAGNOSIS — M25562 Pain in left knee: Secondary | ICD-10-CM | POA: Diagnosis not present

## 2023-09-07 ENCOUNTER — Other Ambulatory Visit: Payer: Self-pay | Admitting: Internal Medicine

## 2023-10-02 DIAGNOSIS — H2513 Age-related nuclear cataract, bilateral: Secondary | ICD-10-CM | POA: Diagnosis not present

## 2023-10-02 DIAGNOSIS — H43813 Vitreous degeneration, bilateral: Secondary | ICD-10-CM | POA: Diagnosis not present

## 2023-10-02 DIAGNOSIS — H353211 Exudative age-related macular degeneration, right eye, with active choroidal neovascularization: Secondary | ICD-10-CM | POA: Diagnosis not present

## 2023-10-02 DIAGNOSIS — H353121 Nonexudative age-related macular degeneration, left eye, early dry stage: Secondary | ICD-10-CM | POA: Diagnosis not present

## 2023-10-08 ENCOUNTER — Encounter: Payer: Self-pay | Admitting: Internal Medicine

## 2023-10-08 NOTE — Progress Notes (Signed)
 Subjective:    Patient ID: Patricia Greene, female    DOB: [birth year], 68 y.o.   MRN: [id number]      HPI Patricia Greene is here for  Chief Complaint  Patient presents with   [facility]    Patient having bloating and weight gain   Abdominal pain, GB pain -  she is S/p cholecystectomy.    One night woke up with same pain as GB pain - lasted 20 min- had the pain a couple of times after, but none since.    Orange stool at times. Occurs when she has loose  stools - orange color with it.  Takes Kefir sometimes and that helps to solidify the stool and does not have loose stools.     Bloating - not related to food or drinks. Can gain weight over night 3-5 lbs - not related to foods, salt or other obvious causes.  She will urinate a lot over night and it takes a couple of days to even out.    Weight concerns - not able to exercise much due to knee.  Feels she is eating healthy and not eating too much.  Would like some health - her weight keep going up and she is concerned.       Medications and allergies reviewed with patient and updated if appropriate.  Current Outpatient Medications on File Prior to Visit  Medication Sig Dispense Refill   baclofen  (LIORESAL ) 20 MG tablet Take 1 tablet (20 mg total) by mouth at bedtime as needed. 90 tablet 1   hyoscyamine  (ANASPAZ ) 0.125 MG TBDP disintergrating tablet Place 1-2 tablets (0.125-0.25 mg total) under the tongue every 4 (four) hours as needed for cramping. 60 tablet 2   LORazepam  (ATIVAN ) 1 MG tablet Take 1 tablet by mouth twice daily as needed for anxiety 60 tablet 0   meloxicam  (MOBIC ) 15 MG tablet Take by mouth.     methocarbamol  (ROBAXIN ) 500 MG tablet TAKE 1 TABLET BY MOUTH EVERY 6 HOURS AS NEEDED FOR MUSCLE SPASM 90 tablet 1   thyroid  (ARMOUR) 90 MG tablet Take 90 mg by mouth daily.     No current facility-administered medications on file prior to visit.    Review of Systems  Respiratory:  Negative for shortness of breath.    Cardiovascular:  Negative for chest pain, palpitations and leg swelling.  Gastrointestinal:  Positive for abdominal pain (occ). Negative for constipation and diarrhea.       Objective:   Vitals:   10/09/23 1537  BP: 128/74  Pulse: 72  Temp: 98.3 F (36.8 C)  SpO2: 97%   BP Readings from Last 3 Encounters:  10/09/23 128/74  05/31/23 124/76  05/25/22 120/78   Wt Readings from Last 3 Encounters:  10/09/23 221 lb 9.6 oz (100.5 kg)  05/31/23 222 lb 3.2 oz (100.8 kg)  12/12/22 204 lb (92.5 kg)   Body mass index is 34.71 kg/m.    Physical Exam Constitutional:      General: She is not in acute distress.    Appearance: Normal appearance. She is not ill-appearing.  HENT:     Head: Normocephalic and atraumatic.  Abdominal:     General: There is no distension.     Palpations: Abdomen is soft.     Tenderness: There is no abdominal tenderness. There is no guarding or rebound.  Skin:    General: Skin is warm and dry.     Findings: No rash.  Neurological:     Mental  Status: She is alert.            Assessment & Plan:    See Problem List for Assessment and Plan of chronic medical problems.

## 2023-10-09 ENCOUNTER — Ambulatory Visit (INDEPENDENT_AMBULATORY_CARE_PROVIDER_SITE_OTHER): Payer: Medicare Other | Admitting: Internal Medicine

## 2023-10-09 VITALS — BP 128/74 | HR 72 | Temp 98.3°F | Ht 67.0 in | Wt 221.6 lb

## 2023-10-09 DIAGNOSIS — Z136 Encounter for screening for cardiovascular disorders: Secondary | ICD-10-CM | POA: Diagnosis not present

## 2023-10-09 DIAGNOSIS — I2510 Atherosclerotic heart disease of native coronary artery without angina pectoris: Secondary | ICD-10-CM | POA: Insufficient documentation

## 2023-10-09 DIAGNOSIS — R109 Unspecified abdominal pain: Secondary | ICD-10-CM | POA: Insufficient documentation

## 2023-10-09 DIAGNOSIS — I2584 Coronary atherosclerosis due to calcified coronary lesion: Secondary | ICD-10-CM

## 2023-10-09 DIAGNOSIS — E66811 Obesity, class 1: Secondary | ICD-10-CM

## 2023-10-09 DIAGNOSIS — Z6834 Body mass index (BMI) 34.0-34.9, adult: Secondary | ICD-10-CM | POA: Diagnosis not present

## 2023-10-09 DIAGNOSIS — E6609 Other obesity due to excess calories: Secondary | ICD-10-CM

## 2023-10-09 MED ORDER — WEGOVY 0.25 MG/0.5ML ~~LOC~~ SOAJ: 0.2500 mg | SUBCUTANEOUS | 0 refills | Status: DC

## 2023-10-09 NOTE — Patient Instructions (Addendum)
         Medications changes include :   wegovy 0.25 mg weekly    A Ct scan of your heart arteries was ordered and someone will call you to schedule an appointment.

## 2023-10-09 NOTE — Assessment & Plan Note (Signed)
 Chronic Not able to exercise until knee is better - will restart once cleared by ortho - will do what she is able to now Low sugar/carbohydrate diet, decrease portions Has mild CAD - Discussed weight loss to help reduce risk of CAD progression and weight loss - start wegovy  0.25 mg weekly - discussed possible side effects - titration as tolerated

## 2023-10-09 NOTE — Assessment & Plan Note (Signed)
 Had an episode of abdominal pain - similar to previous GB pain  She is s/p cholecystectomy  Discussed pain could have been galstone or IBS - cramping Pain free now, exam benign Monitor for now

## 2023-10-09 NOTE — Assessment & Plan Note (Signed)
 Chronic Has had some calcifications in coronary arteries on Ct scan in 2022 Will get Ct scan of coronary arteries to evaluate further BP well controlled, sugars normal Discussed weight loss to help reduce risk of CAD progression - start wegovy  0.25 mg weekly - discussed possible side effects - titration as tolerated

## 2023-10-16 ENCOUNTER — Encounter: Payer: Self-pay | Admitting: Physician Assistant

## 2023-10-16 ENCOUNTER — Telehealth: Payer: Medicare Other | Admitting: Physician Assistant

## 2023-10-16 ENCOUNTER — Ambulatory Visit: Payer: Self-pay | Admitting: Internal Medicine

## 2023-10-16 DIAGNOSIS — R6889 Other general symptoms and signs: Secondary | ICD-10-CM | POA: Diagnosis not present

## 2023-10-16 MED ORDER — OSELTAMIVIR PHOSPHATE 75 MG PO CAPS: 75.0000 mg | ORAL_CAPSULE | Freq: Two times a day (BID) | ORAL | 0 refills | Status: AC

## 2023-10-16 MED ORDER — PROMETHAZINE-DM 6.25-15 MG/5ML PO SYRP: 5.0000 mL | ORAL_SOLUTION | Freq: Four times a day (QID) | ORAL | 0 refills | Status: DC | PRN

## 2023-10-16 MED ORDER — ALBUTEROL SULFATE HFA 108 (90 BASE) MCG/ACT IN AERS
2.0000 | INHALATION_SPRAY | Freq: Four times a day (QID) | RESPIRATORY_TRACT | 0 refills | Status: DC | PRN
Start: 2023-10-16 — End: 2023-12-14

## 2023-10-16 NOTE — Telephone Encounter (Signed)
  Chief Complaint: cough Symptoms: cough, headache, body aches, chills, dizzy, fever Frequency: since yesterday Pertinent Negatives: Patient denies sob, chest pain Disposition: [] ED /[x] Urgent Care (no appt availability in office) / [] Appointment(In office/virtual)/ []  [facility]/ [] Home Care/ [] Refused Recommended Disposition /[] [facility]/ []  Follow-up with PCP Additional Notes: Patient reports she got back from a trip to [place]  yesterday and has been experiencing cough, headache, body aches, chills, dizziness, and fever ever since. Per protocol, this RN attempted to schedule in office appt, no availability until tomorrow, so this RN scheduled virtual UC appt. Patient advised to call back with worsening symptoms. Patient verbalized understanding.      Copied from CRM [id number]. Topic: Clinical - Red Word Triage >> Oct 16, 2023  8:04 AM Joanell NOVAK wrote: Red Word that prompted transfer to Nurse Triage: Pt stated that she has been feeling sick, coughing, headach, bodyach, chills, light headed, fever of 101. Reason for Disposition  [1] Fever > 101 F (38.3 C) AND [2] age > 60 years  Answer Assessment - Initial Assessment Questions 1. ONSET: When did the cough begin?      Last night 2. SEVERITY: How bad is the cough today?      severe 3. SPUTUM: Describe the color of your sputum (none, dry cough; clear, white, yellow, green)     clear 4. HEMOPTYSIS: Are you coughing up any blood? If so ask: How much? (flecks, streaks, tablespoons, etc.)     none 5. DIFFICULTY BREATHING: Are you having difficulty breathing? If Yes, ask: How bad is it? (e.g., mild, moderate, severe)    - MILD: No SOB at rest, mild SOB with walking, speaks normally in sentences, can lie down, no retractions, pulse < 100.    - MODERATE: SOB at rest, SOB with minimal exertion and prefers to sit, cannot lie down flat, speaks in phrases, mild retractions, audible wheezing, pulse 100-120.    -  SEVERE: Very SOB at rest, speaks in single words, struggling to breathe, sitting hunched forward, retractions, pulse > 120      none 6. FEVER: Do you have a fever? If Yes, ask: What is your temperature, how was it measured, and when did it start?     101F 7. CARDIAC HISTORY: Do you have any history of heart disease? (e.g., heart attack, congestive heart failure)      none 8. LUNG HISTORY: Do you have any history of lung disease?  (e.g., pulmonary embolus, asthma, emphysema)     none 9. PE RISK FACTORS: Do you have a history of blood clots? (or: recent major surgery, recent prolonged travel, bedridden)     Recent plane travel 10. OTHER SYMPTOMS: Do you have any other symptoms? (e.g., runny nose, wheezing, chest pain)       Headache, body aches, chills, dizzy 11. PREGNANCY: Is there any chance you are pregnant? When was your last menstrual period?       N/a 12. TRAVEL: Have you traveled out of the country in the last month? (e.g., travel history, exposures)       Recently traveled to [place]   Protocols used: Cough - Acute Non-Productive-A-AH

## 2023-10-16 NOTE — Patient Instructions (Signed)
 Boby CINDERELLA Lunger, thank you for joining Elsie Velma Lunger, PA-C for today's virtual visit.  While this provider is not your primary care provider (PCP), if your PCP is located in our provider database this encounter information will be shared with them immediately following your visit.   A [facility] MyChart account gives you access to today's visit and all your visits, tests, and labs performed at [facility]  click here if you don't have a [facility] MyChart account or go to [email or web address]  Consent: (Patient) AMBERT VIRRUETA provided verbal consent for this virtual visit at the beginning of the encounter.  Current Medications:  Current Outpatient Medications:    albuterol  (VENTOLIN  HFA) 108 (90 Base) MCG/ACT inhaler, Inhale 2 puffs into the lungs every 6 (six) hours as needed for wheezing or shortness of breath., Disp: 8 g, Rfl: 0   oseltamivir  (TAMIFLU ) 75 MG capsule, Take 1 capsule (75 mg total) by mouth 2 (two) times daily for 5 days., Disp: 10 capsule, Rfl: 0   promethazine -dextromethorphan (PROMETHAZINE -DM) 6.25-15 MG/5ML syrup, Take 5 mLs by mouth 4 (four) times daily as needed for cough., Disp: 118 mL, Rfl: 0   baclofen  (LIORESAL ) 20 MG tablet, Take 1 tablet (20 mg total) by mouth at bedtime as needed., Disp: 90 tablet, Rfl: 1   hyoscyamine  (ANASPAZ ) 0.125 MG TBDP disintergrating tablet, Place 1-2 tablets (0.125-0.25 mg total) under the tongue every 4 (four) hours as needed for cramping., Disp: 60 tablet, Rfl: 2   LORazepam  (ATIVAN ) 1 MG tablet, Take 1 tablet by mouth twice daily as needed for anxiety, Disp: 60 tablet, Rfl: 0   meloxicam  (MOBIC ) 15 MG tablet, Take by mouth., Disp: , Rfl:    methocarbamol  (ROBAXIN ) 500 MG tablet, TAKE 1 TABLET BY MOUTH EVERY 6 HOURS AS NEEDED FOR MUSCLE SPASM, Disp: 90 tablet, Rfl: 1   Semaglutide -Weight Management (WEGOVY ) 0.25 MG/0.5ML SOAJ, Inject 0.25 mg into the skin once a week. Dx Code: I25.10, I25.84, Disp: 2 mL, Rfl:  0   thyroid  (ARMOUR) 90 MG tablet, Take 90 mg by mouth daily., Disp: , Rfl:    Medications ordered in this encounter:  Meds ordered this encounter  Medications   oseltamivir  (TAMIFLU ) 75 MG capsule    Sig: Take 1 capsule (75 mg total) by mouth 2 (two) times daily for 5 days.    Dispense:  10 capsule    Refill:  0    Supervising Provider:   LAMPTEY, PHILIP O [[id number]]   promethazine -dextromethorphan (PROMETHAZINE -DM) 6.25-15 MG/5ML syrup    Sig: Take 5 mLs by mouth 4 (four) times daily as needed for cough.    Dispense:  118 mL    Refill:  0    Supervising Provider:   LAMPTEY, PHILIP O [[id number]]   albuterol  (VENTOLIN  HFA) 108 (90 Base) MCG/ACT inhaler    Sig: Inhale 2 puffs into the lungs every 6 (six) hours as needed for wheezing or shortness of breath.    Dispense:  8 g    Refill:  0    Supervising Provider:   BLAISE ALEENE KIDD [[id number]]     *If you need refills on other medications prior to your next appointment, please contact your pharmacy*  Follow-Up: Call back or seek an in-person evaluation if the symptoms worsen or if the condition fails to improve as anticipated.  [facility] [phone]  Other Instructions Please keep well-hydrated and try to get plenty of rest. If you have a humidifier, place it in the bedroom  and run it at night. Start a saline nasal rinse for nasal congestion. You can consider use of a nasal steroid spray like Flonase  or Nasacort OTC. You can alternate between Tylenol  and Ibuprofen  if needed for fever, body aches, headache and/or throat pain. Salt water-gargles and chloraseptic spray can be very beneficial for sore throat. Mucinex -DM for congestion or cough. Please take all prescribed medications as directed.  Remain out of work until [employer] for 24 hours without a fever-reducing medication, and you are feeling better.  You should mask until symptoms are resolved.  If anything worsens despite treatment, you need to be evaluated  in-person. Please do not delay care.  Influenza, Adult Influenza is also called the flu. It is an infection in the lungs, nose, and throat (respiratory tract). It spreads easily from person to person (is contagious). The flu causes symptoms that are like a cold, along with high fever and body aches. What are the causes? This condition is caused by the influenza virus. You can get the virus by: Breathing in droplets that are in the air after a person infected with the flu coughed or sneezed. Touching something that has the virus on it and then touching your mouth, nose, or eyes. What increases the risk? Certain things may make you more likely to get the flu. These include: Not washing your hands often. Having close contact with many people during cold and flu season. Touching your mouth, eyes, or nose without first washing your hands. Not getting a flu shot every year. You may have a higher risk for the flu, and serious problems, such as a lung infection (pneumonia), if you: Are older than 65. Are pregnant. Have a weakened disease-fighting system (immune system) because of a disease or because you are taking certain medicines. Have a long-term (chronic) condition, such as: Heart, kidney, or lung disease. Diabetes. Asthma. Have a liver disorder. Are very overweight (morbidly obese). Have anemia. What are the signs or symptoms? Symptoms usually begin suddenly and last 4-14 days. They may include: Fever and chills. Headaches, body aches, or muscle aches. Sore throat. Cough. Runny or stuffy (congested) nose. Feeling discomfort in your chest. Not wanting to eat as much as normal. Feeling weak or tired. Feeling dizzy. Feeling sick to your stomach or throwing up. How is this treated? If the flu is found early, you can be treated with antiviral medicine. This can help to reduce how bad the illness is and how long it lasts. This may be given by mouth or through an IV tube. Taking care  of yourself at home can help your symptoms get better. Your doctor may want you to: Take over-the-counter medicines. Drink plenty of fluids. The flu often goes away on its own. If you have very bad symptoms or other problems, you may be treated in a hospital. Follow these instructions at home:     Activity Rest as needed. Get plenty of sleep. Stay home from work or school as told by your doctor. Do not leave home until you do not have a fever for 24 hours without taking medicine. Leave home only to go to your doctor. Eating and drinking Take an ORS (oral rehydration solution). This is a drink that is sold at pharmacies and stores. Drink enough fluid to keep your pee pale yellow. Drink clear fluids in small amounts as you are able. Clear fluids include: Water. Ice chips. Fruit juice mixed with water. Low-calorie sports drinks. Eat bland foods that are easy to digest. Eat  small amounts as you are able. These foods include: Bananas. Applesauce. Rice. Lean meats. Toast. Crackers. Do not eat or drink: Fluids that have a lot of sugar or caffeine. Alcohol. Spicy or fatty foods. General instructions Take over-the-counter and prescription medicines only as told by your doctor. Use a cool mist humidifier to add moisture to the air in your home. This can make it easier for you to breathe. When using a cool mist humidifier, clean it daily. Empty water and replace with clean water. Cover your mouth and nose when you cough or sneeze. Wash your hands with soap and water often and for at least 20 seconds. This is also important after you cough or sneeze. If you cannot use soap and water, use alcohol-based hand sanitizer. Keep all follow-up visits. How is this prevented?  Get a flu shot every year. You may get the flu shot in late summer, fall, or winter. Ask your doctor when you should get your flu shot. Avoid contact with people who are sick during fall and winter. This is cold and flu  season. Contact a doctor if: You get new symptoms. You have: Chest pain. Watery poop (diarrhea). A fever. Your cough gets worse. You start to have more mucus. You feel sick to your stomach. You throw up. Get help right away if you: Have shortness of breath. Have trouble breathing. Have skin or nails that turn a bluish color. Have very bad pain or stiffness in your neck. Get a sudden headache. Get sudden pain in your face or ear. Cannot eat or drink without throwing up. These symptoms may represent a serious problem that is an emergency. Get medical help right away. Call your local emergency services (911 in the [place] Do not wait to see if the symptoms will go away. Do not drive yourself to the hospital. Summary Influenza is also called the flu. It is an infection in the lungs, nose, and throat. It spreads easily from person to person. Take over-the-counter and prescription medicines only as told by your doctor. Getting a flu shot every year is the best way to not get the flu. This information is not intended to replace advice given to you by your health care provider. Make sure you discuss any questions you have with your health care provider. Document Revised: 05/07/2020 Document Reviewed: 05/07/2020 Elsevier Patient Education  2023 [employer]      If you have been instructed to have an in-person evaluation today at a local Urgent Care facility, please use the link below. It will take you to a list of all of our available [facility], including address, phone number and hours of operation. Please do not delay care.  [facility]  If you or a family member do not have a primary care provider, use the link below to schedule a visit and establish care. When you choose a [facility] primary care physician or advanced practice provider, you gain a long-term partner in health. Find a Primary Care Provider  Learn more about [facility] in-office and  virtual care options: [facility] - Get Care Now

## 2023-10-16 NOTE — Progress Notes (Signed)
 Virtual Visit Consent   Patricia Greene, you are scheduled for a virtual visit with a [facility] provider today. Just as with appointments in the office, your consent must be obtained to participate. Your consent will be active for this visit and any virtual visit you may have with one of our providers in the next 365 days. If you have a MyChart account, a copy of this consent can be sent to you electronically.  As this is a virtual visit, video technology does not allow for your provider to perform a traditional examination. This may limit your provider's ability to fully assess your condition. If your provider identifies any concerns that need to be evaluated in person or the need to arrange testing (such as labs, EKG, etc.), we will make arrangements to do so. Although advances in technology are sophisticated, we cannot ensure that it will always work on either your end or our end. If the connection with a video visit is poor, the visit may have to be switched to a telephone visit. With either a video or telephone visit, we are not always able to ensure that we have a secure connection.  By engaging in this virtual visit, you consent to the provision of healthcare and authorize for your insurance to be billed (if applicable) for the services provided during this visit. Depending on your insurance coverage, you may receive a charge related to this service.  I need to obtain your verbal consent now. Are you willing to proceed with your visit today? Patricia Greene has provided verbal consent on 10/16/2023 for a virtual visit (video or telephone). Elsie Velma Lunger, [place]  Date: 10/16/2023 8:48 AM  Virtual Visit via Video Note   I, Elsie Velma Lunger, connected with  Patricia Greene  ([id number], 09-21-1956) on 10/16/23 at  8:45 AM EST by a video-enabled telemedicine application and verified that I am speaking with the correct person using two identifiers.  Location: Patient: Virtual Visit Location  Patient: Home Provider: Virtual Visit Location Provider: Home Office   I discussed the limitations of evaluation and management by telemedicine and the availability of in person appointments. The patient expressed understanding and agreed to proceed.    History of Present Illness: Patricia Greene is a 68 y.o. who identifies as a female who was assigned female at birth, and is being seen today for flu-like symptoms starting within the past 2 days. Notes initially with mild cough with fast development of headache, body aches, fatigue, chills, fever, wheezing. Just got back last night from [place]  (plane). Denies known sick contact. Some lightheadedness while on the plan. Has not tested for COVID.  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   Unilateral primary osteoarthritis, left knee 02/21/2018   Chronic pain of left knee 01/23/2018   Helicobacter pylori gastritis 10/02/2017   Hx of adenomatous polyp of colon 09/27/2017   Closed wedge compression fracture of T11 vertebra (HCC) 03/15/2017   Reactive airway disease with wheezing 03/14/2017   Back muscle spasm 10/04/2016   H/O breast implant 04/15/2016   Osteopenia 02/01/2015   Allergic rhinitis 03/11/2011   Vitamin D  deficiency  03/13/2010   Migraine headache 03/13/2010   CAROTID BRUIT 03/13/2010   Hypothyroidism 03/09/2010   Anxiety 09/06/2007   FIBROMYALGIA 09/06/2007   IBS (irritable bowel syndrome) 09/06/2007    Allergies:  Allergies  Allergen Reactions   Cortisone     All steroids   Silicone Other (See Comments)    Pt reports autoimmune diseases, fatigue, hair falling  out   Medications:  Current Outpatient Medications:    albuterol  (VENTOLIN  HFA) 108 (90 Base) MCG/ACT inhaler, Inhale 2 puffs into the lungs every 6 (six) hours as needed for wheezing or shortness of breath., Disp: 8 g, Rfl: 0   oseltamivir  (TAMIFLU ) 75 MG capsule, Take 1 capsule (75 mg total) by mouth 2 (two) times daily for 5 days., Disp: 10 capsule, Rfl: 0   promethazine -dextromethorphan (PROMETHAZINE -DM) 6.25-15 MG/5ML syrup, Take 5 mLs by mouth 4 (four) times daily as needed for cough., Disp: 118 mL, Rfl: 0   baclofen  (LIORESAL ) 20 MG tablet, Take 1 tablet (20 mg total) by mouth at bedtime as needed., Disp: 90 tablet, Rfl: 1   hyoscyamine  (ANASPAZ ) 0.125 MG TBDP disintergrating tablet, Place 1-2 tablets (0.125-0.25 mg total) under the tongue every 4 (four) hours as needed for cramping., Disp: 60 tablet, Rfl: 2   LORazepam  (ATIVAN ) 1 MG tablet, Take 1 tablet by mouth twice daily as needed for anxiety, Disp: 60 tablet, Rfl: 0   meloxicam  (MOBIC ) 15 MG tablet, Take by mouth., Disp: , Rfl:    methocarbamol  (ROBAXIN ) 500 MG tablet, TAKE 1 TABLET BY MOUTH EVERY 6 HOURS AS NEEDED FOR MUSCLE SPASM, Disp: 90 tablet, Rfl: 1   Semaglutide -Weight Management (WEGOVY ) 0.25 MG/0.5ML SOAJ, Inject 0.25 mg into the skin once a week. Dx Code: I25.10, I25.84, Disp: 2 mL, Rfl: 0   thyroid  (ARMOUR) 90 MG tablet, Take 90 mg by mouth daily., Disp: , Rfl:   Observations/Objective: Patient is well-developed, well-nourished in no acute distress.  Resting comfortably  at home.  Head is normocephalic, atraumatic.  No labored breathing.  Speech is clear and coherent with logical content.  Patient is alert and oriented at baseline.   Assessment and Plan: 1. Flu-like symptoms (Primary) - oseltamivir  (TAMIFLU ) 75 MG capsule; Take 1 capsule (75 mg total) by mouth 2 (two) times daily for 5 days.  Dispense: 10 capsule; Refill: 0 - promethazine -dextromethorphan (PROMETHAZINE -DM) 6.25-15 MG/5ML syrup; Take 5 mLs by mouth 4  (four) times daily as needed for cough.  Dispense: 118 mL; Refill: 0 - albuterol  (VENTOLIN  HFA) 108 (90 Base) MCG/ACT inhaler; Inhale 2 puffs into the lungs every 6 (six) hours as needed for wheezing or shortness of breath.  Dispense: 8 g; Refill: 0  Classic influenza symptoms. Unknown exposure but recent plane travel. Supportive measures, OTC medications and Vitamin recommendations reviewed. Will have her take home COVID test as a precaution. She is to let us  know if positive. Giving near end of antiviral window for flu and classic symptoms, will start Tamiflu  per orders. Promethazine -dm and albuterol  per orders. Quarantine reviewed with patient.    Follow Up Instructions: I discussed the assessment and treatment plan with the patient. The patient was provided an opportunity to ask questions and all were answered. The patient agreed with the plan and demonstrated an understanding of the instructions.  A copy of instructions were sent to the patient via MyChart unless otherwise noted below.   The patient was advised to call back or seek an in-person evaluation if the symptoms worsen or if the condition fails to improve  as anticipated.    Elsie Velma Lunger, PA-C

## 2023-10-17 ENCOUNTER — Ambulatory Visit (HOSPITAL_BASED_OUTPATIENT_CLINIC_OR_DEPARTMENT_OTHER): Payer: Medicare Other | Attending: Internal Medicine

## 2023-10-22 ENCOUNTER — Ambulatory Visit (INDEPENDENT_AMBULATORY_CARE_PROVIDER_SITE_OTHER): Payer: Medicare Other | Admitting: Internal Medicine

## 2023-10-22 ENCOUNTER — Encounter: Payer: Self-pay | Admitting: Internal Medicine

## 2023-10-22 VITALS — BP 110/74 | HR 82 | Temp 97.6°F | Ht 67.0 in | Wt 215.0 lb

## 2023-10-22 DIAGNOSIS — J189 Pneumonia, unspecified organism: Secondary | ICD-10-CM | POA: Diagnosis not present

## 2023-10-22 MED ORDER — HYDROCODONE BIT-HOMATROP MBR 5-1.5 MG/5ML PO SOLN: 5.0000 mL | Freq: Three times a day (TID) | ORAL | 0 refills | Status: DC | PRN

## 2023-10-22 MED ORDER — AZITHROMYCIN 250 MG PO TABS: ORAL_TABLET | ORAL | 0 refills | Status: DC

## 2023-10-22 NOTE — Patient Instructions (Addendum)
       Medications changes include :   zpak, hycodan       Return if symptoms worsen or fail to improve.

## 2023-10-22 NOTE — Assessment & Plan Note (Signed)
Acute Concern for developing right lower lobe pneumonia Treated for flu last week Still feeling weak, tired, cough and wheezes in the right lower lung Concerning for pneumonia-start Z-Pak Hycodan cough syrup Rest, fluids Expect her symptoms to slowly improve and she will let me know if they do not Deferred chest x-ray

## 2023-10-22 NOTE — Progress Notes (Signed)
Subjective:    Patient ID: Patricia Greene, female    DOB: [birth year], 68 y.o.   MRN: [id number]      HPI Patricia Greene is here for  Chief Complaint  Patient presents with   Cough    Rm 20. Coughing and wheezing in bottom right lung. Has been going on since last Monday night, still feeling weak as well.   She started feeling sick last week and did a video visit 1/14.  Her symptoms were consistent with possible flu.  COVID test was negative.  She was prescribed Tamiflu twice daily x 5 days, prometh-dm, abluterol.  She was not able to complete the Tamiflu because it made her too sick.  She does feel like it helped her feel better.  She is still weak, has a cough that is productive of some clear mucus and wheezing in the right lower lobe especially at night.  She was concerned if she was developing pneumonia or why she has not started to feel little bit better   She is taking Sudafed, mucinex  Medications and allergies reviewed with patient and updated if appropriate.  Current Outpatient Medications on File Prior to Visit  Medication Sig Dispense Refill   albuterol (VENTOLIN HFA) 108 (90 Base) MCG/ACT inhaler Inhale 2 puffs into the lungs every 6 (six) hours as needed for wheezing or shortness of breath. 8 g 0   baclofen (LIORESAL) 20 MG tablet Take 1 tablet (20 mg total) by mouth at bedtime as needed. 90 tablet 1   hyoscyamine (ANASPAZ) 0.125 MG TBDP disintergrating tablet Place 1-2 tablets (0.125-0.25 mg total) under the tongue every 4 (four) hours as needed for cramping. 60 tablet 2   LORazepam (ATIVAN) 1 MG tablet Take 1 tablet by mouth twice daily as needed for anxiety 60 tablet 0   meloxicam (MOBIC) 15 MG tablet Take by mouth.     methocarbamol (ROBAXIN) 500 MG tablet TAKE 1 TABLET BY MOUTH EVERY 6 HOURS AS NEEDED FOR MUSCLE SPASM 90 tablet 1   promethazine-dextromethorphan (PROMETHAZINE-DM) 6.25-15 MG/5ML syrup Take 5 mLs by mouth 4 (four) times daily as needed for cough. 118 mL 0    Semaglutide-Weight Management (WEGOVY) 0.25 MG/0.5ML SOAJ Inject 0.25 mg into the skin once a week. Dx Code: I25.10, I25.84 2 mL 0   thyroid (ARMOUR) 90 MG tablet Take 90 mg by mouth daily.     No current facility-administered medications on file prior to visit.    Review of Systems  Constitutional:  Positive for fatigue. Negative for fever.  Respiratory:  Positive for cough (clear mucus) and wheezing (RLL at night). Negative for shortness of breath.   Neurological:  Positive for light-headedness. Negative for headaches.       Objective:   Vitals:   10/22/23 1536  BP: 110/74  Pulse: 82  Temp: 97.6 F (36.4 C)  SpO2: 98%   BP Readings from Last 3 Encounters:  10/22/23 110/74  10/09/23 128/74  05/31/23 124/76   Wt Readings from Last 3 Encounters:  10/22/23 215 lb (97.5 kg)  10/09/23 221 lb 9.6 oz (100.5 kg)  05/31/23 222 lb 3.2 oz (100.8 kg)   Body mass index is 33.67 kg/m.    Physical Exam Constitutional:      General: She is not in acute distress.    Appearance: Normal appearance.  HENT:     Head: Normocephalic and atraumatic.  Eyes:     Conjunctiva/sclera: Conjunctivae normal.  Cardiovascular:     Rate and Rhythm:  Normal rate and regular rhythm.     Heart sounds: Normal heart sounds.  Pulmonary:     Effort: Pulmonary effort is normal. No respiratory distress.     Breath sounds: Normal breath sounds. No wheezing.  Musculoskeletal:     Cervical back: Neck supple.     Right lower leg: No edema.     Left lower leg: No edema.  Lymphadenopathy:     Cervical: No cervical adenopathy.  Skin:    General: Skin is warm and dry.     Findings: No rash.  Neurological:     Mental Status: She is alert. Mental status is at baseline.  Psychiatric:        Mood and Affect: Mood normal.        Behavior: Behavior normal.            Assessment & Plan:    See Problem List for Assessment and Plan of chronic medical problems.

## 2023-10-25 ENCOUNTER — Ambulatory Visit (HOSPITAL_BASED_OUTPATIENT_CLINIC_OR_DEPARTMENT_OTHER)
Admission: RE | Admit: 2023-10-25 | Discharge: 2023-10-25 | Disposition: A | Discharge status: Home / Self Care | Payer: Self-pay | Source: Ambulatory Visit | Attending: Internal Medicine | Admitting: Internal Medicine

## 2023-10-25 ENCOUNTER — Encounter (HOSPITAL_BASED_OUTPATIENT_CLINIC_OR_DEPARTMENT_OTHER): Payer: Self-pay

## 2023-10-25 DIAGNOSIS — Z136 Encounter for screening for cardiovascular disorders: Secondary | ICD-10-CM

## 2023-11-01 ENCOUNTER — Telehealth: Payer: Self-pay | Admitting: Internal Medicine

## 2023-11-01 MED ORDER — DIAZEPAM 5 MG PO TABS: ORAL_TABLET | ORAL | 0 refills | Status: DC

## 2023-11-01 NOTE — Telephone Encounter (Signed)
Ok - valium sent to pharmacy.  She will have to have someone drive her.

## 2023-11-01 NOTE — Telephone Encounter (Signed)
Message left for patient today with Dr. Lawerance Bach recommendations

## 2023-11-01 NOTE — Telephone Encounter (Signed)
It is a Ct scan so it is not enclosed  - it is open and a quick test so she should not have any issues with claustrophobia

## 2023-11-01 NOTE — Addendum Note (Signed)
Addended by: Pincus Sanes on: 11/01/2023 04:42 PM   Modules accepted: Orders

## 2023-11-01 NOTE — Telephone Encounter (Signed)
Copied from CRM [id number]. Topic: General - Call Back - No Documentation >> Nov 01, 2023  8:54 AM Samuel Jester B wrote: Reason for CRM: Pt stated that she was scheduled for a heart CT scan and thought it was a ultrasound, and found out she had to put her head in there and couldn't do the test due to her being claustrophobic. She stated that she may need to take a medication to deal with the procedure.

## 2023-11-01 NOTE — Telephone Encounter (Signed)
Copied from CRM [id number]. Topic: General - Other >> Nov 01, 2023  3:11 PM Corin V wrote:  Reason for CRM: Patient requested that Albin Felling give her a call back regarding the voicemail she left for patient regarding the CT.

## 2023-11-02 NOTE — Telephone Encounter (Signed)
Message left for patient today 

## 2023-11-08 ENCOUNTER — Telehealth: Payer: Self-pay | Admitting: Internal Medicine

## 2023-11-08 MED ORDER — DIAZEPAM 5 MG PO TABS: ORAL_TABLET | ORAL | 0 refills | Status: AC

## 2023-11-08 NOTE — Telephone Encounter (Signed)
 Yep - resent

## 2023-11-08 NOTE — Telephone Encounter (Signed)
 Catherine from [facility] called in stating that the medication  valium  was sent in that patient can repeat the medication but there was only one pill called in

## 2023-11-14 ENCOUNTER — Ambulatory Visit (HOSPITAL_BASED_OUTPATIENT_CLINIC_OR_DEPARTMENT_OTHER)
Admission: RE | Admit: 2023-11-14 | Discharge: 2023-11-14 | Disposition: A | Discharge status: Home / Self Care | Payer: Self-pay | Source: Ambulatory Visit | Attending: Internal Medicine | Admitting: Internal Medicine

## 2023-11-14 DIAGNOSIS — Z136 Encounter for screening for cardiovascular disorders: Secondary | ICD-10-CM | POA: Insufficient documentation

## 2023-11-15 ENCOUNTER — Encounter: Payer: Self-pay | Admitting: Internal Medicine

## 2023-12-10 DIAGNOSIS — E890 Postprocedural hypothyroidism: Secondary | ICD-10-CM | POA: Diagnosis not present

## 2023-12-10 DIAGNOSIS — L659 Nonscarring hair loss, unspecified: Secondary | ICD-10-CM | POA: Diagnosis not present

## 2023-12-11 DIAGNOSIS — H43813 Vitreous degeneration, bilateral: Secondary | ICD-10-CM | POA: Diagnosis not present

## 2023-12-11 DIAGNOSIS — H2513 Age-related nuclear cataract, bilateral: Secondary | ICD-10-CM | POA: Diagnosis not present

## 2023-12-11 DIAGNOSIS — H353121 Nonexudative age-related macular degeneration, left eye, early dry stage: Secondary | ICD-10-CM | POA: Diagnosis not present

## 2023-12-11 DIAGNOSIS — H353211 Exudative age-related macular degeneration, right eye, with active choroidal neovascularization: Secondary | ICD-10-CM | POA: Diagnosis not present

## 2023-12-12 DIAGNOSIS — M797 Fibromyalgia: Secondary | ICD-10-CM | POA: Diagnosis not present

## 2023-12-12 DIAGNOSIS — N951 Menopausal and female climacteric states: Secondary | ICD-10-CM | POA: Diagnosis not present

## 2023-12-12 DIAGNOSIS — K589 Irritable bowel syndrome without diarrhea: Secondary | ICD-10-CM | POA: Diagnosis not present

## 2023-12-12 DIAGNOSIS — Z6835 Body mass index (BMI) 35.0-35.9, adult: Secondary | ICD-10-CM | POA: Diagnosis not present

## 2023-12-12 DIAGNOSIS — E890 Postprocedural hypothyroidism: Secondary | ICD-10-CM | POA: Diagnosis not present

## 2023-12-12 DIAGNOSIS — E6609 Other obesity due to excess calories: Secondary | ICD-10-CM | POA: Diagnosis not present

## 2023-12-12 DIAGNOSIS — L659 Nonscarring hair loss, unspecified: Secondary | ICD-10-CM | POA: Diagnosis not present

## 2023-12-12 DIAGNOSIS — M2550 Pain in unspecified joint: Secondary | ICD-10-CM | POA: Diagnosis not present

## 2023-12-14 ENCOUNTER — Ambulatory Visit: Payer: Medicare Other

## 2023-12-14 VITALS — Ht 65.0 in | Wt 213.0 lb

## 2023-12-14 DIAGNOSIS — Z780 Asymptomatic menopausal state: Secondary | ICD-10-CM | POA: Diagnosis not present

## 2023-12-14 DIAGNOSIS — Z0000 Encounter for general adult medical examination without abnormal findings: Secondary | ICD-10-CM

## 2023-12-14 DIAGNOSIS — Z0100 Encounter for examination of eyes and vision without abnormal findings: Secondary | ICD-10-CM

## 2023-12-14 NOTE — Patient Instructions (Addendum)
 Patricia Greene , Thank you for taking time to come for your Medicare Wellness Visit. I appreciate your ongoing commitment to your health goals. Please review the following plan we discussed and let me know if I can assist you in the future.   Referrals/Orders/Follow-Ups/Clinician Recommendations: Aim for 30 minutes of exercise or brisk walking, 6-8 glasses of water, and 5 servings of fruits and vegetables each day. Referral to [facility] for a routine eye exam.  DEXA Scan due in 09/2024.  This is a list of the screening recommended for you and due dates:  Health Maintenance  Topic Date Due   Zoster (Shingles) Vaccine (1 of 2) Never done   Flu Shot  12/31/2023*   Pneumonia Vaccine (1 of 2 - PCV) 05/30/2024*   DEXA scan (bone density measurement)  09/08/2024   Mammogram  11/01/2024   Medicare Annual Wellness Visit  12/13/2024   Colon Cancer Screening  08/23/2025   DTaP/Tdap/Td vaccine (2 - Td or Tdap) 12/29/2030   Hepatitis C Screening  Completed   HPV Vaccine  Aged Out   COVID-19 Vaccine  Discontinued  *Topic was postponed. The date shown is not the original due date.    Advanced directives: (Provided) Advance directive discussed with you today. I have provided a copy for you to complete at home and have notarized. Once this is complete, please bring a copy in to our office so we can scan it into your chart.   Next Medicare Annual Wellness Visit scheduled for next year: Yes

## 2023-12-14 NOTE — Progress Notes (Signed)
 Subjective:   Patricia Greene is a 68 y.o. who presents for a Medicare Wellness preventive visit.  Visit Complete: Virtual I connected with  Patricia Greene on 12/14/23 by a video and audio enabled telemedicine application and verified that I am speaking with the correct person using two identifiers.  Patient Location: Home  Provider Location: Office/Clinic  I discussed the limitations of evaluation and management by telemedicine. The patient expressed understanding and agreed to proceed.  Vital Signs: Because this visit was a virtual/telehealth visit, some criteria may be missing or patient reported. Any vitals not documented were not able to be obtained and vitals that have been documented are patient reported.   Persons Participating in Visit: Patient.  AWV Questionnaire: No: Patient Medicare AWV questionnaire was not completed prior to this visit.  Cardiac Risk Factors include: advanced age (>38men, >22 women);obesity (BMI >30kg/m2)     Objective:    Today's Vitals   12/14/23 1100  Weight: 213 lb (96.6 kg)  Height: 5\' 5"  (1.651 m)   Body mass index is 35.45 kg/m.     12/14/2023   10:55 AM 12/12/2022    4:38 PM 12/01/2021    3:19 PM 11/23/2020   11:51 AM 08/23/2020    2:22 PM 09/27/2017   10:28 AM 06/19/2017   12:00 AM  Advanced Directives  Does Patient Have a Medical Advance Directive? No No No No No No No  Would patient like information on creating a medical advance directive? Yes (MAU/Ambulatory/Procedural Areas - Information given) No - Patient declined No - Patient declined No - Patient declined No - Patient declined  No - Patient declined    Current Medications (verified) Outpatient Encounter Medications as of 12/14/2023  Medication Sig   baclofen (LIORESAL) 20 MG tablet Take 20 mg by mouth 1 day or 1 dose. As needed   FLUoxetine (PROZAC) 20 MG tablet Take 20 mg by mouth daily. As needed   LORazepam (ATIVAN) 1 MG tablet Take 1 tablet by mouth twice daily as  needed for anxiety   meloxicam (MOBIC) 15 MG tablet Take by mouth.   methocarbamol (ROBAXIN) 500 MG tablet TAKE 1 TABLET BY MOUTH EVERY 6 HOURS AS NEEDED FOR MUSCLE SPASM   thyroid (ARMOUR) 90 MG tablet Take 90 mg by mouth daily.   [DISCONTINUED] albuterol (VENTOLIN HFA) 108 (90 Base) MCG/ACT inhaler Inhale 2 puffs into the lungs every 6 (six) hours as needed for wheezing or shortness of breath.   [DISCONTINUED] azithromycin (ZITHROMAX) 250 MG tablet Take two tabs the first day and then one tab daily for four days   [DISCONTINUED] baclofen (LIORESAL) 20 MG tablet Take 1 tablet (20 mg total) by mouth at bedtime as needed.   [DISCONTINUED] HYDROcodone bit-homatropine (HYCODAN) 5-1.5 MG/5ML syrup Take 5 mLs by mouth every 8 (eight) hours as needed for cough.   [DISCONTINUED] hyoscyamine (ANASPAZ) 0.125 MG TBDP disintergrating tablet Place 1-2 tablets (0.125-0.25 mg total) under the tongue every 4 (four) hours as needed for cramping.   [DISCONTINUED] promethazine-dextromethorphan (PROMETHAZINE-DM) 6.25-15 MG/5ML syrup Take 5 mLs by mouth 4 (four) times daily as needed for cough.   [DISCONTINUED] Semaglutide-Weight Management (WEGOVY) 0.25 MG/0.5ML SOAJ Inject 0.25 mg into the skin once a week. Dx Code: I25.10, I25.84   No facility-administered encounter medications on file as of 12/14/2023.    Allergies (verified) Cortisone and Silicone   History: Past Medical History:  Diagnosis Date   Allergic rhinitis    Allergy    Anxiety state, unspecified  Arthritis    left thumb   Carotid bruit    Cervicalgia    Diverticulosis    Fibromyalgia    better since breast implants were removed   GERD (gastroesophageal reflux disease)    Helicobacter pylori gastritis 10/02/2017   EGD and bx 12/.2018 Quad tx    History of migraine headaches    Hypothyroidism    nodular goiter   IBS (irritable bowel syndrome)    Insomnia    Obesity    Unspecified vitamin D deficiency    Past Surgical History:   Procedure Laterality Date   BREAST ENHANCEMENT SURGERY     breast implants removed     CHOLECYSTECTOMY N/A 06/19/2017   Procedure: LAPAROSCOPIC CHOLECYSTECTOMY WITH INTRAOPERATIVE CHOLANGIOGRAM,REPAIR OF DUODENAL INJURY;  Surgeon: Sheliah Hatch, De Blanch, MD;  Location: [facility];  Service: General;  Laterality: N/A;   COLONOSCOPY  1999, 2004 Sherin Quarry), 08/09/2011   1999, 2004: diverticulosis   COLONOSCOPY  08/23/2020   Gessner   finger reattachment     THYROIDECTOMY  1973   right, toxic nodule   TONSILLECTOMY     Family History  Problem Relation Age of Onset   Asthma Mother    Breast cancer Mother    Allergic rhinitis Mother    Heart disease Mother    Colon cancer Father 32       died at 58   Colon polyps Sister    Esophageal cancer Neg Hx    Stomach cancer Neg Hx    Rectal cancer Neg Hx    Pancreatic cancer Neg Hx    Liver disease Neg Hx    Social History   Socioeconomic History   Marital status: Divorced    Spouse name: Not on file   Number of children: 0   Years of education: Not on file   Highest education level: Not on file  Occupational History   Occupation: RN  Tobacco Use   Smoking status: Never    Passive exposure: Never   Smokeless tobacco: Never  Vaping Use   Vaping status: Never Used  Substance and Sexual Activity   Alcohol use: No   Drug use: No   Sexual activity: Not on file  Other Topics Concern   Not on file  Social History Narrative   2-3 caffeine drinks daily    Divorced, no children. RN trained, [employer] and Medcial Record Review Specilalist, now at school system   Social Drivers of Health   Financial Resource Strain: Low Risk  (12/14/2023)   Overall Financial Resource Strain (CARDIA)    Difficulty of Paying Living Expenses: Not hard at all  Food Insecurity: No Food Insecurity (12/14/2023)   Hunger Vital Sign    Worried About Running Out of Food in the Last Year: Never true    Ran Out of Food in the Last Year: Never true  Transportation  Needs: No Transportation Needs (12/14/2023)   PRAPARE - Administrator, Civil Service (Medical): No    Lack of Transportation (Non-Medical): No  Physical Activity: Inactive (12/14/2023)   Exercise Vital Sign    Days of Exercise per Week: 0 days    Minutes of Exercise per Session: 0 min  Stress: No Stress Concern Present (12/14/2023)   [employer] of Occupational Health - Occupational Stress Questionnaire    Feeling of Stress : Not at all  Social Connections: Moderately Integrated (12/14/2023)   Social Connection and Isolation Panel [NHANES]    Frequency of Communication with Friends and Family:  More than three times a week    Frequency of Social Gatherings with Friends and Family: More than three times a week    Attends Religious Services: More than 4 times per year    Active Member of Clubs or Organizations: Yes    Attends Engineer, structural: More than 4 times per year    Marital Status: Divorced    Tobacco Counseling - Non Smoker Counseling given: No    Clinical Intake:  Pre-visit preparation completed: Yes  Pain : No/denies pain     BMI - recorded: 35.45 Nutritional Status: BMI > 30  Obese Nutritional Risks: None Diabetes: No  How often do you need to have someone help you when you read instructions, pamphlets, or other written materials from your doctor or pharmacy?: 1 - Never  Interpreter Needed?: No  Information entered by :: Hassell Halim, CMA   Activities of Daily Living     12/14/2023   11:05 AM  In your present state of health, do you have any difficulty performing the following activities:  Hearing? 0  Vision? 0  Difficulty concentrating or making decisions? 0  Walking or climbing stairs? 0  Dressing or bathing? 0  Doing errands, shopping? 0  Preparing Food and eating ? N  Using the Toilet? N  In the past six months, have you accidently leaked urine? N  Do you have problems with loss of bowel control? N  Managing your  Medications? N  Managing your Finances? N  Housekeeping or managing your Housekeeping? N    Patient Care Team: Pincus Sanes, MD as PCP - General (Internal Medicine) Iva Boop, MD as Consulting Physician (Gastroenterology) Meisinger, Tawanna Cooler, MD as Consulting Physician (Obstetrics and Gynecology) Pollyann Savoy, MD as Consulting Physician (Rheumatology) Adrian Prince, MD as Consulting Physician (Endocrinology) Patricia Nettle, MD (Orthopedic Surgery) Stephannie Li, MD as Consulting Physician (Ophthalmology)  Indicate any recent Medical Services you may have received from other than [facility] providers in the past year (date may be approximate).     Assessment:   This is a routine wellness examination for Keilana.  Hearing/Vision screen Hearing Screening - Comments:: Denies hearing difficulties   Vision Screening - Comments:: Wears rx glasses - up to date with routine eye exams with Dr Allyne Gee (retina specialist). Referral made for new Ophthalmologist.   Goals Addressed               This Visit's Progress     Patient Stated (pt-stated)        Patient stated she plans to lose weight (about 20lbs) and exercise more.       Depression Screen     12/14/2023   11:10 AM 05/31/2023   11:00 AM 12/12/2022    3:29 PM 05/25/2022   11:09 AM 05/11/2022    3:22 PM 12/01/2021    3:24 PM 11/15/2021    1:27 PM  PHQ 2/9 Scores  PHQ - 2 Score 0 1 0 0 0 0 1  PHQ- 9 Score 0 5  0       Fall Risk     12/14/2023   11:06 AM 10/22/2023    3:57 PM 05/31/2023   11:00 AM 12/12/2022    2:29 PM 05/25/2022   11:08 AM  Fall Risk   Falls in the past year? 1 0 0 0 0  Number falls in past yr: 0 0 0 0 0  Comment 1 - on racquetball court (went to Urgent Care)  Injury with Fall? 1 0 0 0 0  Comment had a MRI - showed a tear      Risk for fall due to :  No Fall Risks No Fall Risks No Fall Risks No Fall Risks  Follow up Falls evaluation completed;Falls prevention discussed;Education provided Falls  evaluation completed Falls evaluation completed Falls prevention discussed;Education provided;Falls evaluation completed Falls evaluation completed    MEDICARE RISK AT HOME:  Medicare Risk at Home Any stairs in or around the home?: No If so, are there any without handrails?: No Home free of loose throw rugs in walkways, pet beds, electrical cords, etc?: Yes Adequate lighting in your home to reduce risk of falls?: Yes Life alert?: No Use of a cane, walker or w/c?: No Grab bars in the bathroom?: Yes Shower chair or bench in shower?: Yes Elevated toilet seat or a handicapped toilet?: Yes  TIMED UP AND GO:  Was the test performed?  No  Cognitive Function: 6CIT completed        12/14/2023   11:08 AM 12/12/2022    4:38 PM  6CIT Screen  What Year? 0 points 0 points  What month? 0 points 0 points  What time? 0 points 0 points  Count back from 20 0 points 0 points  Months in reverse 0 points 0 points  Repeat phrase 0 points 0 points  Total Score 0 points 0 points    Immunizations Immunization History  Administered Date(s) Administered   Influenza,trivalent, recombinat, inj, PF 06/30/2015   Tdap 12/28/2020    Screening Tests Health Maintenance  Topic Date Due   Zoster Vaccines- Shingrix (1 of 2) Never done   INFLUENZA VACCINE  12/31/2023 (Originally 05/03/2023)   Pneumonia Vaccine 18+ Years old (1 of 2 - PCV) 05/30/2024 (Originally 03/01/1962)   DEXA SCAN  09/08/2024   MAMMOGRAM  11/01/2024   Medicare Annual Wellness (AWV)  12/13/2024   Colonoscopy  08/23/2025   DTaP/Tdap/Td (2 - Td or Tdap) 12/29/2030   Hepatitis C Screening  Completed   HPV VACCINES  Aged Out   COVID-19 Vaccine  Discontinued    Health Maintenance  Health Maintenance Due  Topic Date Due   Zoster Vaccines- Shingrix (1 of 2) Never done   Health Maintenance Items Addressed:12/14/2023 Pt declines the Pneumonia, Shingles, ad Influenza vaccines.    DEXA scan due in 09/2024.  Ordered placed  today.   Additional Screening:  Vision Screening: Recommended annual ophthalmology exams for early detection of glaucoma and other disorders of the eye.  Referral to Dr Mateo Flow was placed today for an appt for a routine eye care.  Dental Screening: Recommended annual dental exams for proper oral hygiene  Community Resource Referral / Chronic Care Management: CRR required this visit?  No   CCM required this visit?  No     Plan:     I have personally reviewed and noted the following in the patient's chart:   Medical and social history Use of alcohol, tobacco or illicit drugs  Current medications and supplements including opioid prescriptions. Patient is not currently taking opioid prescriptions. Functional ability and status Nutritional status Physical activity Advanced directives List of other physicians Hospitalizations, surgeries, and ER visits in previous 12 months Vitals Screenings to include cognitive, depression, and falls Referrals and appointments  In addition, I have reviewed and discussed with patient certain preventive protocols, quality metrics, and best practice recommendations. A written personalized care plan for preventive services as well as general preventive health recommendations were provided  to patient.     Darreld Mclean, CMA   12/14/2023   After Visit Summary: (MyChart) Due to this being a telephonic visit, the after visit summary with patients personalized plan was offered to patient via MyChart   Notes: Please refer to Routing Comments.

## 2023-12-15 ENCOUNTER — Other Ambulatory Visit: Payer: Self-pay | Admitting: Internal Medicine

## 2023-12-26 DIAGNOSIS — Z1231 Encounter for screening mammogram for malignant neoplasm of breast: Secondary | ICD-10-CM | POA: Diagnosis not present

## 2024-02-14 DIAGNOSIS — H0100A Unspecified blepharitis right eye, upper and lower eyelids: Secondary | ICD-10-CM | POA: Diagnosis not present

## 2024-02-14 DIAGNOSIS — H353211 Exudative age-related macular degeneration, right eye, with active choroidal neovascularization: Secondary | ICD-10-CM | POA: Diagnosis not present

## 2024-02-14 DIAGNOSIS — H353121 Nonexudative age-related macular degeneration, left eye, early dry stage: Secondary | ICD-10-CM | POA: Diagnosis not present

## 2024-02-14 DIAGNOSIS — H2513 Age-related nuclear cataract, bilateral: Secondary | ICD-10-CM | POA: Diagnosis not present

## 2024-02-14 LAB — HM DIABETES EYE EXAM

## 2024-02-15 ENCOUNTER — Encounter: Payer: Self-pay | Admitting: Internal Medicine

## 2024-02-19 DIAGNOSIS — H353211 Exudative age-related macular degeneration, right eye, with active choroidal neovascularization: Secondary | ICD-10-CM | POA: Diagnosis not present

## 2024-02-19 DIAGNOSIS — H43813 Vitreous degeneration, bilateral: Secondary | ICD-10-CM | POA: Diagnosis not present

## 2024-02-19 DIAGNOSIS — H2513 Age-related nuclear cataract, bilateral: Secondary | ICD-10-CM | POA: Diagnosis not present

## 2024-02-19 DIAGNOSIS — H353121 Nonexudative age-related macular degeneration, left eye, early dry stage: Secondary | ICD-10-CM | POA: Diagnosis not present

## 2024-03-05 DIAGNOSIS — L810 Postinflammatory hyperpigmentation: Secondary | ICD-10-CM | POA: Diagnosis not present

## 2024-03-05 DIAGNOSIS — L814 Other melanin hyperpigmentation: Secondary | ICD-10-CM | POA: Diagnosis not present

## 2024-03-05 DIAGNOSIS — D239 Other benign neoplasm of skin, unspecified: Secondary | ICD-10-CM | POA: Diagnosis not present

## 2024-03-05 DIAGNOSIS — L821 Other seborrheic keratosis: Secondary | ICD-10-CM | POA: Diagnosis not present

## 2024-03-05 DIAGNOSIS — D1801 Hemangioma of skin and subcutaneous tissue: Secondary | ICD-10-CM | POA: Diagnosis not present

## 2024-03-05 DIAGNOSIS — Z87898 Personal history of other specified conditions: Secondary | ICD-10-CM | POA: Diagnosis not present

## 2024-04-10 DIAGNOSIS — E890 Postprocedural hypothyroidism: Secondary | ICD-10-CM | POA: Diagnosis not present

## 2024-04-10 DIAGNOSIS — E6609 Other obesity due to excess calories: Secondary | ICD-10-CM | POA: Diagnosis not present

## 2024-04-10 DIAGNOSIS — M797 Fibromyalgia: Secondary | ICD-10-CM | POA: Diagnosis not present

## 2024-04-10 DIAGNOSIS — K589 Irritable bowel syndrome without diarrhea: Secondary | ICD-10-CM | POA: Diagnosis not present

## 2024-04-10 DIAGNOSIS — N951 Menopausal and female climacteric states: Secondary | ICD-10-CM | POA: Diagnosis not present

## 2024-04-10 DIAGNOSIS — L659 Nonscarring hair loss, unspecified: Secondary | ICD-10-CM | POA: Diagnosis not present

## 2024-04-10 DIAGNOSIS — Z6833 Body mass index (BMI) 33.0-33.9, adult: Secondary | ICD-10-CM | POA: Diagnosis not present

## 2024-04-10 DIAGNOSIS — M2550 Pain in unspecified joint: Secondary | ICD-10-CM | POA: Diagnosis not present

## 2024-04-16 DIAGNOSIS — H04123 Dry eye syndrome of bilateral lacrimal glands: Secondary | ICD-10-CM | POA: Diagnosis not present

## 2024-04-16 DIAGNOSIS — H0100A Unspecified blepharitis right eye, upper and lower eyelids: Secondary | ICD-10-CM | POA: Diagnosis not present

## 2024-04-16 DIAGNOSIS — B880 Other acariasis: Secondary | ICD-10-CM | POA: Diagnosis not present

## 2024-04-16 DIAGNOSIS — H0100B Unspecified blepharitis left eye, upper and lower eyelids: Secondary | ICD-10-CM | POA: Diagnosis not present

## 2024-04-17 ENCOUNTER — Encounter: Payer: Self-pay | Admitting: Internal Medicine

## 2024-04-17 ENCOUNTER — Other Ambulatory Visit: Payer: Self-pay | Admitting: Internal Medicine

## 2024-05-07 DIAGNOSIS — H353211 Exudative age-related macular degeneration, right eye, with active choroidal neovascularization: Secondary | ICD-10-CM | POA: Diagnosis not present

## 2024-05-07 DIAGNOSIS — H43813 Vitreous degeneration, bilateral: Secondary | ICD-10-CM | POA: Diagnosis not present

## 2024-05-07 DIAGNOSIS — H2513 Age-related nuclear cataract, bilateral: Secondary | ICD-10-CM | POA: Diagnosis not present

## 2024-05-07 DIAGNOSIS — H353121 Nonexudative age-related macular degeneration, left eye, early dry stage: Secondary | ICD-10-CM | POA: Diagnosis not present

## 2024-06-08 ENCOUNTER — Encounter: Payer: Self-pay | Admitting: Internal Medicine

## 2024-06-08 NOTE — Progress Notes (Unsigned)
 Subjective:    Patient ID: Patricia Greene, female    DOB: July 08, 1956, 68 y.o.   MRN: [id number]     HPI Dezirae is here for follow up of her chronic medical problems.  Doing to integrative medicine.   Not exercising regularly -plans on starting.   Medications and allergies reviewed with patient and updated if appropriate.  Current Outpatient Medications on File Prior to Visit  Medication Sig Dispense Refill   ARMOUR THYROID  60 MG tablet Take 60 mg by mouth daily.     baclofen  (LIORESAL ) 20 MG tablet Take 20 mg by mouth 1 day or 1 dose. As needed     FLUoxetine  (PROZAC ) 20 MG tablet Take 20 mg by mouth daily. As needed     LORazepam  (ATIVAN ) 1 MG tablet Take 1 tablet by mouth twice daily as needed for anxiety 30 tablet 0   meloxicam  (MOBIC ) 15 MG tablet Take by mouth.     methocarbamol  (ROBAXIN ) 500 MG tablet TAKE 1 TABLET BY MOUTH EVERY 6 HOURS AS NEEDED FOR MUSCLE SPASM 90 tablet 1   SEMAGLUTIDE  PO Take by mouth.     thyroid  (ARMOUR) 90 MG tablet Take 90 mg by mouth daily. (Patient not taking: Reported on 06/09/2024)     No current facility-administered medications on file prior to visit.     Review of Systems  Constitutional:  Negative for fever.  Respiratory:  Positive for shortness of breath (when bending over or doing a lot). Negative for cough and wheezing.   Cardiovascular:  Positive for leg swelling (with travel). Negative for chest pain and palpitations.  Gastrointestinal:  Negative for abdominal pain, blood in stool, constipation and diarrhea.       Occ gerd  Musculoskeletal:  Positive for arthralgias (left knee) and back pain (chronic - currently good).  Neurological:  Negative for light-headedness and headaches.  Psychiatric/Behavioral:  Negative for dysphoric mood and sleep disturbance. The patient is not nervous/anxious.        Objective:   Vitals:   06/09/24 1307  BP: 112/78  Pulse: 71  Temp: 98.4 F (36.9 C)  SpO2: 97%   BP Readings from Last  3 Encounters:  06/09/24 112/78  10/22/23 110/74  10/09/23 128/74   Wt Readings from Last 3 Encounters:  06/09/24 212 lb (96.2 kg)  12/14/23 213 lb (96.6 kg)  10/22/23 215 lb (97.5 kg)   Body mass index is 35.28 kg/m.    Physical Exam Constitutional:      General: She is not in acute distress.    Appearance: Normal appearance.  HENT:     Head: Normocephalic and atraumatic.  Eyes:     Conjunctiva/sclera: Conjunctivae normal.  Cardiovascular:     Rate and Rhythm: Normal rate and regular rhythm.     Heart sounds: Normal heart sounds.  Pulmonary:     Effort: Pulmonary effort is normal. No respiratory distress.     Breath sounds: Normal breath sounds. No wheezing.  Musculoskeletal:     Cervical back: Neck supple.     Right lower leg: No edema.     Left lower leg: No edema.  Lymphadenopathy:     Cervical: No cervical adenopathy.  Skin:    General: Skin is warm and dry.     Findings: No rash.  Neurological:     Mental Status: She is alert. Mental status is at baseline.  Psychiatric:        Mood and Affect: Mood normal.  Behavior: Behavior normal.        Lab Results  Component Value Date   WBC 5.5 05/31/2023   HGB 12.6 05/31/2023   HCT 38.4 05/31/2023   PLT 229.0 05/31/2023   GLUCOSE 81 05/31/2023   CHOL 145 05/31/2023   TRIG 71.0 05/31/2023   HDL 54.80 05/31/2023   LDLCALC 76 05/31/2023   ALT 18 05/31/2023   AST 20 05/31/2023   NA 139 05/31/2023   K 3.9 05/31/2023   CL 106 05/31/2023   CREATININE 0.67 05/31/2023   BUN 15 05/31/2023   CO2 26 05/31/2023   TSH 0.15 (L) 05/31/2023   HGBA1C 5.6 05/31/2023   EKG - NSR at 68 BPM, normal EKG. No change in EKG since 2020  Assessment & Plan:    See Problem List for Assessment and Plan of chronic medical problems.

## 2024-06-08 NOTE — Patient Instructions (Addendum)
      Blood work was ordered.       Medications changes include :   restart prozac  20 mg daily     Return in about 1 year (around 06/09/2025) for follow up.

## 2024-06-09 ENCOUNTER — Ambulatory Visit (INDEPENDENT_AMBULATORY_CARE_PROVIDER_SITE_OTHER): Admitting: Internal Medicine

## 2024-06-09 VITALS — BP 112/78 | HR 71 | Temp 98.4°F | Ht 65.0 in | Wt 212.0 lb

## 2024-06-09 DIAGNOSIS — F419 Anxiety disorder, unspecified: Secondary | ICD-10-CM | POA: Diagnosis not present

## 2024-06-09 DIAGNOSIS — E559 Vitamin D deficiency, unspecified: Secondary | ICD-10-CM | POA: Diagnosis not present

## 2024-06-09 DIAGNOSIS — R7303 Prediabetes: Secondary | ICD-10-CM | POA: Diagnosis not present

## 2024-06-09 DIAGNOSIS — R739 Hyperglycemia, unspecified: Secondary | ICD-10-CM | POA: Diagnosis not present

## 2024-06-09 DIAGNOSIS — M85852 Other specified disorders of bone density and structure, left thigh: Secondary | ICD-10-CM

## 2024-06-09 DIAGNOSIS — I2510 Atherosclerotic heart disease of native coronary artery without angina pectoris: Secondary | ICD-10-CM | POA: Diagnosis not present

## 2024-06-09 DIAGNOSIS — Z6835 Body mass index (BMI) 35.0-35.9, adult: Secondary | ICD-10-CM | POA: Insufficient documentation

## 2024-06-09 DIAGNOSIS — E890 Postprocedural hypothyroidism: Secondary | ICD-10-CM

## 2024-06-09 DIAGNOSIS — M62830 Muscle spasm of back: Secondary | ICD-10-CM | POA: Diagnosis not present

## 2024-06-09 DIAGNOSIS — H3530 Unspecified macular degeneration: Secondary | ICD-10-CM | POA: Insufficient documentation

## 2024-06-09 DIAGNOSIS — E66811 Obesity, class 1: Secondary | ICD-10-CM

## 2024-06-09 LAB — COMPREHENSIVE METABOLIC PANEL WITH GFR
ALT: 12 U/L (ref 0–35)
AST: 14 U/L (ref 0–37)
Albumin: 4.2 g/dL (ref 3.5–5.2)
Alkaline Phosphatase: 57 U/L (ref 39–117)
BUN: 15 mg/dL (ref 6–23)
CO2: 28 meq/L (ref 19–32)
Calcium: 9.6 mg/dL (ref 8.4–10.5)
Chloride: 102 meq/L (ref 96–112)
Creatinine, Ser: 0.71 mg/dL (ref 0.40–1.20)
GFR: 86.24 mL/min (ref >60.00)
Glucose, Bld: 80 mg/dL (ref 70–99)
Potassium: 4.1 meq/L (ref 3.5–5.1)
Sodium: 138 meq/L (ref 135–145)
Total Bilirubin: 0.6 mg/dL (ref 0.2–1.2)
Total Protein: 6.6 g/dL (ref 6.0–8.3)

## 2024-06-09 LAB — LIPID PANEL
Cholesterol: 191 mg/dL (ref 0–200)
HDL: 63.4 mg/dL (ref >39.00)
LDL Cholesterol: 111 mg/dL — ABNORMAL HIGH (ref 0–99)
NonHDL: 128
Total CHOL/HDL Ratio: 3
Triglycerides: 84 mg/dL (ref 0.0–149.0)
VLDL: 16.8 mg/dL (ref 0.0–40.0)

## 2024-06-09 LAB — CBC WITH DIFFERENTIAL/PLATELET
Basophils Absolute: 0 K/uL (ref 0.0–0.1)
Basophils Relative: 0.3 % (ref 0.0–3.0)
Eosinophils Absolute: 0.1 K/uL (ref 0.0–0.7)
Eosinophils Relative: 1.9 % (ref 0.0–5.0)
HCT: 40.9 % (ref 36.0–46.0)
Hemoglobin: 13.5 g/dL (ref 12.0–15.0)
Lymphocytes Relative: 43.4 % (ref 12.0–46.0)
Lymphs Abs: 2.4 K/uL (ref 0.7–4.0)
MCHC: 33.1 g/dL (ref 30.0–36.0)
MCV: 94.7 fl (ref 78.0–100.0)
Monocytes Absolute: 0.3 K/uL (ref 0.1–1.0)
Monocytes Relative: 5.5 % (ref 3.0–12.0)
Neutro Abs: 2.7 K/uL (ref 1.4–7.7)
Neutrophils Relative %: 48.9 % (ref 43.0–77.0)
Platelets: 219 K/uL (ref 150.0–400.0)
RBC: 4.32 Mil/uL (ref 3.87–5.11)
RDW: 14.4 % (ref 11.5–15.5)
WBC: 5.5 K/uL (ref 4.0–10.5)

## 2024-06-09 LAB — HEMOGLOBIN A1C: Hgb A1c MFr Bld: 5.8 % (ref 4.6–6.5)

## 2024-06-09 LAB — VITAMIN D 25 HYDROXY (VIT D DEFICIENCY, FRACTURES): VITD: 26.82 ng/mL — ABNORMAL LOW (ref 30.00–100.00)

## 2024-06-09 MED ORDER — FLUOXETINE HCL 20 MG PO TABS: 20.0000 mg | ORAL_TABLET | Freq: Every day | ORAL | 3 refills | Status: AC

## 2024-06-09 MED ORDER — LORAZEPAM 1 MG PO TABS: 1.0000 mg | ORAL_TABLET | Freq: Two times a day (BID) | ORAL | 0 refills | Status: DC | PRN

## 2024-06-09 MED ORDER — METHOCARBAMOL 750 MG PO TABS: 750.0000 mg | ORAL_TABLET | Freq: Three times a day (TID) | ORAL | 2 refills | Status: AC | PRN

## 2024-06-09 NOTE — Assessment & Plan Note (Addendum)
 Chronic S/p Total thyroidectomy-goiter Management per her integrative doctor On Armour Thyroid  60 mg daily

## 2024-06-09 NOTE — Assessment & Plan Note (Addendum)
 Chronic Intermittent Methocarbamol  500 mg - not strong enough Increase methocarbamol  to 750 mg every 8 hr during the day only as needed

## 2024-06-09 NOTE — Assessment & Plan Note (Addendum)
 Chronic BMI 35.28 w/ prediabetes, CAD Encouraged weight loss On semaglutide  injection via [facility]  Low sugar/carbohydrate diet, decrease portions

## 2024-06-09 NOTE — Assessment & Plan Note (Signed)
Chronic Last vitamin D level normal Encouraged vitamin D intake daily Check vitamin d level

## 2024-06-09 NOTE — Assessment & Plan Note (Addendum)
 Chronic Controlled, Stable Continue lorazepam  1 mg twice daily as needed Will restart prozac  20 mg daily

## 2024-06-09 NOTE — Assessment & Plan Note (Addendum)
 new Family history of diabetes Check a1c Low sugar / carb diet Stressed regular exercise Encouraged weight loss  Lab Results  Component Value Date   HGBA1C 5.6 05/31/2023

## 2024-06-09 NOTE — Assessment & Plan Note (Addendum)
 Chronic Mild, nonobstructive CT CAC 11/2023 was 34.6 Working on weight loss Stressed risk factor reduction-regular exercise, healthy diet, weight loss

## 2024-06-09 NOTE — Assessment & Plan Note (Signed)
 Chronic DEXA up-to-date Stressed regular exercise Stressed calcium, vitamin D  Check vitamin D  level

## 2024-06-12 ENCOUNTER — Ambulatory Visit: Payer: Self-pay | Admitting: Internal Medicine

## 2024-07-23 DIAGNOSIS — H353211 Exudative age-related macular degeneration, right eye, with active choroidal neovascularization: Secondary | ICD-10-CM | POA: Diagnosis not present

## 2024-07-23 DIAGNOSIS — H43813 Vitreous degeneration, bilateral: Secondary | ICD-10-CM | POA: Diagnosis not present

## 2024-07-23 DIAGNOSIS — H353121 Nonexudative age-related macular degeneration, left eye, early dry stage: Secondary | ICD-10-CM | POA: Diagnosis not present

## 2024-07-23 DIAGNOSIS — H2513 Age-related nuclear cataract, bilateral: Secondary | ICD-10-CM | POA: Diagnosis not present

## 2024-07-31 DIAGNOSIS — L814 Other melanin hyperpigmentation: Secondary | ICD-10-CM | POA: Diagnosis not present

## 2024-07-31 DIAGNOSIS — L819 Disorder of pigmentation, unspecified: Secondary | ICD-10-CM | POA: Diagnosis not present

## 2024-08-04 ENCOUNTER — Other Ambulatory Visit: Payer: Self-pay | Admitting: Internal Medicine

## 2024-10-06 ENCOUNTER — Telehealth: Payer: Self-pay

## 2024-10-06 NOTE — Telephone Encounter (Signed)
 Copied from CRM [id number]. Topic: Clinical - Medical Advice >> Oct 06, 2024  4:18 PM Terri MATSU wrote: Reason for CRM: Patient scheduled a bone density test on 2/23 and needs an order placed in for it. Can fax it to [phone]  Also patient wanted to let Dr.Burns know she is moving in March

## 2024-10-07 ENCOUNTER — Other Ambulatory Visit: Payer: Self-pay

## 2024-10-07 DIAGNOSIS — Z13820 Encounter for screening for osteoporosis: Secondary | ICD-10-CM

## 2024-10-07 DIAGNOSIS — M85852 Other specified disorders of bone density and structure, left thigh: Secondary | ICD-10-CM

## 2024-10-07 NOTE — Telephone Encounter (Signed)
"  Order faxed today  "

## 2024-10-13 ENCOUNTER — Telehealth: Payer: Self-pay

## 2024-10-13 DIAGNOSIS — I2510 Atherosclerotic heart disease of native coronary artery without angina pectoris: Secondary | ICD-10-CM

## 2024-10-13 NOTE — Telephone Encounter (Signed)
 Copied from CRM #[id number]. Topic: Clinical - Request for Lab/Test Order >> Oct 13, 2024  3:32 PM Berneda FALCON wrote: Reason for CRM: Patient states that her ortho PCP put her on Deloxicam and wanted her to follow up with bloodwork with PCP. Can we please put orders in there to follow up with this?  Patient callback is [phone] (home)

## 2024-10-13 NOTE — Addendum Note (Signed)
 Addended by: GEOFM GLADE PARAS on: 10/13/2024 09:06 PM   Modules accepted: Orders

## 2024-10-13 NOTE — Telephone Encounter (Signed)
 BMP ordered - no fasting needed

## 2024-12-16 ENCOUNTER — Ambulatory Visit

## 2025-06-12 ENCOUNTER — Encounter: Admitting: Internal Medicine
